# Patient Record
Sex: Male | Born: 1945
Health system: Southern US, Community
[De-identification: ages and names within clinical notes are randomized; demographics above are authoritative.]

## PROBLEM LIST (undated history)

## (undated) DIAGNOSIS — M1990 Unspecified osteoarthritis, unspecified site: Secondary | ICD-10-CM

## (undated) DIAGNOSIS — Z86010 Personal history of colonic polyps: Secondary | ICD-10-CM

## (undated) DIAGNOSIS — K5730 Diverticulosis of large intestine without perforation or abscess without bleeding: Secondary | ICD-10-CM

## (undated) DIAGNOSIS — E785 Hyperlipidemia, unspecified: Secondary | ICD-10-CM

## (undated) DIAGNOSIS — F528 Other sexual dysfunction not due to a substance or known physiological condition: Secondary | ICD-10-CM

## (undated) DIAGNOSIS — I10 Essential (primary) hypertension: Secondary | ICD-10-CM

## (undated) DIAGNOSIS — N400 Enlarged prostate without lower urinary tract symptoms: Secondary | ICD-10-CM

## (undated) HISTORY — DX: Personal history of colonic polyps: Z86.010

## (undated) HISTORY — DX: Other sexual dysfunction not due to a substance or known physiological condition: F52.8

## (undated) HISTORY — PX: CERVICAL SPINE SURGERY: SHX589

## (undated) HISTORY — PX: ROTATOR CUFF REPAIR: SHX139

## (undated) HISTORY — DX: Diverticulosis of large intestine without perforation or abscess without bleeding: K57.30

## (undated) HISTORY — PX: TEMPOROMANDIBULAR JOINT SURGERY: SHX35

## (undated) HISTORY — PX: VASECTOMY: SHX75

## (undated) HISTORY — DX: Unspecified osteoarthritis, unspecified site: M19.90

## (undated) HISTORY — DX: Hyperlipidemia, unspecified: E78.5

## (undated) HISTORY — PX: TONSILLECTOMY: SHX5217

## (undated) HISTORY — DX: Essential (primary) hypertension: I10

## (undated) HISTORY — DX: Benign prostatic hyperplasia without lower urinary tract symptoms: N40.0

---

## 2000-02-11 ENCOUNTER — Encounter (INDEPENDENT_AMBULATORY_CARE_PROVIDER_SITE_OTHER): Payer: Self-pay | Admitting: Specialist

## 2000-02-11 ENCOUNTER — Ambulatory Visit (HOSPITAL_COMMUNITY): Admission: RE | Admit: 2000-02-11 | Discharge: 2000-02-11 | Payer: Self-pay | Admitting: Gastroenterology

## 2001-08-05 ENCOUNTER — Ambulatory Visit (HOSPITAL_BASED_OUTPATIENT_CLINIC_OR_DEPARTMENT_OTHER): Admission: RE | Admit: 2001-08-05 | Discharge: 2001-08-06 | Payer: Self-pay | Admitting: Orthopedic Surgery

## 2004-01-25 ENCOUNTER — Ambulatory Visit (HOSPITAL_BASED_OUTPATIENT_CLINIC_OR_DEPARTMENT_OTHER): Admission: RE | Admit: 2004-01-25 | Discharge: 2004-01-25 | Payer: Self-pay | Admitting: Internal Medicine

## 2004-11-29 ENCOUNTER — Ambulatory Visit: Payer: Self-pay | Admitting: Internal Medicine

## 2004-12-10 ENCOUNTER — Ambulatory Visit: Payer: Self-pay | Admitting: Internal Medicine

## 2006-01-28 ENCOUNTER — Ambulatory Visit: Payer: Self-pay | Admitting: Internal Medicine

## 2006-01-28 LAB — CONVERTED CEMR LAB
BUN: 20 mg/dL (ref 6–23)
Basophils Absolute: 0 10*3/uL (ref 0.0–0.1)
CO2: 28 meq/L (ref 19–32)
Calcium: 9.2 mg/dL (ref 8.4–10.5)
Chloride: 109 meq/L (ref 96–112)
Chol/HDL Ratio, serum: 5.1
Cholesterol: 149 mg/dL (ref 0–200)
Creatinine, Ser: 1.1 mg/dL (ref 0.4–1.5)
Hemoglobin: 14.9 g/dL (ref 13.0–17.0)
LDL Cholesterol: 110 mg/dL — ABNORMAL HIGH (ref 0–99)
Lymphocytes Relative: 19.3 % (ref 12.0–46.0)
MCHC: 33.4 g/dL (ref 30.0–36.0)
MCV: 88.8 fL (ref 78.0–100.0)
Monocytes Absolute: 0.4 10*3/uL (ref 0.2–0.7)
Monocytes Relative: 8.8 % (ref 3.0–11.0)
Neutrophils Relative %: 70.3 % (ref 43.0–77.0)
Potassium: 4.4 meq/L (ref 3.5–5.1)
TSH: 2.38 microintl units/mL (ref 0.35–5.50)
Triglyceride fasting, serum: 48 mg/dL (ref 0–149)

## 2006-02-04 ENCOUNTER — Ambulatory Visit: Payer: Self-pay | Admitting: Internal Medicine

## 2006-04-22 ENCOUNTER — Ambulatory Visit: Payer: Self-pay | Admitting: Internal Medicine

## 2006-06-25 ENCOUNTER — Encounter: Payer: Self-pay | Admitting: Internal Medicine

## 2006-09-22 ENCOUNTER — Ambulatory Visit (HOSPITAL_COMMUNITY): Admission: RE | Admit: 2006-09-22 | Discharge: 2006-09-23 | Payer: Self-pay | Admitting: Neurological Surgery

## 2006-10-22 ENCOUNTER — Encounter: Payer: Self-pay | Admitting: Internal Medicine

## 2006-11-19 ENCOUNTER — Encounter: Payer: Self-pay | Admitting: Internal Medicine

## 2006-12-12 ENCOUNTER — Encounter: Payer: Self-pay | Admitting: Internal Medicine

## 2007-01-28 ENCOUNTER — Ambulatory Visit: Payer: Self-pay | Admitting: Internal Medicine

## 2007-01-28 LAB — CONVERTED CEMR LAB
Bilirubin, Direct: 0.2 mg/dL (ref 0.0–0.3)
CO2: 30 meq/L (ref 19–32)
Calcium: 9.2 mg/dL (ref 8.4–10.5)
Cholesterol: 143 mg/dL (ref 0–200)
Eosinophils Absolute: 0.1 10*3/uL (ref 0.0–0.6)
GFR calc Af Amer: 99 mL/min
GFR calc non Af Amer: 82 mL/min
Glucose, Bld: 82 mg/dL (ref 70–99)
HCT: 44.1 % (ref 39.0–52.0)
LDL Cholesterol: 109 mg/dL — ABNORMAL HIGH (ref 0–99)
MCHC: 33.8 g/dL (ref 30.0–36.0)
Monocytes Absolute: 0.5 10*3/uL (ref 0.2–0.7)
Monocytes Relative: 9.2 % (ref 3.0–11.0)
Neutro Abs: 3.2 10*3/uL (ref 1.4–7.7)
Neutrophils Relative %: 65.7 % (ref 43.0–77.0)
PSA: 1.87 ng/mL (ref 0.10–4.00)
Potassium: 4.5 meq/L (ref 3.5–5.1)
RBC: 5 M/uL (ref 4.22–5.81)
RDW: 12.5 % (ref 11.5–14.6)
Sodium: 147 meq/L — ABNORMAL HIGH (ref 135–145)
TSH: 2.17 microintl units/mL (ref 0.35–5.50)
Total CHOL/HDL Ratio: 6.1
Total Protein: 6.1 g/dL (ref 6.0–8.3)

## 2007-02-05 ENCOUNTER — Telehealth: Payer: Self-pay | Admitting: Internal Medicine

## 2007-02-06 DIAGNOSIS — Z860100 Personal history of colon polyps, unspecified: Secondary | ICD-10-CM

## 2007-02-06 DIAGNOSIS — M1990 Unspecified osteoarthritis, unspecified site: Secondary | ICD-10-CM

## 2007-02-06 DIAGNOSIS — K5730 Diverticulosis of large intestine without perforation or abscess without bleeding: Secondary | ICD-10-CM

## 2007-02-06 DIAGNOSIS — N400 Enlarged prostate without lower urinary tract symptoms: Secondary | ICD-10-CM

## 2007-02-06 HISTORY — DX: Benign prostatic hyperplasia without lower urinary tract symptoms: N40.0

## 2007-02-06 HISTORY — DX: Personal history of colonic polyps: Z86.010

## 2007-02-06 HISTORY — DX: Unspecified osteoarthritis, unspecified site: M19.90

## 2007-02-06 HISTORY — DX: Personal history of colon polyps, unspecified: Z86.0100

## 2007-02-06 HISTORY — DX: Diverticulosis of large intestine without perforation or abscess without bleeding: K57.30

## 2007-02-27 ENCOUNTER — Ambulatory Visit: Payer: Self-pay | Admitting: Internal Medicine

## 2007-06-04 ENCOUNTER — Telehealth: Payer: Self-pay | Admitting: Internal Medicine

## 2007-10-29 ENCOUNTER — Telehealth: Payer: Self-pay | Admitting: Internal Medicine

## 2007-12-22 ENCOUNTER — Telehealth: Payer: Self-pay | Admitting: Internal Medicine

## 2008-02-12 ENCOUNTER — Ambulatory Visit: Payer: Self-pay | Admitting: Internal Medicine

## 2008-02-12 LAB — CONVERTED CEMR LAB
ALT: 21 units/L (ref 0–53)
AST: 19 units/L (ref 0–37)
Albumin: 4 g/dL (ref 3.5–5.2)
BUN: 14 mg/dL (ref 6–23)
Basophils Relative: 0.1 % (ref 0.0–3.0)
Bilirubin Urine: NEGATIVE
CO2: 29 meq/L (ref 19–32)
Calcium: 8.8 mg/dL (ref 8.4–10.5)
Chloride: 106 meq/L (ref 96–112)
Cholesterol: 159 mg/dL (ref 0–200)
Creatinine, Ser: 1 mg/dL (ref 0.4–1.5)
Eosinophils Absolute: 0.1 10*3/uL (ref 0.0–0.7)
Eosinophils Relative: 2 % (ref 0.0–5.0)
Glucose, Urine, Semiquant: NEGATIVE
Hemoglobin: 14.7 g/dL (ref 13.0–17.0)
MCV: 87.9 fL (ref 78.0–100.0)
Neutro Abs: 4.2 10*3/uL (ref 1.4–7.7)
Neutrophils Relative %: 71.8 % (ref 43.0–77.0)
PSA: 2.41 ng/mL (ref 0.10–4.00)
Protein, U semiquant: NEGATIVE
RBC: 4.78 M/uL (ref 4.22–5.81)
Specific Gravity, Urine: 1.015
TSH: 2.93 microintl units/mL (ref 0.35–5.50)
VLDL: 20 mg/dL (ref 0–40)
WBC Urine, dipstick: NEGATIVE
WBC: 5.9 10*3/uL (ref 4.5–10.5)
pH: 6.5

## 2008-02-22 ENCOUNTER — Ambulatory Visit: Payer: Self-pay | Admitting: Internal Medicine

## 2008-02-22 DIAGNOSIS — E785 Hyperlipidemia, unspecified: Secondary | ICD-10-CM

## 2008-02-22 HISTORY — DX: Hyperlipidemia, unspecified: E78.5

## 2008-03-30 ENCOUNTER — Telehealth: Payer: Self-pay | Admitting: *Deleted

## 2008-03-31 ENCOUNTER — Telehealth: Payer: Self-pay | Admitting: Internal Medicine

## 2008-09-28 ENCOUNTER — Telehealth: Payer: Self-pay | Admitting: Internal Medicine

## 2009-02-02 ENCOUNTER — Telehealth: Payer: Self-pay | Admitting: Internal Medicine

## 2009-02-17 ENCOUNTER — Ambulatory Visit: Payer: Self-pay | Admitting: Internal Medicine

## 2009-02-17 LAB — CONVERTED CEMR LAB
ALT: 19 units/L (ref 0–53)
Basophils Relative: 0.5 % (ref 0.0–3.0)
Bilirubin, Direct: 0.1 mg/dL (ref 0.0–0.3)
Chloride: 108 meq/L (ref 96–112)
Creatinine, Ser: 1.1 mg/dL (ref 0.4–1.5)
Eosinophils Relative: 2 % (ref 0.0–5.0)
HCT: 40.6 % (ref 39.0–52.0)
Hemoglobin: 14 g/dL (ref 13.0–17.0)
LDL Cholesterol: 93 mg/dL (ref 0–99)
MCV: 90.6 fL (ref 78.0–100.0)
Monocytes Absolute: 0.3 10*3/uL (ref 0.1–1.0)
Neutrophils Relative %: 63.1 % (ref 43.0–77.0)
Nitrite: NEGATIVE
PSA: 0.75 ng/mL (ref 0.10–4.00)
Potassium: 3.6 meq/L (ref 3.5–5.1)
RBC: 4.48 M/uL (ref 4.22–5.81)
Sodium: 141 meq/L (ref 135–145)
Total CHOL/HDL Ratio: 4
Total Protein: 6.2 g/dL (ref 6.0–8.3)
Urobilinogen, UA: 0.2
WBC Urine, dipstick: NEGATIVE
WBC: 3.5 10*3/uL — ABNORMAL LOW (ref 4.5–10.5)

## 2009-02-23 ENCOUNTER — Ambulatory Visit: Payer: Self-pay | Admitting: Internal Medicine

## 2009-02-23 DIAGNOSIS — F528 Other sexual dysfunction not due to a substance or known physiological condition: Secondary | ICD-10-CM

## 2009-02-23 HISTORY — DX: Other sexual dysfunction not due to a substance or known physiological condition: F52.8

## 2009-03-08 ENCOUNTER — Encounter: Payer: Self-pay | Admitting: Internal Medicine

## 2009-06-13 ENCOUNTER — Telehealth: Payer: Self-pay | Admitting: Internal Medicine

## 2009-06-20 ENCOUNTER — Ambulatory Visit: Payer: Self-pay | Admitting: Internal Medicine

## 2009-06-20 DIAGNOSIS — J019 Acute sinusitis, unspecified: Secondary | ICD-10-CM

## 2010-02-05 ENCOUNTER — Telehealth: Payer: Self-pay | Admitting: Internal Medicine

## 2010-02-27 ENCOUNTER — Ambulatory Visit: Payer: Self-pay | Admitting: Internal Medicine

## 2010-02-27 LAB — CONVERTED CEMR LAB
ALT: 16 units/L (ref 0–53)
Alkaline Phosphatase: 43 units/L (ref 39–117)
Basophils Relative: 0.4 % (ref 0.0–3.0)
Bilirubin, Direct: 0.1 mg/dL (ref 0.0–0.3)
Blood in Urine, dipstick: NEGATIVE
Calcium: 8.3 mg/dL — ABNORMAL LOW (ref 8.4–10.5)
Creatinine, Ser: 1.1 mg/dL (ref 0.4–1.5)
Eosinophils Absolute: 0.1 10*3/uL (ref 0.0–0.7)
Eosinophils Relative: 2.2 % (ref 0.0–5.0)
HDL: 22.2 mg/dL — ABNORMAL LOW (ref >39.00)
Ketones, urine, test strip: NEGATIVE
LDL Cholesterol: 80 mg/dL (ref 0–99)
Lymphocytes Relative: 30.8 % (ref 12.0–46.0)
Neutrophils Relative %: 58.2 % (ref 43.0–77.0)
Nitrite: NEGATIVE
RBC: 4.73 M/uL (ref 4.22–5.81)
Total Bilirubin: 0.8 mg/dL (ref 0.3–1.2)
Total CHOL/HDL Ratio: 5
Total Protein: 6 g/dL (ref 6.0–8.3)
Triglycerides: 58 mg/dL (ref 0.0–149.0)
Urobilinogen, UA: 0.2
VLDL: 11.6 mg/dL (ref 0.0–40.0)
WBC: 4.1 10*3/uL — ABNORMAL LOW (ref 4.5–10.5)

## 2010-03-06 ENCOUNTER — Encounter: Payer: Self-pay | Admitting: Internal Medicine

## 2010-03-06 ENCOUNTER — Ambulatory Visit
Admission: RE | Admit: 2010-03-06 | Discharge: 2010-03-06 | Payer: Self-pay | Source: Home / Self Care | Attending: Internal Medicine | Admitting: Internal Medicine

## 2010-03-29 ENCOUNTER — Telehealth: Payer: Self-pay | Admitting: Internal Medicine

## 2010-04-03 NOTE — Letter (Signed)
Summary: [facility]  [facility]   Imported By: Maryln Gottron 08/18/2009 12:34:13  _____________________________________________________________________  External Attachment:    Type:   Image     Comment:   External Document

## 2010-04-03 NOTE — Medication Information (Signed)
Summary: Prior Authorization Request and Approval for Androgel/[facility]  Prior Authorization Request and Approval for Androgel/[facility]   Imported By: Maryln Gottron 03/14/2009 11:30:27  _____________________________________________________________________  External Attachment:    Type:   Image     Comment:   External Document

## 2010-04-03 NOTE — Progress Notes (Signed)
Summary: sinus infection - make appt  Phone Note Call from Patient Call back at [phone]   Caller: Patient----voicemail Summary of Call: Has a sinus infection x 1 month. Call relief in to [facility] on [address]. Initial call taken by: Warnell Forester,  June 13, 2009 2:23 PM  Follow-up for Phone Call        needs ROV to assess; OK for patient to try sudafed, nasal irrigation and short term afrin Follow-up by: Gordy Savers  MD,  June 13, 2009 5:21 PM  Additional Follow-up for Phone Call Additional follow up Details #1::        attempt to call - ans mach - LMTCB and make appt - will need to see - can use otc meds as indicated per Dr. Williams Che Additional Follow-up by: Duard Brady LPN,  June 14, 2009 9:50 AM

## 2010-04-03 NOTE — Progress Notes (Signed)
Summary: REQUEST FOR RX........?  Phone Note Call from Patient   Caller: Patient Summary of Call: Pt would like to have a Rx for Viagra sent to  [facility] [address]..... Pt can be reached at  [phone] with any questions or concerns.  Initial call taken by: Debbra Riding,  February 05, 2010 12:12 PM  Follow-up for Phone Call        viagra 100  6   RF 6  1/2-1 daily as directed Follow-up by: Gordy Savers  MD,  February 05, 2010 12:25 PM  Additional Follow-up for Phone Call Additional follow up Details #1::        change to med list done , new rx [facility]ed to [facility]. pt aware. KIK Additional Follow-up by: Duard Brady LPN,  February 05, 2010 3:22 PM    New/Updated Medications: VIAGRA 100 MG TABS (SILDENAFIL CITRATE) 1/2 to 1 tab once daily as directed Prescriptions: VIAGRA 100 MG TABS (SILDENAFIL CITRATE) 1/2 to 1 tab once daily as directed  #6 x 6   Entered by:   Duard Brady LPN   Authorized by:   Gordy Savers  MD   Signed by:   Duard Brady LPN on 36/64/4034   Method used:   Faxed to ...       [facility] [address]* (retail)       [address]       [place], [place]  [address]       Ph: [phone]       Fax: [phone]   RxID:   [id number]

## 2010-04-03 NOTE — Assessment & Plan Note (Signed)
Summary: sinuses//ccm   Vital Signs:  Patient profile:   65 year old male Weight:      185 pounds Temp:     98.7 degrees F oral BP sitting:   92 / 60  (left arm) Cuff size:   regular  Vitals Entered By: Duard Brady LPN (June 20, 2009 3:44 PM) CC: pt c/o achy, pain sinuses, tight cough Is Patient Diabetic? No   CC:  pt c/o achy, pain sinuses, and tight cough.  History of Present Illness: 65 year old patient who has a history of allergic rhinitis, dyslipidemia osteoarthritis and EDfor the past month.  His head significant sinus congestion, intermittent chest congestion, and productive cough.  He has had purulent sinus drainage, yielding green sputum yesterday.  He felt quite achy and feverish and felt unwell as been no documented fever.  He has been using fluticasone on a regular basis.  Denies any wheezing, shortness of breath or chest pain  Preventive Screening-Counseling & Management  Alcohol-Tobacco     Smoking Status: never  Allergies: 1)  ! Sulf-10  Past History:  Past Medical History: Reviewed history from 02/23/2009 and no changes required. Colonic polyps, hx of Osteoarthritis Benign prostatic hypertrophy Diverticulosis, colon Hyperlipidemia ED  Review of Systems       The patient complains of anorexia, hoarseness, and prolonged cough.  The patient denies fever, weight loss, weight gain, vision loss, decreased hearing, chest pain, syncope, dyspnea on exertion, peripheral edema, headaches, hemoptysis, abdominal pain, melena, hematochezia, severe indigestion/heartburn, hematuria, incontinence, genital sores, muscle weakness, suspicious skin lesions, transient blindness, difficulty walking, depression, unusual weight change, abnormal bleeding, enlarged lymph nodes, angioedema, breast masses, and testicular masses.    Physical Exam  General:  Well-developed,well-nourished,in no acute distress; alert,appropriate and cooperative throughout examination Head:   Normocephalic and atraumatic without obvious abnormalities. No apparent alopecia or balding. mild maxillary sinus tenderness Eyes:  No corneal or conjunctival inflammation noted. EOMI. Perrla. Funduscopic exam benign, without hemorrhages, exudates or papilledema. Vision grossly normal. Ears:  External ear exam shows no significant lesions or deformities.  Otoscopic examination reveals clear canals, tympanic membranes are intact bilaterally without bulging, retraction, inflammation or discharge. Hearing is grossly normal bilaterally. Mouth:  Oral mucosa and oropharynx without lesions or exudates.  Teeth in good repair. Lungs:  few scattered rhonchi Heart:  Normal rate and regular rhythm. S1 and S2 normal without gallop, murmur, click, rub or other extra sounds. Abdomen:  Bowel sounds positive,abdomen soft and non-tender without masses, organomegaly or hernias noted.   Impression & Recommendations:  Problem # 1:  SINUSITIS- ACUTE-NOS (ICD-461.9)  His updated medication list for this problem includes:    Fluticasone Propionate 50 Mcg/act Susp (Fluticasone propionate) ..... Use two times a day  Problem # 2:  ERECTILE DYSFUNCTION, NON-ORGANIC, MILD (ICD-302.72)  His updated medication list for this problem includes:    Cialis 2.5 Mg Tabs (Tadalafil) ..... One tablet daily    Cialis 20 Mg Tabs (Tadalafil) ..... One daily as directed  Problem # 3:  OSTEOARTHRITIS (ICD-715.90)  His updated medication list for this problem includes:    Naproxen Dr 500 Mg Tbec (Naproxen) .Marland Kitchen... 1 two times a day  Complete Medication List: 1)  Fluticasone Propionate 50 Mcg/act Susp (Fluticasone propionate) .... Use two times a day 2)  Prozac 20 Mg Caps (Fluoxetine hcl) .Marland Kitchen.. 1 once daily 3)  Flomax 0.4 Mg Cp24 (Tamsulosin hcl) .Marland Kitchen.. 1 once daily 4)  Naproxen Dr 500 Mg Tbec (Naproxen) .Marland Kitchen.. 1 two times a day 5)  Niaspan 1000 Mg Cr-tabs (Niacin (antihyperlipidemic)) .... One at bedtime 6)  Cialis 2.5 Mg Tabs  (Tadalafil) .... One tablet daily 7)  Androgel 50 Mg/5gm Gel (Testosterone) .... Use q am 8)  Cialis 20 Mg Tabs (Tadalafil) .... One daily as directed  Patient Instructions: 1)  MUCINEX D one every 12 hours 2)  AVELOX ONE DAILY 3)  Get plenty of rest, drink lots of clear liquids, and use Tylenol or Ibuprofen for fever and comfort. Return in 7-10 days if you're not better:sooner if you're feeling worse. Prescriptions: CIALIS 2.5 MG TABS (TADALAFIL) one tablet daily  #90 x 6   Entered and Authorized by:   Gordy Savers  MD   Signed by:   Gordy Savers  MD on 06/20/2009   Method used:   Print then Give to Patient   RxID:   [id number] CIALIS 20 MG TABS (TADALAFIL) one daily as directed  #12 x 12   Entered and Authorized by:   Gordy Savers  MD   Signed by:   Gordy Savers  MD on 06/20/2009   Method used:   Print then Give to Patient   RxID:   [id number] CIALIS 20 MG TABS (TADALAFIL) one daily as directed  #12 x 12   Entered and Authorized by:   Gordy Savers  MD   Signed by:   Gordy Savers  MD on 06/20/2009   Method used:   Electronically to        [facility]* (mail-order)             ,          Ph: [phone]       Fax: [phone]   RxID:   [id number] CIALIS 2.5 MG TABS (TADALAFIL) one tablet daily  #90 x 6   Entered and Authorized by:   Gordy Savers  MD   Signed by:   Gordy Savers  MD on 06/20/2009   Method used:   Electronically to        [facility]* (mail-order)             ,          Ph: [phone]       Fax: [phone]   RxID:   [id number]

## 2010-04-05 NOTE — Assessment & Plan Note (Signed)
Summary: CPX // RS   Vital Signs:  Patient profile:   66 year old male Height:      68 inches Weight:      185 pounds BMI:     28.23 O2 Sat:      97 % on Room air Temp:     98.9 degrees F oral Pulse (ortho):   80 / minute BP sitting:   122 / 80  (left arm) Cuff size:   regular  Vitals Entered By: Duard Brady LPN (March 06, 2010 1:54 PM)  O2 Flow:  Room air CC: cpx - doing well - DOT cpx ppwk Is Patient Diabetic? No  Vision Screening:Left eye with correction: 20 / 20 Right eye with correction: 20 / 20 Both eyes with correction: 20 / 20        Vision Entered By: Duard Brady LPN (March 06, 2010 2:05 PM)   CC:  cpx - doing well - DOT cpx ppwk.  History of Present Illness:  65 year old patient who is seen today for a health-maintenance examination and DOT physical.  He is doing quite well.  He has a history of obscure Ronis BPH mild dyslipidemia.  He has erectile dysfunction.  No new concerns or complaints.  Allergies: 1)  ! Sulf-10  Past History:  Past Medical History: Reviewed history from 02/23/2009 and no changes required. Colonic polyps, hx of Osteoarthritis Benign prostatic hypertrophy Diverticulosis, colon Hyperlipidemia ED  Past Surgical History: Reviewed history from 02/22/2008 and no changes required. Rotator cuff repair 2003 Dr. Eulah Pont Tonsillectomy Vasectomy reversal TMJ surg x2 status post the cervical spine surgery July 2008 C5-6 level  Dr. Danielle Dess  colonoscopy April 2008  Family History: Reviewed history from 02/22/2008 and no changes required. father died age 69, lung cancer mother died within the past month at age 41 of heart failure.  History of COPD, status post pacemaker  One brother is in good health  Social History: Reviewed history from 02/23/2009 and no changes required. Married One daughter, 4 sons.  Review of Systems  The patient denies anorexia, fever, weight loss, weight gain, vision loss, decreased  hearing, hoarseness, chest pain, syncope, dyspnea on exertion, peripheral edema, prolonged cough, headaches, hemoptysis, abdominal pain, melena, hematochezia, severe indigestion/heartburn, hematuria, incontinence, genital sores, muscle weakness, suspicious skin lesions, transient blindness, difficulty walking, depression, unusual weight change, abnormal bleeding, enlarged lymph nodes, angioedema, breast masses, and testicular masses.    Physical Exam  General:  Well-developed,well-nourished,in no acute distress; alert,appropriate and cooperative throughout examination Head:  Normocephalic and atraumatic without obvious abnormalities. No apparent alopecia or balding. Eyes:  No corneal or conjunctival inflammation noted. EOMI. Perrla. Funduscopic exam benign, without hemorrhages, exudates or papilledema. Vision grossly normal. Nose:  External nasal examination shows no deformity or inflammation. Nasal mucosa are pink and moist without lesions or exudates. Mouth:  Oral mucosa and oropharynx without lesions or exudates.  Teeth in good repair. Neck:  No deformities, masses, or tenderness noted. Chest Wall:  No deformities, masses, tenderness or gynecomastia noted. Breasts:  No masses or gynecomastia noted Lungs:  Normal respiratory effort, chest expands symmetrically. Lungs are clear to auscultation, no crackles or wheezes. Heart:  Normal rate and regular rhythm. S1 and S2 normal without gallop, murmur, click, rub or other extra sounds. Abdomen:  Bowel sounds positive,abdomen soft and non-tender without masses, organomegaly or hernias noted. Genitalia:  Testes bilaterally descended without nodularity, tenderness or masses. No scrotal masses or lesions. No penis lesions or urethral discharge. Prostate:  2+  enlarged.  2+ enlarged.   Msk:  No deformity or scoliosis noted of thoracic or lumbar spine.   Pulses:  R and L carotid,radial,femoral,dorsalis pedis and posterior tibial pulses are full and equal  bilaterally Extremities:  No clubbing, cyanosis, edema, or deformity noted with normal full range of motion of all joints.   Neurologic:  No cranial nerve deficits noted. Station and gait are normal. Plantar reflexes are down-going bilaterally. DTRs are symmetrical throughout. Sensory, motor and coordinative functions appear intact. Cervical Nodes:  No lymphadenopathy noted Axillary Nodes:  No palpable lymphadenopathy Inguinal Nodes:  No significant adenopathy Psych:  Cognition and judgment appear intact. Alert and cooperative with normal attention span and concentration. No apparent delusions, illusions, hallucinations   Impression & Recommendations:  Problem # 1:  PREVENTIVE HEALTH CARE (ICD-V70.0)  Orders: EKG w/ Interpretation (93000)  Complete Medication List: 1)  Fluticasone Propionate 50 Mcg/act Susp (Fluticasone propionate) .... Use two times a day 2)  Prozac 20 Mg Caps (Fluoxetine hcl) .Marland Kitchen.. 1 once daily 3)  Flomax 0.4 Mg Cp24 (Tamsulosin hcl) .Marland Kitchen.. 1 once daily 4)  Naproxen Dr 500 Mg Tbec (Naproxen) .Marland Kitchen.. 1 two times a day 5)  Niaspan 1000 Mg Cr-tabs (Niacin (antihyperlipidemic)) .... One at bedtime 6)  Androgel 50 Mg/5gm Gel (Testosterone) .... Use q am 7)  Viagra 100 Mg Tabs (Sildenafil citrate) .... 1/2 to 1 tab once daily as directed  Patient Instructions: 1)  Please schedule a follow-up appointment in 1 year. 2)  Limit your Sodium (Salt). 3)  It is important that you exercise regularly at least 20 minutes 5 times a week. If you develop chest pain, have severe difficulty breathing, or feel very tired , stop exercising immediately and seek medical attention. Prescriptions: VIAGRA 100 MG TABS (SILDENAFIL CITRATE) 1/2 to 1 tab once daily as directed  #6 x 6   Entered and Authorized by:   Gordy Savers  MD   Signed by:   Gordy Savers  MD on 03/06/2010   Method used:   Electronically to        [facility]* (retail)             ,          Ph: [phone]        Fax: [phone]   RxID:   [id number] NIASPAN 1000 MG CR-TABS (NIACIN (ANTIHYPERLIPIDEMIC)) one at bedtime  #90 x 6   Entered and Authorized by:   Gordy Savers  MD   Signed by:   Gordy Savers  MD on 03/06/2010   Method used:   Electronically to        [facility]* (retail)             ,          Ph: [phone]       Fax: [phone]   RxID:   [id number] FLOMAX 0.4 MG  CP24 (TAMSULOSIN HCL) 1 once daily  #90 x 6   Entered and Authorized by:   Gordy Savers  MD   Signed by:   Gordy Savers  MD on 03/06/2010   Method used:   Electronically to        [facility]* (retail)             ,          Ph: [phone]       Fax: [phone]   RxID:   [id number] PROZAC 20 MG  CAPS (FLUOXETINE  HCL) 1 once daily  #90 x 6   Entered and Authorized by:   Gordy Savers  MD   Signed by:   Gordy Savers  MD on 03/06/2010   Method used:   Electronically to        [facility]* (retail)             ,          Ph: [phone]       Fax: [phone]   RxID:   [id number] FLUTICASONE PROPIONATE 50 MCG/ACT SUSP (FLUTICASONE PROPIONATE) use two times a day  #3 x 6   Entered and Authorized by:   Gordy Savers  MD   Signed by:   Gordy Savers  MD on 03/06/2010   Method used:   Electronically to        [facility]* (retail)             ,          Ph: [phone]       Fax: [phone]   RxID:   [id number] VIAGRA 100 MG TABS (SILDENAFIL CITRATE) 1/2 to 1 tab once daily as directed  #6 x 6   Entered and Authorized by:   Gordy Savers  MD   Signed by:   Gordy Savers  MD on 03/06/2010   Method used:   Print then Give to Patient   RxID:   [id number] ANDROGEL 50 MG/5GM GEL (TESTOSTERONE) use q am  #30 x 4   Entered and Authorized by:   Gordy Savers  MD   Signed by:   Gordy Savers  MD on 03/06/2010   Method used:   Print then Give to Patient   RxID:   [id number] NIASPAN  1000 MG CR-TABS (NIACIN (ANTIHYPERLIPIDEMIC)) one at bedtime  #90 x 6   Entered and Authorized by:   Gordy Savers  MD   Signed by:   Gordy Savers  MD on 03/06/2010   Method used:   Print then Give to Patient   RxID:   [id number] FLOMAX 0.4 MG  CP24 (TAMSULOSIN HCL) 1 once daily  #90 x 6   Entered and Authorized by:   Gordy Savers  MD   Signed by:   Gordy Savers  MD on 03/06/2010   Method used:   Print then Give to Patient   RxID:   [id number] PROZAC 20 MG  CAPS (FLUOXETINE HCL) 1 once daily  #90 x 6   Entered and Authorized by:   Gordy Savers  MD   Signed by:   Gordy Savers  MD on 03/06/2010   Method used:   Print then Give to Patient   RxID:   [id number] FLUTICASONE PROPIONATE 50 MCG/ACT SUSP (FLUTICASONE PROPIONATE) use two times a day  #3 x 6   Entered and Authorized by:   Gordy Savers  MD   Signed by:   Gordy Savers  MD on 03/06/2010   Method used:   Print then Give to Patient   RxID:   [id number]  A1 no and a, and a and is a we are in.  No and in no and a review of and and a half and a one and and a low of in length and the in the  Orders Added: 1)  EKG w/ Interpretation [93000] 2)  Est. Patient 40-64 years [phone]

## 2010-04-05 NOTE — Progress Notes (Signed)
Summary: 90 day rx's for androgel,cialis  Phone Note Call from Patient   Caller: Patient Call For: Harold Savers  MD Summary of Call: needs 90 day rx for andorgel pump 1/62% and cialis , for mail order  call when ready , will pick [phone] Initial call taken by: Duard Brady LPN,  March 29, 2010 2:33 PM  Follow-up for Phone Call        pt aware ready for pick up   KIK Follow-up by: Duard Brady LPN,  March 30, 2010 8:24 AM    New/Updated Medications: ANDROGEL PUMP 20.25 MG/ACT (1.62%) GEL (TESTOSTERONE) 3 pumps daily CIALIS 20 MG TABS (TADALAFIL) one daily as needed Prescriptions: CIALIS 20 MG TABS (TADALAFIL) one daily as needed  #12 x 6   Entered and Authorized by:   Harold Savers  MD   Signed by:   Harold Savers  MD on 03/29/2010   Method used:   Print then Give to Patient   RxID:   [id number] ANDROGEL PUMP 20.25 MG/ACT (1.62%) GEL (TESTOSTERONE) 3 pumps daily  #3 x 6   Entered and Authorized by:   Harold Savers  MD   Signed by:   Harold Savers  MD on 03/29/2010   Method used:   Print then Give to Patient   RxID:   [id number]

## 2010-04-10 ENCOUNTER — Telehealth: Payer: Self-pay | Admitting: *Deleted

## 2010-04-10 NOTE — Telephone Encounter (Signed)
Given pt Dr Charm Rings recommendations.

## 2010-04-10 NOTE — Telephone Encounter (Signed)
Get plenty of rest, Drink lots of  clear liquids, and use Tylenol or ibuprofen for fever and discomfort.  Suggest Mucinex D; ROV prior to weekend if no improvement

## 2010-04-10 NOTE — Telephone Encounter (Signed)
Pt is asking for a RX for sinus drainage........green and congestion.

## 2010-07-17 NOTE — Op Note (Signed)
NAME:  Harold Wilson, Harold Wilson NO.:  [id number]   MEDICAL RECORD NO.:  [id number]          PATIENT TYPE:  INP   LOCATION:  3172                         FACILITY:  [facility]   PHYSICIAN:  Stefani Dama, M.D.  DATE OF BIRTH:  1945/08/11   DATE OF PROCEDURE:  09/22/2006  DATE OF DISCHARGE:                               OPERATIVE REPORT   PREOPERATIVE DIAGNOSIS:  Cervical spondylosis, with myelopathy and  radiculopathy, C4-5 and C5-6.   POSTOPERATIVE DIAGNOSIS:  Cervical spondylosis, with myelopathy and  radiculopathy, C4-5 and C5-6.   PROCEDURE:  Anterior cervical decompression C4-5 and C5-6, arthrodesis  with structural allograft, alpha type plate fixation.   SURGEON:  Stefani Dama, M.D.   FIRST ASSISTANT:  Hewitt Shorts, M.D.   ANESTHESIA:  General endotracheal.   INDICATIONS:  Harold Wilson is a 65 year old individual who has had  significant neck, shoulder, and arm pain, with weakness on the left  side.  He has severe spondylitic disease at multiple levels, the worst  of which are at C4-5 and C5-6.  He has been advised regarding surgical  decompression and arthrodesis of these two levels.   PROCEDURE:  The patient brought to the operating room and placed on the  table in the supine position.  After smooth induction of general  tracheal anesthesia, he was placed in 5 pounds of halter traction.  The  neck was scrubbed with alcohol and DuraPrep and draped in a sterile  fashion.  A transverse incision was made on the left side of the neck  and carried down to the platysma.  The plane between sternocleidomastoid  and the strap muscles was dissected bluntly, and the first identifiable  disc space was noted be that of C3-4 on a radiograph.  C4-5 and C5-6  were then uncovered using bipolar cautery.  The longus coli muscle was  stripped off either side of the midline, and a self-retaining retractor  was placed in the wound.  The disc space at C4-5 was then opened  by  removing large ventral osteophytes using a combination of curettes and  rongeurs.  The disc space was similarly then evacuated using curettes  and rongeurs, and a high-speed drill was used then to drill off the  inferior osteophytes from the body of C4 and uncinate process  hypertrophy from the superior portion of the body of C5.  The lateral  recesses were decompressed, and the central canal was decompressed, and  the posterior longitudinal ligament was taken up with a 2 mm Kerrison  punch.  This completed the decompression at this level.  Hemostasis was  achieved in the lateral tissues.  C5-6 was decompressed in a similar  fashion, and again prominent uncinate hypertrophy with redundant disc  material and thickened posterior longitudinal ligament was encountered  centrally.  Once this area was decompressed, an 8 mm transgraft was  shaped and sized to the appropriate dimensions using a high-speed bur.  It was filled with demineralized bone matrix and then placed into the  interspace at C5-6 first.  C4-5 was then fitted with a similar-sized  allograft with  demineralized bone matrix also.  A 37 mm standard size  Alphatec plate was then fitted with fixed-angle locking 4 x 14 mm screws  at C5, variable-angle screws at C4 and C6.  Final localizing radiograph  identified good position of the construct and the graft.  The wound was  checked for hemostasis.  Once  this was verified, then the platysma was closed 3-0 Vicryl in an  interrupted fashion.  3-0 Vicryl was used in the subcuticular tissues,  and a dry sterile dressing was placed on the skin.  The patient  tolerated the procedure well and was returned to the recovery in stable  condition.      Stefani Dama, M.D.  Electronically Signed     HJE/MEDQ  D:  09/22/2006  T:  09/22/2006  Job:  [id number]

## 2010-07-20 NOTE — Op Note (Signed)
[place]  Patient:    Harold Wilson, Harold Wilson Visit Number: [id number] MRN: [id number]          Service Type: DSU Location: [facility] Attending Physician:  Colbert Ewing Dictated by:   Loreta Ave, M.D. Proc. Date: 08/05/01 Admit Date:  08/05/2001 Discharge Date: 08/05/2001                             Operative Report  PREOPERATIVE DIAGNOSES:  Left shoulder chronic impingement, rotator cuff tear, longitudinal tearing biceps tendon with subluxation of bicipital groove. Degenerative joint disease of the acromioclavicular joint.  POSTOPERATIVE DIAGNOSES:  Left shoulder chronic impingement, rotator cuff tear, longitudinal tearing biceps tendon with subluxation of bicipital groove. Degenerative joint disease of the acromioclavicular joint.  OPERATIVE PROCEDURES:  Left shoulder examination under anesthesia arthroscopy with debridement of intraarticular portion of the biceps tendon.  Subacromial arthroscopic decompression with bursectomy.  Open repair of rotator cuff tear and interval tear.  __ repair biceps tendon subluxation with bioabsorbable anchor at bicipital groove.  Excision of distal clavicle.  SURGEON:  Loreta Ave, M.D.  ASSISTANT:  Arlys John D. Petrarca, P.A.-C.  ANESTHESIA:  General.  BLOOD LOSS:  Minimal.  SPECIMENS:  None.  CULTURES:  None.  COMPLICATIONS:  None.  DRESSING:  Soft compressive with shoulder immobilizer.  DESCRIPTION OF PROCEDURE:  The patient was brought to the operating room, placed on the operating table in supine position.  After adequate anesthesia had been obtained, the left shoulder was examined.  Full motion, good stability.  Placed in a beach chair position, prepped and draped in usual sterile fashion.  Three portals were created anterior, posterolateral, standard arthroscopic portals.  The shoulder was entered with a blunt obturator, distended, and inspected.  Articular cartilage, labrum,  looked good.  Biceps tendon was intact but with marked longitudinal tearing compromising a third of its integrity over the entire distance.  Subluxed out of the bicipital groove medially from soft tissue tearing at the anchor in front of the groove.  Interval tearing between the subscap tendon and supraspinatus from the lateral attachment all the way over to the glenoid. Attachment tearing supraspinatus tendon behind the bicipital groove over the anterior half in the crescent region.  Capsule and ligamentous structures were intact.  Cannula was redirected subacromially.  The full-thickness tear was appreciated.  Type 2-3 acromion.  Bursa was resected, cuff debrided. Acromioplasty to a type I acromion with the shaver and high-speed bur releasing the CA ligament.  Distal clavicle contributing to impingement with grade 4 changes.  Lateral cm sharply resected with shaver and high-speed bur. Adequacy of acromioplasty and decompression and clavicle excision confirmed viewing from all portals.  Instruments and fluid removed.  Deltoid splitting incision through the lateral portal.  Subacromial space accessed after the deltoid was split staying above the axillary nerve.  The interval tearing was repaired with Vicryl.  Bicipital groove was exposed.  Bioabsorbable anchor was placed at the anterior aspect of this.  Sutures were brought through the cuff and below firmly anchoring that down so that the tendon would not sublux. Tendon was inspected and although somewhat compromised, had enough integrity to retain this.  I then freshened up the margin of the supraspinatus tendon behind the bicipital groove.  Well captured with fiberwire suture.  Sewn down into a bony trough with drill holes placed through the humerus with a [employer].  Once these two were completed,  we then oversewed with tissue over the bicipital groove.  At completion, I had nice anchoring of the biceps within the groove  without subluxation.  Nice watertight closure of the cuff without undue tension when brought through full motion.  Adequacy of decompression confirmed digitally. The wound was irrigated.   Deltoid closed with Vicryl.  Skin and subcutaneous tissue with Vicryl.  Margins of the wound injected with Marcaine.  Portals closed with nylon and injected with Marcaine.  Sterile compressive dressing and shoulder immobilizer was applied. Anesthesia reversed, brought to recovery room.  Tolerated surgery well, no complications.Dictated by:   Loreta Ave, M.D. Attending Physician:  Colbert Ewing DD:  08/05/01 TD:  08/07/01 Job: [id number]

## 2010-07-20 NOTE — Assessment & Plan Note (Signed)
[facility]                            [place] OFFICE NOTE   NAME:Harold Wilson, Ausburn                      MRN:          [id number]  DATE:02/04/2006                            DOB:          11-13-1945    The patient is seen today for a wellness exam.   This 65 year old gentleman has a history of DJD, BPH.  He was diagnosed  with colonic polyps approximately 5 years ago, and is scheduled for  followup colonoscopy.  He is doing well today.  He has had a nice  benefit with Flomax.   OPERATIONS:  1. Rotator cuff surgery bilaterally.  2. A vasectomy reversal.  3. TMJ surgery x2.  4. A remote tonsillectomy.   MEDICAL REGIMEN:  1. Naproxen.  2. Prozac.  3. Flomax.  4. He was followed by Dr. Evelene Croon recently and is on Adderall.   FAMILY HISTORY:  Noncontributory.  Father died of lung cancer at 81.  One brother remains well.  Mother with a history of COPD, status post  pacemaker.   PHYSICAL EXAMINATION:  GENERAL:  A well-developed male in no acute  distress.  VITAL SIGNS:  Repeat blood pressure is 130/80, was high on arrival, and  usually is in a low normal range.  SKIN:  Negative, except for some areas of folliculitis over the  abdominal wall.  HEAD/NECK:  Normal fundi.  Ears, nose and throat clear.  Neck with no  bruits or adenopathy.  CHEST:  Clear.  CARDIOVASCULAR:  Normal heart sounds, no murmurs.  ABDOMEN:  Benign.  EXTERNAL GENITALIA:  Normal.  EXTREMITIES:  Negative with intact peripheral pulses.  RECTAL:  Prostate +2 to 3 enlarged and benign.  Stool heme negative.  NEUROLOGIC:  Negative.   IMPRESSION:  1. Degenerative joint disease.  2. Benign prostatic hypertrophy.   DISPOSITION:  His DOC forms were completed.  All his medicines were  refilled.  Will recheck in 6-12 months.     Gordy Savers, MD  Electronically Signed    PFK/MedQ  DD: 02/04/2006  DT: 02/05/2006  Job #: [id number]

## 2010-07-20 NOTE — Procedures (Signed)
NAME:  Harold Wilson, Harold Wilson NO.:  [id number]   MEDICAL RECORD NO.:  [id number]          PATIENT TYPE:  OUT   LOCATION:  SLEEP CENTER                 FACILITY:  [facility]   PHYSICIAN:  Clinton D. Maple Hudson, M.D. DATE OF BIRTH:  1945/09/10   DATE OF STUDY:                              NOCTURNAL POLYSOMNOGRAM   INDICATION FOR STUDY:  Hypersomnia with sleep apnea.  Epsworth sleepiness  score 15/24.  BMI 27.  Weight 195 pounds.   SLEEP ARCHITECTURE:  Total sleep time 383 minutes with sleep deficiency 92%.  Stage 1 was 6%, stage 2 was 81% and stages 3 and 4 were absent.  REM was 13%  of total sleep.  Sleep 10.5 minutes.  REM latency 154 minutes.  Awake after  sleep onset 25 minutes.  Arousal index 32.7.   NPSG protocol RDI 12.8 per hour indicating mild obstruction sleep  apnea/hypopnea syndrome.  This reflected 42 obstructive apneas and 40  hypopneas.  Most events were while sleeping supine.  REM RDI 15.9 per hour.   OXYGEN DATA:  Moderate to loud snoring with oxygen desaturation to a nadir  of 83%.  Mean oxygen saturation through the study was 94% on room air.   CARDIAC DATA:  Normal cardiac rhythm.   MOVEMENT/PARASOMNIA:  Occasional limp jerks with few causing any sleep  disturbance.   IMPRESSION/RECOMMENDATION:  Mild obstructive sleep apnea/hypopnea syndrome,  RDI 12.8 per hour with oxygen desaturation to 83%.                                                           Clinton D. Maple Hudson, M.D.  Diplomate, [employer] Board   CDY/MEDQ  D:  01/29/2004 15:58:10  T:  01/29/2004 16:20:53  Job:  [id number]

## 2010-08-21 ENCOUNTER — Other Ambulatory Visit: Payer: Self-pay | Admitting: Internal Medicine

## 2010-10-05 ENCOUNTER — Other Ambulatory Visit: Payer: Self-pay | Admitting: *Deleted

## 2010-10-05 MED ORDER — TESTOSTERONE 20.25 MG/ACT (1.62%) TD GEL: 3.0000 | TRANSDERMAL | Status: DC

## 2010-11-30 ENCOUNTER — Telehealth: Payer: Self-pay | Admitting: Internal Medicine

## 2010-11-30 NOTE — Telephone Encounter (Signed)
Returning a response to Dr Kirtland Bouchard regarding who prescribed Celebrex. Dr Pati Gallo prescribed Celebrex. Naproxen is a duplicate therapy and cannot be used  At the same time. Please return [facility]'s call thanks.

## 2010-11-30 NOTE — Telephone Encounter (Signed)
We okay's 08/21/10 #180 2RF Naproxen  Please advise Dr. Farris Has rx'd celebrex

## 2010-12-17 LAB — BASIC METABOLIC PANEL
CO2: 28
Calcium: 8.7
Chloride: 108
Glucose, Bld: 72
Sodium: 142

## 2010-12-17 LAB — CBC
Hemoglobin: 14.3
MCHC: 34.3
MCV: 85.5
RDW: 13

## 2011-04-16 ENCOUNTER — Other Ambulatory Visit: Payer: Self-pay

## 2011-04-16 MED ORDER — TESTOSTERONE 20.25 MG/ACT (1.62%) TD GEL: 3.0000 | Freq: Every day | TRANSDERMAL | Status: DC

## 2011-04-18 ENCOUNTER — Telehealth: Payer: Self-pay | Admitting: Internal Medicine

## 2011-04-18 NOTE — Telephone Encounter (Signed)
5 mg  #90 

## 2011-04-18 NOTE — Telephone Encounter (Signed)
Patient is asking for a 90 day supply of cialis. Please assist and inform patient.

## 2011-04-18 NOTE — Telephone Encounter (Signed)
Last seen 03/2010 for cpx- please advise

## 2011-04-19 NOTE — Telephone Encounter (Signed)
Attempt to call- VM - LMTCB , need to know what pharmacy to sent 90day rx to.

## 2011-05-03 ENCOUNTER — Other Ambulatory Visit (INDEPENDENT_AMBULATORY_CARE_PROVIDER_SITE_OTHER): Payer: BC Managed Care – PPO

## 2011-05-03 DIAGNOSIS — Z0000 Encounter for general adult medical examination without abnormal findings: Secondary | ICD-10-CM

## 2011-05-03 LAB — CBC WITH DIFFERENTIAL/PLATELET
Basophils Absolute: 0 10*3/uL (ref 0.0–0.1)
Eosinophils Relative: 2.5 % (ref 0.0–5.0)
HCT: 43.4 % (ref 39.0–52.0)
Lymphocytes Relative: 28.2 % (ref 12.0–46.0)
Monocytes Relative: 9.8 % (ref 3.0–12.0)
Neutrophils Relative %: 58.7 % (ref 43.0–77.0)
Platelets: 135 10*3/uL — ABNORMAL LOW (ref 150.0–400.0)
RDW: 13.5 % (ref 11.5–14.6)
WBC: 3.7 10*3/uL — ABNORMAL LOW (ref 4.5–10.5)

## 2011-05-03 LAB — POCT URINALYSIS DIPSTICK
Ketones, UA: NEGATIVE
Leukocytes, UA: NEGATIVE
Protein, UA: NEGATIVE
Spec Grav, UA: 1.01
Urobilinogen, UA: 0.2
pH, UA: 7

## 2011-05-03 LAB — TSH: TSH: 1.73 u[IU]/mL (ref 0.35–5.50)

## 2011-05-03 LAB — BASIC METABOLIC PANEL
BUN: 16 mg/dL (ref 6–23)
Calcium: 8.9 mg/dL (ref 8.4–10.5)
Creatinine, Ser: 1.2 mg/dL (ref 0.4–1.5)
GFR: 62.54 mL/min (ref >60.00)
Glucose, Bld: 79 mg/dL (ref 70–99)
Potassium: 4.3 mEq/L (ref 3.5–5.1)

## 2011-05-03 LAB — PSA: PSA: 1.2 ng/mL (ref 0.10–4.00)

## 2011-05-03 LAB — LIPID PANEL
HDL: 34 mg/dL — ABNORMAL LOW (ref >39.00)
LDL Cholesterol: 94 mg/dL (ref 0–99)
VLDL: 13 mg/dL (ref 0.0–40.0)

## 2011-05-03 LAB — HEPATIC FUNCTION PANEL
ALT: 19 U/L (ref 0–53)
AST: 20 U/L (ref 0–37)
Bilirubin, Direct: 0.2 mg/dL (ref 0.0–0.3)
Total Bilirubin: 1.1 mg/dL (ref 0.3–1.2)

## 2011-05-06 ENCOUNTER — Other Ambulatory Visit: Payer: Self-pay | Admitting: Internal Medicine

## 2011-05-09 ENCOUNTER — Encounter: Payer: Self-pay | Admitting: Internal Medicine

## 2011-05-10 ENCOUNTER — Encounter: Payer: Self-pay | Admitting: Internal Medicine

## 2011-05-10 ENCOUNTER — Ambulatory Visit (INDEPENDENT_AMBULATORY_CARE_PROVIDER_SITE_OTHER): Payer: BC Managed Care – PPO | Admitting: Internal Medicine

## 2011-05-10 VITALS — BP 130/84 | HR 82 | Temp 98.6°F | Resp 18 | Ht 68.0 in | Wt 182.0 lb

## 2011-05-10 DIAGNOSIS — Z0000 Encounter for general adult medical examination without abnormal findings: Secondary | ICD-10-CM

## 2011-05-10 MED ORDER — AMPHETAMINE-DEXTROAMPHET ER 30 MG PO CP24: 30.0000 mg | ORAL_CAPSULE | ORAL | Status: DC

## 2011-05-10 MED ORDER — TADALAFIL 20 MG PO TABS: 20.0000 mg | ORAL_TABLET | Freq: Every day | ORAL | Status: DC | PRN

## 2011-05-10 MED ORDER — ZOLPIDEM TARTRATE 10 MG PO TABS: 10.0000 mg | ORAL_TABLET | Freq: Every evening | ORAL | Status: DC | PRN

## 2011-05-10 MED ORDER — TESTOSTERONE 20.25 MG/1.25GM (1.62%) TD GEL: 4.0000 "application " | Freq: Once | TRANSDERMAL | Status: DC

## 2011-05-10 NOTE — Patient Instructions (Signed)
It is important that you exercise regularly, at least 20 minutes 3 to 4 times per week.  If you develop chest pain or shortness of breath seek  medical attention.  Return in 6 months for follow-up  

## 2011-05-10 NOTE — Progress Notes (Signed)
  Subjective:    Patient ID: Harold Wilson, male    DOB: Oct 20, 1945, 66 y.o.   MRN: [id number]  HPI  66 year old patient who is seen today for a wellness exam and DOT physical. He is doing quite well complaints include some shoulder discomfort and insomnia. He is also seeing Dr. Lafayette Dragon in the past and is requesting a refill on Adderall which has been helpful. He has osteoarthritis history of colonic polyps and a history of BPH he has mild dyslipidemia. He is otherwise doing well    Review of Systems  Constitutional: Negative for fever, chills, activity change, appetite change and fatigue.  HENT: Negative for hearing loss, ear pain, congestion, rhinorrhea, sneezing, mouth sores, trouble swallowing, neck pain, neck stiffness, dental problem, voice change, sinus pressure and tinnitus.   Eyes: Negative for photophobia, pain, redness and visual disturbance.  Respiratory: Negative for apnea, cough, choking, chest tightness, shortness of breath and wheezing.   Cardiovascular: Negative for chest pain, palpitations and leg swelling.  Gastrointestinal: Negative for nausea, vomiting, abdominal pain, diarrhea, constipation, blood in stool, abdominal distention, anal bleeding and rectal pain.  Genitourinary: Negative for dysuria, urgency, frequency, hematuria, flank pain, decreased urine volume, discharge, penile swelling, scrotal swelling, difficulty urinating, genital sores and testicular pain.  Musculoskeletal: Positive for arthralgias (bilateral shoulder pain). Negative for myalgias, back pain, joint swelling and gait problem.  Skin: Negative for color change, rash and wound.  Neurological: Negative for dizziness, tremors, seizures, syncope, facial asymmetry, speech difficulty, weakness, light-headedness, numbness and headaches.  Hematological: Negative for adenopathy. Does not bruise/bleed easily.  Psychiatric/Behavioral: Negative for suicidal ideas, hallucinations, behavioral problems, confusion, sleep  disturbance, self-injury, dysphoric mood, decreased concentration and agitation. The patient is not nervous/anxious.        Objective:   Physical Exam  Constitutional: He appears well-developed and well-nourished.  HENT:  Head: Normocephalic and atraumatic.  Right Ear: External ear normal.  Left Ear: External ear normal.  Nose: Nose normal.  Mouth/Throat: Oropharynx is clear and moist.  Eyes: Conjunctivae and EOM are normal. Pupils are equal, round, and reactive to light. No scleral icterus.       Vision 20/20 with glasses left right and each eye  Neck: Normal range of motion. Neck supple. No JVD present. No thyromegaly present.  Cardiovascular: Regular rhythm, normal heart sounds and intact distal pulses.  Exam reveals no gallop and no friction rub.   No murmur heard. Pulmonary/Chest: Effort normal and breath sounds normal. He exhibits no tenderness.  Abdominal: Soft. Bowel sounds are normal. He exhibits no distension and no mass. There is no tenderness.  Genitourinary: Prostate normal and penis normal. Guaiac negative stool.       +2 enlarged symmetrical  Musculoskeletal: Normal range of motion. He exhibits no edema and no tenderness.  Lymphadenopathy:    He has no cervical adenopathy.  Neurological: He is alert. He has normal reflexes. No cranial nerve deficit. Coordination normal.  Skin: Skin is warm and dry. No rash noted.  Psychiatric: He has a normal mood and affect. His behavior is normal.          Assessment & Plan:  Preventive health examination Bilateral shoulder pain. Followup orthopedics ADHD prescription for Adderall dispensed. Followup psychiatry BPH stable Osteoarthritis  Recheck one year or as needed DOT form completed

## 2011-06-05 ENCOUNTER — Other Ambulatory Visit: Payer: Self-pay

## 2011-06-05 MED ORDER — FLUOXETINE HCL 20 MG PO CAPS: 20.0000 mg | ORAL_CAPSULE | Freq: Every day | ORAL | Status: DC

## 2011-06-05 MED ORDER — TAMSULOSIN HCL 0.4 MG PO CAPS: 0.4000 mg | ORAL_CAPSULE | Freq: Every day | ORAL | Status: DC

## 2011-06-05 MED ORDER — NIACIN ER (ANTIHYPERLIPIDEMIC) 1000 MG PO TBCR: 1000.0000 mg | EXTENDED_RELEASE_TABLET | Freq: Every day | ORAL | Status: DC

## 2011-06-05 MED ORDER — TADALAFIL 20 MG PO TABS: 20.0000 mg | ORAL_TABLET | Freq: Every day | ORAL | Status: DC | PRN

## 2011-06-05 MED ORDER — NAPROXEN 500 MG PO TBEC: 500.0000 mg | DELAYED_RELEASE_TABLET | Freq: Two times a day (BID) | ORAL | Status: DC

## 2011-06-05 NOTE — Telephone Encounter (Signed)
Attempt to call - VM - LMTCB if questions - rx ready for pick in the AM -

## 2011-07-03 ENCOUNTER — Telehealth: Payer: Self-pay | Admitting: Internal Medicine

## 2011-07-03 NOTE — Telephone Encounter (Signed)
Pt called and has a sinus inf and is req an abx or med to help clear it up. Pt works 7days a wk and can not come in of ov. Pls call in to [facility] on [address].

## 2011-07-03 NOTE — Telephone Encounter (Signed)
Please advise 

## 2011-07-04 NOTE — Telephone Encounter (Signed)
Spoke with pt - informed of dr. Vernon Prey instructions - to see if no better in 5-7 days

## 2011-07-04 NOTE — Telephone Encounter (Signed)
Acute sinusitis symptoms for less than 10 days are generally not helped by antibiotic therapy.  Use saline irrigation, warm  moist compresses and over-the-counter decongestants only as directed.  Call if there is no improvement in 5 to 7 days, or sooner if you develop increasing pain, fever, or any new symptoms. 

## 2011-07-12 ENCOUNTER — Telehealth: Payer: Self-pay | Admitting: *Deleted

## 2011-07-12 NOTE — Telephone Encounter (Signed)
Pt declined ov states he is unable to leave work.  Offered to make an appt for Saturday clinic tomorrow or appt with Dr. Kirtland Bouchard on Monday, pt declined also.

## 2011-07-12 NOTE — Telephone Encounter (Signed)
Please inform dr. Amador Cunas is out until Monday - offer appt to be seen by another provider. I am also out for the day.

## 2011-07-12 NOTE — Telephone Encounter (Signed)
Left message at all numbers to have pt to call us back for appt here at the office.

## 2011-07-12 NOTE — Telephone Encounter (Signed)
Pt stats his sinus congestion and cough has moved to a cough, and nothing he is taking is helping.  No fever.  Still has painful joints in his left hand , and is asking for a RX to take for the pain.

## 2011-07-12 NOTE — Telephone Encounter (Signed)
Be happy to see.

## 2011-07-25 ENCOUNTER — Telehealth: Payer: Self-pay

## 2011-07-25 NOTE — Telephone Encounter (Signed)
Pt called and states he was in back in March 2013 for DOT cpx and needs the paperwork and card from that visit.  Pt would like to pick up.  Pt states he was treated for a sinus infection about a month ago and it has cleared up but pt still has a nagging cough and would like to know if an rx can be called in for coughing to [facility] on [address]. Pls advise.

## 2011-07-26 MED ORDER — HYDROCODONE-HOMATROPINE 5-1.5 MG/5ML PO SYRP: 5.0000 mL | ORAL_SOLUTION | Freq: Four times a day (QID) | ORAL | Status: AC | PRN

## 2011-07-26 NOTE — Telephone Encounter (Signed)
Spoke with pt - just needs small card done - lost it - I told him it would be ready for pick up next week.  [facility]. KIK

## 2011-07-26 NOTE — Telephone Encounter (Signed)
He was in here for DOT cpx- do you remember filling out form?  Wants rx for sinus - please advise

## 2011-07-26 NOTE — Telephone Encounter (Signed)
Hydromet  Generic 1 teaspoon every 6 hours as needed for cough and congestion DOT forms are always completed at the time of the physical and given back to the patient

## 2011-08-15 ENCOUNTER — Other Ambulatory Visit: Payer: Self-pay | Admitting: Internal Medicine

## 2011-08-15 NOTE — Telephone Encounter (Signed)
Patient called stating that he would like a 90 day refill of his adderall. Patient also need a refill of his ambien. Please assist.

## 2011-08-16 ENCOUNTER — Other Ambulatory Visit: Payer: Self-pay

## 2011-08-16 MED ORDER — AMPHETAMINE-DEXTROAMPHETAMINE 30 MG PO TABS: 30.0000 mg | ORAL_TABLET | Freq: Every day | ORAL | Status: DC

## 2011-08-16 MED ORDER — AMPHETAMINE-DEXTROAMPHET ER 30 MG PO CP24: 30.0000 mg | ORAL_CAPSULE | ORAL | Status: DC

## 2011-08-16 NOTE — Telephone Encounter (Signed)
adderral refills ready - too early for zolpidem

## 2011-08-16 NOTE — Telephone Encounter (Signed)
Filled on different encounter - VM to pt - rx'x ready for pick up - zolpidem too early - not due until July. KIK

## 2011-09-03 ENCOUNTER — Telehealth: Payer: Self-pay | Admitting: Internal Medicine

## 2011-09-03 MED ORDER — AMPHETAMINE-DEXTROAMPHET ER 30 MG PO CP24: 30.0000 mg | ORAL_CAPSULE | ORAL | Status: DC

## 2011-09-03 MED ORDER — ZOLPIDEM TARTRATE 10 MG PO TABS: 10.0000 mg | ORAL_TABLET | Freq: Every evening | ORAL | Status: DC | PRN

## 2011-09-03 NOTE — Telephone Encounter (Signed)
Ok  Harold Wilson is not for nightly use- does not need 90/90 days;  Ok RF  #60

## 2011-09-03 NOTE — Telephone Encounter (Signed)
rx's ready for pick up - must return adderall rx's for swap

## 2011-09-03 NOTE — Telephone Encounter (Signed)
Pt called and said that the Adderall script was incorrect. Pt was given script for Adderall 30 mg, but it should have have been XL 30 mg. Pt turned 1 script into pharmacy and got it filled. Pt said that he will bring the other 2 script in to be shredded when he comes in to pick up new script. Pls call.

## 2011-09-03 NOTE — Telephone Encounter (Signed)
2 rx 's done - pt aware to return wrong - plain adderall before we can give XR - he will send his daughter tomoorw to pick up   He was also asking for a 90 day rx to [facility] for zolpidem - last written 05/10/11 # 30 3 RF - but ins will only cover 90 dayrx - please advise

## 2011-09-07 ENCOUNTER — Emergency Department (HOSPITAL_COMMUNITY)
Admission: EM | Admit: 2011-09-07 | Discharge: 2011-09-07 | Disposition: A | Discharge status: Home / Self Care | Payer: BC Managed Care – PPO | Attending: Emergency Medicine | Admitting: Emergency Medicine

## 2011-09-07 ENCOUNTER — Encounter (HOSPITAL_COMMUNITY): Payer: Self-pay | Admitting: *Deleted

## 2011-09-07 DIAGNOSIS — W268XXA Contact with other sharp object(s), not elsewhere classified, initial encounter: Secondary | ICD-10-CM | POA: Insufficient documentation

## 2011-09-07 DIAGNOSIS — S81009A Unspecified open wound, unspecified knee, initial encounter: Secondary | ICD-10-CM | POA: Insufficient documentation

## 2011-09-07 DIAGNOSIS — S91009A Unspecified open wound, unspecified ankle, initial encounter: Secondary | ICD-10-CM | POA: Insufficient documentation

## 2011-09-07 DIAGNOSIS — S81819A Laceration without foreign body, unspecified lower leg, initial encounter: Secondary | ICD-10-CM

## 2011-09-07 DIAGNOSIS — Z23 Encounter for immunization: Secondary | ICD-10-CM | POA: Insufficient documentation

## 2011-09-07 MED ORDER — TETANUS-DIPHTH-ACELL PERTUSSIS 5-2.5-18.5 LF-MCG/0.5 IM SUSP
0.5000 mL | Freq: Once | INTRAMUSCULAR | Status: AC
  Administered 2011-09-07: 0.5 mL via INTRAMUSCULAR
  Filled 2011-09-07: qty 0.5

## 2011-09-07 NOTE — ED Notes (Signed)
Pt was retrieving mail bin out of postal truck and it slipped and cut in right leg at tibia area bleeding controlled

## 2011-09-07 NOTE — Discharge Instructions (Signed)
Laceration Care, Adult A laceration is a cut or lesion that goes through all layers of the skin and into the tissue just beneath the skin. TREATMENT  Some lacerations may not require closure. Some lacerations may not be able to be closed due to an increased risk of infection. It is important to see your caregiver as soon as possible after an injury to minimize the risk of infection and maximize the opportunity for successful closure. If closure is appropriate, pain medicines may be given, if needed. The wound will be cleaned to help prevent infection. Your caregiver will use stitches (sutures), staples, wound glue (adhesive), or skin adhesive strips to repair the laceration. These tools bring the skin edges together to allow for faster healing and a better cosmetic outcome. However, all wounds will heal with a scar. Once the wound has healed, scarring can be minimized by covering the wound with sunscreen during the day for 1 full year. HOME CARE INSTRUCTIONS  For sutures or staples:  Keep the wound clean and dry.   If you were given a bandage (dressing), you should change it at least once a day. Also, change the dressing if it becomes wet or dirty, or as directed by your caregiver.   Wash the wound with soap and water 2 times a day. Rinse the wound off with water to remove all soap. Pat the wound dry with a clean towel.   After cleaning, apply a thin layer of the antibiotic ointment as recommended by your caregiver. This will help prevent infection and keep the dressing from sticking.   You may shower as usual after the first 24 hours. Do not soak the wound in water until the sutures are removed.   Only take over-the-counter or prescription medicines for pain, discomfort, or fever as directed by your caregiver.   Get your sutures or staples removed as directed by your caregiver.  For skin adhesive strips:  Keep the wound clean and dry.   Do not get the skin adhesive strips wet. You may bathe  carefully, using caution to keep the wound dry.   If the wound gets wet, pat it dry with a clean towel.   Skin adhesive strips will fall off on their own. You may trim the strips as the wound heals. Do not remove skin adhesive strips that are still stuck to the wound. They will fall off in time.  For wound adhesive:  You may briefly wet your wound in the shower or bath. Do not soak or scrub the wound. Do not swim. Avoid periods of heavy perspiration until the skin adhesive has fallen off on its own. After showering or bathing, gently pat the wound dry with a clean towel.   Do not apply liquid medicine, cream medicine, or ointment medicine to your wound while the skin adhesive is in place. This may loosen the film before your wound is healed.   If a dressing is placed over the wound, be careful not to apply tape directly over the skin adhesive. This may cause the adhesive to be pulled off before the wound is healed.   Avoid prolonged exposure to sunlight or tanning lamps while the skin adhesive is in place. Exposure to ultraviolet light in the first year will darken the scar.   The skin adhesive will usually remain in place for 5 to 10 days, then naturally fall off the skin. Do not pick at the adhesive film.  You may need a tetanus shot if:  You   cannot remember when you had your last tetanus shot.   You have never had a tetanus shot.  If you get a tetanus shot, your arm may swell, get red, and feel warm to the touch. This is common and not a problem. If you need a tetanus shot and you choose not to have one, there is a rare chance of getting tetanus. Sickness from tetanus can be serious. SEEK MEDICAL CARE IF:   You have redness, swelling, or increasing pain in the wound.   You see a red line that goes away from the wound.   You have yellowish-white fluid (pus) coming from the wound.   You have a fever.   You notice a bad smell coming from the wound or dressing.   Your wound breaks  open before or after sutures have been removed.   You notice something coming out of the wound such as wood or glass.   Your wound is on your hand or foot and you cannot move a finger or toe.  SEEK IMMEDIATE MEDICAL CARE IF:   Your pain is not controlled with prescribed medicine.   You have severe swelling around the wound causing pain and numbness or a change in color in your arm, hand, leg, or foot.   Your wound splits open and starts bleeding.   You have worsening numbness, weakness, or loss of function of any joint around or beyond the wound.   You develop painful lumps near the wound or on the skin anywhere on your body.  MAKE SURE YOU:   Understand these instructions.   Will watch your condition.   Will get help right away if you are not doing well or get worse.  Document Released: 02/18/2005 Document Revised: 02/07/2011 Document Reviewed: 08/14/2010 Exit[employer] Patient Information 2012 [employer].Stitches, Staples, or Skin Adhesive Strips  Stitches (sutures), staples, and skin adhesive strips hold the skin together as it heals. They will usually be in place for 7 days or less. HOME CARE  Wash your hands with soap and water before and after you touch your wound.   Only take medicine as told by your doctor.   Cover your wound only if your doctor told you to. Otherwise, leave it open to air.   Do not get your stitches wet or dirty. If they get dirty, dab them gently with a clean washcloth. Wet the washcloth with soapy water. Do not rub. Pat them dry gently.   Do not put medicine or medicated cream on your stitches unless your doctor told you to.   Do not take out your own stitches or staples. Skin adhesive strips will fall off by themselves.   Do not pick at the wound. Picking can cause an infection.   Do not miss your follow-up appointment.   If you have problems or questions, call your doctor.  GET HELP RIGHT AWAY IF:   You have a temperature by mouth above 102  F (38.9 C), not controlled by medicine.   You have chills.   You have redness or pain around your stitches.   There is puffiness (swelling) around your stitches.   You notice fluid (drainage) from your stitches.   There is a bad smell coming from your wound.  MAKE SURE YOU:  Understand these instructions.   Will watch your condition.   Will get help if you are not doing well or get worse.  Document Released: 12/16/2008 Document Revised: 02/07/2011 Document Reviewed: 12/16/2008 Exit[employer] Patient Information 2012 [employer]. 

## 2011-09-07 NOTE — ED Provider Notes (Signed)
History     CSN: [id number]  Arrival date & time 09/07/11  2003   First MD Initiated Contact with Patient 09/07/11 2052      Chief Complaint  Patient presents with  . Extremity Laceration    (Consider location/radiation/quality/duration/timing/severity/associated sxs/prior treatment) HPI Comments: Patient here with right shin laceration - states that he was getting something out of his truck when the metal edge slipped and landed on his right leg - here with hemostatic 2cm laceration - reports tetanus unknown.  Patient is a 66 y.o. male presenting with skin laceration. The history is provided by the patient. No language interpreter was used.  Laceration  The incident occurred less than 1 hour ago. The laceration is located on the right leg. The laceration is 2 cm in size. The laceration mechanism was a a metal edge. The pain is at a severity of 3/10. The pain is mild. The pain has been constant since onset. He reports no foreign bodies present. His tetanus status is out of date.    Past Medical History  Diagnosis Date  . BENIGN PROSTATIC HYPERTROPHY 02/06/2007  . COLONIC POLYPS, HX OF 02/06/2007  . DIVERTICULOSIS, COLON 02/06/2007  . ERECTILE DYSFUNCTION, NON-ORGANIC, MILD 02/23/2009  . HYPERLIPIDEMIA 02/22/2008  . OSTEOARTHRITIS 02/06/2007    Past Surgical History  Procedure Date  . Vasectomy   . Rotator cuff repair   . Tonsillectomy   . Cervical spine surgery   . Temporomandibular joint surgery     Family History  Problem Relation Age of Onset  . COPD Mother   . Heart disease Mother   . Cancer Father     lung ca    History  Substance Use Topics  . Smoking status: Never Smoker   . Smokeless tobacco: Never Used  . Alcohol Use: Yes      Review of Systems  Constitutional: Negative for fever and chills.  HENT: Negative for neck pain.   Eyes: Negative for pain.  Respiratory: Negative for chest tightness and shortness of breath.   Cardiovascular: Negative for  chest pain.  Gastrointestinal: Negative for abdominal pain.  Genitourinary: Negative for flank pain.  Musculoskeletal: Negative for back pain.  Skin: Positive for wound.  Neurological: Negative for light-headedness and headaches.  All other systems reviewed and are negative.    Allergies  Sulfacetamide sodium  Home Medications   Current Outpatient Rx  Name Route Sig Dispense Refill  . AMPHETAMINE-DEXTROAMPHET ER 30 MG PO CP24 Oral Take 1 capsule (30 mg total) by mouth every morning. 30 capsule 0  . FLUOXETINE HCL 20 MG PO CAPS Oral Take 1 capsule (20 mg total) by mouth daily. 90 capsule 3  . NAPROXEN 500 MG PO TBEC Oral Take 1 tablet (500 mg total) by mouth 2 (two) times daily with a meal. 180 tablet 3  . NIACIN ER (ANTIHYPERLIPIDEMIC) 1000 MG PO TBCR Oral Take 1 tablet (1,000 mg total) by mouth at bedtime. 90 tablet 3  . TAMSULOSIN HCL 0.4 MG PO CAPS Oral Take 1 capsule (0.4 mg total) by mouth daily. 90 capsule 3  . TESTOSTERONE 20.25 MG/1.25GM (1.62%) TD GEL Transdermal Place 4 application onto the skin once. 1.25 g 4  . ZOLPIDEM TARTRATE 10 MG PO TABS Oral Take 1 tablet (10 mg total) by mouth at bedtime as needed for sleep. 60 tablet 0  . TADALAFIL 20 MG PO TABS Oral Take 1 tablet (20 mg total) by mouth daily as needed for erectile dysfunction. 10 tablet 3  .  ZOLPIDEM TARTRATE 10 MG PO TABS Oral Take 1 tablet (10 mg total) by mouth at bedtime as needed for sleep. 30 tablet 3    BP 149/93  Pulse 80  Temp 98.8 F (37.1 C) (Oral)  Resp 20  SpO2 99%  Physical Exam  Nursing note and vitals reviewed. Constitutional: He is oriented to person, place, and time. He appears well-developed and well-nourished. No distress.  HENT:  Head: Normocephalic and atraumatic.  Right Ear: External ear normal.  Left Ear: External ear normal.  Nose: Nose normal.  Mouth/Throat: Oropharynx is clear and moist. No oropharyngeal exudate.  Eyes: Conjunctivae are normal. Pupils are equal, round, and  reactive to light. No scleral icterus.  Neck: Normal range of motion. Neck supple.  Cardiovascular: Normal rate, regular rhythm and normal heart sounds.  Exam reveals no gallop and no friction rub.   No murmur heard. Pulmonary/Chest: Effort normal and breath sounds normal. No respiratory distress. He has no wheezes. He has no rales. He exhibits no tenderness.  Abdominal: Soft. Bowel sounds are normal. He exhibits no distension. There is no tenderness.  Musculoskeletal: Normal range of motion. He exhibits tenderness. He exhibits no edema.  Lymphadenopathy:    He has no cervical adenopathy.  Neurological: He is alert and oriented to person, place, and time. No cranial nerve deficit. He exhibits normal muscle tone. Coordination normal.  Skin: Skin is warm and dry. No rash noted. No erythema. No pallor.       2 cm laceration to anterior right shin, hemostatic.  Psychiatric: He has a normal mood and affect. His behavior is normal. Judgment and thought content normal.    ED Course  Procedures (including critical care time)  Labs Reviewed - No data to display No results found.  LACERATION REPAIR Performed by: Patrecia Pour. Authorized by: Patrecia Pour Consent: Verbal consent obtained. Risks and benefits: risks, benefits and alternatives were discussed Consent given by: patient Patient identity confirmed: provided demographic data Prepped and Draped in normal sterile fashion Wound explored  Laceration Location: right shin  Laceration Length: 2cm  No Foreign Bodies seen or palpated  Anesthesia: local infiltration  Local anesthetic: lidocaine 2% with epinephrine  Anesthetic total: 3 ml  Irrigation method: syringe Amount of cleaning: standard  Skin closure: nylon 5.0  Number of sutures: 4  Technique: simple interrupted.  Patient tolerance: Patient tolerated the procedure well with no immediate complications.  Right shin laceration   MDM  Patient here with  right shin laceration without complications - tetanus up dated.       Izola Price [place], [place] 09/07/11 2153

## 2011-09-08 NOTE — ED Provider Notes (Signed)
Medical screening examination/treatment/procedure(s) were performed by non-physician practitioner and as supervising physician I was immediately available for consultation/collaboration.  Dailynn Nancarrow M Karlisha Mathena, MD 09/08/11 1240 

## 2011-09-20 ENCOUNTER — Other Ambulatory Visit: Payer: Self-pay

## 2011-09-20 MED ORDER — ZOLPIDEM TARTRATE 10 MG PO TABS: 10.0000 mg | ORAL_TABLET | Freq: Every evening | ORAL | Status: DC | PRN

## 2011-11-01 ENCOUNTER — Other Ambulatory Visit: Payer: Self-pay

## 2011-11-01 MED ORDER — ZOLPIDEM TARTRATE 10 MG PO TABS: 10.0000 mg | ORAL_TABLET | Freq: Every evening | ORAL | Status: DC | PRN

## 2011-11-11 ENCOUNTER — Ambulatory Visit (INDEPENDENT_AMBULATORY_CARE_PROVIDER_SITE_OTHER): Payer: BC Managed Care – PPO | Admitting: Internal Medicine

## 2011-11-11 ENCOUNTER — Encounter: Payer: Self-pay | Admitting: Internal Medicine

## 2011-11-11 VITALS — BP 130/72 | Temp 98.3°F | Wt 179.0 lb

## 2011-11-11 DIAGNOSIS — M1990 Unspecified osteoarthritis, unspecified site: Secondary | ICD-10-CM

## 2011-11-11 DIAGNOSIS — E785 Hyperlipidemia, unspecified: Secondary | ICD-10-CM

## 2011-11-11 DIAGNOSIS — F909 Attention-deficit hyperactivity disorder, unspecified type: Secondary | ICD-10-CM | POA: Insufficient documentation

## 2011-11-11 MED ORDER — AMPHETAMINE-DEXTROAMPHET ER 30 MG PO CP24: 30.0000 mg | ORAL_CAPSULE | ORAL | Status: DC

## 2011-11-11 MED ORDER — TADALAFIL 5 MG PO TABS: 5.0000 mg | ORAL_TABLET | Freq: Every day | ORAL | Status: DC | PRN

## 2011-11-11 NOTE — Patient Instructions (Signed)
Limit your sodium (Salt) intake    It is important that you exercise regularly, at least 20 minutes 3 to 4 times per week.  If you develop chest pain or shortness of breath seek  medical attention.  Return in 6 months for follow-up  

## 2011-11-11 NOTE — Progress Notes (Signed)
Subjective:    Patient ID: Harold Wilson, male    DOB: 06/11/1945, 66 y.o.   MRN: [id number]  HPI  66 year old patient who is seen today for followup. He has a history of mild dyslipidemia controlled on niacin therapy. He has DJD mild DJD. Only complaint is the arthritic especially involving the left fourth finger. He has ADHD and has been followed by Dr.Kaur in the past. He has done well on his present regimen. No new concerns or complaints.  Past Medical History  Diagnosis Date  . BENIGN PROSTATIC HYPERTROPHY 02/06/2007  . COLONIC POLYPS, HX OF 02/06/2007  . DIVERTICULOSIS, COLON 02/06/2007  . ERECTILE DYSFUNCTION, NON-ORGANIC, MILD 02/23/2009  . HYPERLIPIDEMIA 02/22/2008  . OSTEOARTHRITIS 02/06/2007    History   Social History  . Marital Status: Married    Spouse Name: N/A    Number of Children: N/A  . Years of Education: N/A   Occupational History  . Not on file.   Social History Main Topics  . Smoking status: Never Smoker   . Smokeless tobacco: Never Used  . Alcohol Use: Yes  . Drug Use: No  . Sexually Active: Not on file   Other Topics Concern  . Not on file   Social History Narrative  . No narrative on file    Past Surgical History  Procedure Date  . Vasectomy   . Rotator cuff repair   . Tonsillectomy   . Cervical spine surgery   . Temporomandibular joint surgery     Family History  Problem Relation Age of Onset  . COPD Mother   . Heart disease Mother   . Cancer Father     lung ca    Allergies  Allergen Reactions  . Sulfacetamide Sodium     Dryness of mouth     Current Outpatient Prescriptions on File Prior to Visit  Medication Sig Dispense Refill  . FLUoxetine (PROZAC) 20 MG capsule Take 1 capsule (20 mg total) by mouth daily.  90 capsule  3  . naproxen (NAPROXEN DR) 500 MG EC tablet Take 1 tablet (500 mg total) by mouth 2 (two) times daily with a meal.  180 tablet  3  . niacin (NIASPAN) 1000 MG CR tablet Take 1 tablet (1,000 mg total) by  mouth at bedtime.  90 tablet  3  . Tamsulosin HCl (FLOMAX) 0.4 MG CAPS Take 1 capsule (0.4 mg total) by mouth daily.  90 capsule  3  . Testosterone (ANDROGEL) 20.25 MG/1.25GM (1.62%) GEL Place 4 application onto the skin once.  1.25 g  4  . zolpidem (AMBIEN) 10 MG tablet Take 1 tablet (10 mg total) by mouth at bedtime as needed for sleep.  30 tablet  0  . DISCONTD: tadalafil (CIALIS) 20 MG tablet Take 1 tablet (20 mg total) by mouth daily as needed for erectile dysfunction.  10 tablet  3    BP 130/72  Temp 98.3 F (36.8 C) (Oral)  Wt 179 lb (81.194 kg)        Review of Systems  Constitutional: Negative for fever, chills, appetite change and fatigue.  HENT: Negative for hearing loss, ear pain, congestion, sore throat, trouble swallowing, neck stiffness, dental problem, voice change and tinnitus.   Eyes: Negative for pain, discharge and visual disturbance.  Respiratory: Negative for cough, chest tightness, wheezing and stridor.   Cardiovascular: Negative for chest pain, palpitations and leg swelling.  Gastrointestinal: Negative for nausea, vomiting, abdominal pain, diarrhea, constipation, blood in stool and abdominal distention.  Genitourinary: Positive for frequency. Negative for urgency, hematuria, flank pain, discharge, difficulty urinating and genital sores.  Musculoskeletal: Positive for arthralgias. Negative for myalgias, back pain, joint swelling and gait problem.  Skin: Negative for rash.  Neurological: Negative for dizziness, syncope, speech difficulty, weakness, numbness and headaches.  Hematological: Negative for adenopathy. Does not bruise/bleed easily.  Psychiatric/Behavioral: Negative for behavioral problems and dysphoric mood. The patient is not nervous/anxious.        Objective:   Physical Exam  Constitutional: He is oriented to person, place, and time. He appears well-developed.  HENT:  Head: Normocephalic.  Right Ear: External ear normal.  Left Ear: External  ear normal.  Eyes: Conjunctivae and EOM are normal.  Neck: Normal range of motion.  Cardiovascular: Normal rate and normal heart sounds.   Pulmonary/Chest: Breath sounds normal.  Abdominal: Bowel sounds are normal.  Musculoskeletal: Normal range of motion. He exhibits no edema and no tenderness.  Neurological: He is alert and oriented to person, place, and time.  Psychiatric: He has a normal mood and affect. His behavior is normal.          Assessment & Plan:   ADHD. Stable medications refilled Mild dyslipidemia. We'll continue niacin. Will check a lipid panel in 6 months Erectile dysfunction Mild BPH  CPX 6 months

## 2012-01-09 ENCOUNTER — Telehealth: Payer: Self-pay

## 2012-01-09 MED ORDER — ZOLPIDEM TARTRATE 10 MG PO TABS: 10.0000 mg | ORAL_TABLET | Freq: Every evening | ORAL | Status: DC | PRN

## 2012-01-09 NOTE — Telephone Encounter (Signed)
Called in.

## 2012-02-19 ENCOUNTER — Telehealth: Payer: Self-pay | Admitting: Internal Medicine

## 2012-02-19 NOTE — Telephone Encounter (Signed)
Patient called stating that he need a refill of his adderall xr 30mg  24 hr caps 1poqd. Patient needs 3 rxs for this. Please assist.

## 2012-02-19 NOTE — Telephone Encounter (Signed)
Patient also need a refill of his ambien 10 mg 1po at bed time prn for sleep. Please assist and also the previous message.

## 2012-02-20 MED ORDER — AMPHETAMINE-DEXTROAMPHET ER 30 MG PO CP24: 30.0000 mg | ORAL_CAPSULE | ORAL | Status: DC

## 2012-02-20 MED ORDER — ZOLPIDEM TARTRATE 10 MG PO TABS: 10.0000 mg | ORAL_TABLET | Freq: Every evening | ORAL | Status: DC | PRN

## 2012-02-20 NOTE — Telephone Encounter (Signed)
Left message on voicemail. Rx's are ready for pick up.

## 2012-02-21 ENCOUNTER — Telehealth: Payer: Self-pay | Admitting: Internal Medicine

## 2012-02-21 MED ORDER — TADALAFIL 20 MG PO TABS: 20.0000 mg | ORAL_TABLET | Freq: Every day | ORAL | Status: DC | PRN

## 2012-02-21 NOTE — Telephone Encounter (Signed)
#  12  RF [phone]

## 2012-02-21 NOTE — Telephone Encounter (Signed)
Spoke to pt asked what dosage he is taking? Pt stated is taking 20 mg and needs #24 for 90 day supply. Told pt okay will send to [facility].

## 2012-02-21 NOTE — Telephone Encounter (Signed)
Pt notified Rx's are ready for pick up at front desk. Pt verbalized understanding.

## 2012-02-21 NOTE — Telephone Encounter (Signed)
Patient wants an RX for Cialis (3 month supply).   Please send to [facility]

## 2012-04-20 ENCOUNTER — Other Ambulatory Visit: Payer: Self-pay | Admitting: *Deleted

## 2012-04-20 MED ORDER — ZOLPIDEM TARTRATE 10 MG PO TABS: 10.0000 mg | ORAL_TABLET | Freq: Every evening | ORAL | Status: DC | PRN

## 2012-05-05 ENCOUNTER — Other Ambulatory Visit: Payer: BC Managed Care – PPO

## 2012-05-06 ENCOUNTER — Other Ambulatory Visit (INDEPENDENT_AMBULATORY_CARE_PROVIDER_SITE_OTHER): Payer: Managed Care, Other (non HMO)

## 2012-05-06 LAB — CBC WITH DIFFERENTIAL/PLATELET
Basophils Absolute: 0 10*3/uL (ref 0.0–0.1)
Eosinophils Relative: 1.9 % (ref 0.0–5.0)
Lymphocytes Relative: 28.5 % (ref 12.0–46.0)
Lymphs Abs: 1.1 10*3/uL (ref 0.7–4.0)
Monocytes Relative: 8.4 % (ref 3.0–12.0)
Neutrophils Relative %: 60.9 % (ref 43.0–77.0)
Platelets: 160 10*3/uL (ref 150.0–400.0)
RDW: 12.7 % (ref 11.5–14.6)
WBC: 3.9 10*3/uL — ABNORMAL LOW (ref 4.5–10.5)

## 2012-05-06 LAB — HEPATIC FUNCTION PANEL
ALT: 12 U/L (ref 0–53)
AST: 13 U/L (ref 0–37)
Bilirubin, Direct: 0.2 mg/dL (ref 0.0–0.3)
Total Bilirubin: 1 mg/dL (ref 0.3–1.2)

## 2012-05-06 LAB — LIPID PANEL
Cholesterol: 129 mg/dL (ref 0–200)
HDL: 32 mg/dL — ABNORMAL LOW (ref >39.00)
LDL Cholesterol: 87 mg/dL (ref 0–99)
Triglycerides: 52 mg/dL (ref 0.0–149.0)

## 2012-05-06 LAB — POCT URINALYSIS DIPSTICK
Bilirubin, UA: NEGATIVE
Blood, UA: NEGATIVE
Glucose, UA: NEGATIVE
Ketones, UA: NEGATIVE
Leukocytes, UA: NEGATIVE
Nitrite, UA: NEGATIVE
pH, UA: 6.5

## 2012-05-06 LAB — BASIC METABOLIC PANEL
BUN: 11 mg/dL (ref 6–23)
Calcium: 8.5 mg/dL (ref 8.4–10.5)
Creatinine, Ser: 1.1 mg/dL (ref 0.4–1.5)
GFR: 70.84 mL/min (ref >60.00)
Glucose, Bld: 76 mg/dL (ref 70–99)
Potassium: 3.9 mEq/L (ref 3.5–5.1)

## 2012-05-06 LAB — TSH: TSH: 1.92 u[IU]/mL (ref 0.35–5.50)

## 2012-05-12 ENCOUNTER — Encounter: Payer: Self-pay | Admitting: Internal Medicine

## 2012-05-12 ENCOUNTER — Ambulatory Visit (INDEPENDENT_AMBULATORY_CARE_PROVIDER_SITE_OTHER): Payer: Managed Care, Other (non HMO) | Admitting: Internal Medicine

## 2012-05-12 VITALS — BP 146/90 | HR 68 | Temp 97.5°F | Resp 18 | Ht 68.5 in | Wt 183.0 lb

## 2012-05-12 MED ORDER — ZOLPIDEM TARTRATE 10 MG PO TABS: 10.0000 mg | ORAL_TABLET | Freq: Every evening | ORAL | Status: DC | PRN

## 2012-05-12 MED ORDER — AMPHETAMINE-DEXTROAMPHET ER 30 MG PO CP24: 30.0000 mg | ORAL_CAPSULE | ORAL | Status: DC

## 2012-05-12 MED ORDER — FLUTICASONE PROPIONATE 50 MCG/ACT NA SUSP: 2.0000 | Freq: Every day | NASAL | Status: DC

## 2012-05-12 NOTE — Patient Instructions (Signed)
It is important that you exercise regularly, at least 20 minutes 3 to 4 times per week.  If you develop chest pain or shortness of breath seek  medical attention.  Return in one year for follow-up  Limit your sodium (Salt) intake

## 2012-05-12 NOTE — Progress Notes (Signed)
Subjective:    Patient ID: Harold Cellar., male    DOB: 1945/09/08, 67 y.o.   MRN: [id number]  HPI  Subjective:    Patient ID: Harold Wilson, male    DOB: Oct 06, 1945, 67 y.o.   MRN: [id number]  HPI  67 year old patient who is seen today for a wellness exam and DOT physical. He is doing quite well complaints include some shoulder discomfort and insomnia. He is also seeing Dr. Evelene Croon in an in and and and and and in the past and is requesting a refill on Adderall which has been helpful. He has osteoarthritis history of colonic polyps and a history of BPH he has mild dyslipidemia. He is otherwise doing well    Review of Systems  Constitutional: Negative for fever, chills, activity change, appetite change and fatigue.  HENT: Negative for hearing loss, ear pain, congestion, rhinorrhea, sneezing, mouth sores, trouble swallowing, neck pain, neck stiffness, dental problem, voice change, sinus pressure and tinnitus.   Eyes: Negative for photophobia, pain, redness and visual disturbance.  Respiratory: Negative for apnea, cough, choking, chest tightness, shortness of breath and wheezing.   Cardiovascular: Negative for chest pain, palpitations and leg swelling.  Gastrointestinal: Negative for nausea, vomiting, abdominal pain, diarrhea, constipation, blood in stool, abdominal distention, anal bleeding and rectal pain.  Genitourinary: Negative for dysuria, urgency, frequency, hematuria, flank pain, decreased urine volume, discharge, penile swelling, scrotal swelling, difficulty urinating, genital sores and testicular pain.  Musculoskeletal: Positive for arthralgias (bilateral shoulder pain). Negative for myalgias, back pain, joint swelling and gait problem.  Skin: Negative for color change, rash and wound.  Neurological: Negative for dizziness, tremors, seizures, syncope, facial asymmetry, speech difficulty, weakness, light-headedness, numbness and headaches.  Hematological: Negative for adenopathy.  Does not bruise/bleed easily.  Psychiatric/Behavioral: Negative for suicidal ideas, hallucinations, behavioral problems, confusion, sleep disturbance, self-injury, dysphoric mood, decreased concentration and agitation. The patient is not nervous/anxious.        Objective:   Physical Exam  Constitutional: He appears well-developed and well-nourished.  HENT:  Head: Normocephalic and atraumatic.  Right Ear: External ear normal.  Left Ear: External ear normal.  Nose: Nose normal.  Mouth/Throat: Oropharynx is clear and moist.  Eyes: Conjunctivae and EOM are normal. Pupils are equal, round, and reactive to light. No scleral icterus.       Vision 20/20 with glasses left right and each eye  Neck: Normal range of motion. Neck supple. No JVD present. No thyromegaly present.  Cardiovascular: Regular rhythm, normal heart sounds and intact distal pulses.  Exam reveals no gallop and no friction rub.   No murmur heard. Pulmonary/Chest: Effort normal and breath sounds normal. He exhibits no tenderness.  Abdominal: Soft. Bowel sounds are normal. He exhibits no distension and no mass. There is no tenderness.  Genitourinary: Prostate normal and penis normal. Guaiac negative stool.       +2 enlarged symmetrical  Musculoskeletal: Normal range of motion. He exhibits no edema and no tenderness.  Lymphadenopathy:    He has no cervical adenopathy.  Neurological: He is alert. He has normal reflexes. No cranial nerve deficit. Coordination normal.  Skin: Skin is warm and dry. No rash noted.  Psychiatric: He has a normal mood and affect. His behavior is normal.          Assessment & Plan:  Preventive health examination Bilateral shoulder pain. Followup orthopedics ADHD prescription for Adderall dispensed. Followup psychiatry BPH stable Osteoarthritis  Recheck one year or as needed DOT form completed  Review of Systems     Objective:   Physical Exam        Assessment & Plan:

## 2012-05-25 ENCOUNTER — Other Ambulatory Visit: Payer: Self-pay | Admitting: Internal Medicine

## 2012-05-26 ENCOUNTER — Other Ambulatory Visit: Payer: Self-pay | Admitting: *Deleted

## 2012-05-29 ENCOUNTER — Telehealth: Payer: Self-pay | Admitting: Internal Medicine

## 2012-05-29 MED ORDER — TADALAFIL 20 MG PO TABS: 20.0000 mg | ORAL_TABLET | Freq: Every day | ORAL | Status: DC | PRN

## 2012-05-29 MED ORDER — NAPROXEN 500 MG PO TBEC: 500.0000 mg | DELAYED_RELEASE_TABLET | Freq: Two times a day (BID) | ORAL | Status: DC

## 2012-05-29 MED ORDER — NIACIN ER (ANTIHYPERLIPIDEMIC) 1000 MG PO TBCR: 1000.0000 mg | EXTENDED_RELEASE_TABLET | Freq: Every day | ORAL | Status: DC

## 2012-05-29 MED ORDER — FLUOXETINE HCL 20 MG PO CAPS: ORAL_CAPSULE | ORAL | Status: DC

## 2012-05-29 MED ORDER — TAMSULOSIN HCL 0.4 MG PO CAPS: 0.4000 mg | ORAL_CAPSULE | Freq: Every day | ORAL | Status: DC

## 2012-05-29 NOTE — Telephone Encounter (Signed)
Pt needs refill of the following: Tamsulosin HCl (FLOMAX) 0.4 MG CAPS  (90 day) naproxen (NAPROXEN DR) 500 MG EC tablet   (90 day) niacin (NIASPAN) 1000 MG CR tablet     (90 day) tadalafil (CIALIS) 20 MG tablet    (60 day)  FLUoxetine (PROZAC) 20 MG capsule   (90 day)    Pt thought this was done at his CPE. Pt would like to pick up these scripts on Monday afternoon.

## 2012-05-29 NOTE — Telephone Encounter (Signed)
Spoke to pt told him refills were done and sent to [facility]. Pt stated he needs them to go to [facility] all except Prozac. Told pt okay will send other refills to [facility]. Pt verbalized understanding. Called [facility] told them to cancel all Rx's sent except for Prozac. Rx's sent to [facility].

## 2012-06-02 ENCOUNTER — Telehealth: Payer: Self-pay | Admitting: Internal Medicine

## 2012-06-02 DIAGNOSIS — G4700 Insomnia, unspecified: Secondary | ICD-10-CM

## 2012-06-02 NOTE — Telephone Encounter (Signed)
Dr Kirtland Bouchard - I received a prior auth request on Mr. Harold Wilson for his zolpidem. I do not see that in [facility] he was on the med. I show it first rx'd in [facility] March 2013 - but it was as a refill. I cannot find anything in the OV notes indicating the zolpidem start. Can you provide me with a documented dx for this gentleman? There is currently nothing on the problem list. Thanks much.

## 2012-06-02 NOTE — Telephone Encounter (Signed)
insomnia

## 2012-06-02 NOTE — Telephone Encounter (Signed)
Lupita Leash, can you add this to the problem list please?

## 2012-06-17 ENCOUNTER — Telehealth: Payer: Self-pay | Admitting: Internal Medicine

## 2012-06-17 NOTE — Telephone Encounter (Signed)
Patient Information:  Caller Name: Harold Wilson  Phone: [phone]  Patient: Harold Wilson, Harold Wilson  Gender: Male  DOB: 1945-08-29  Age: 67 Years  PCP: Eleonore Chiquito [facility])  Office Follow Up:  Does the office need to follow up with this patient?: No  Instructions For The Office: N/A  RN Note:  Pt calling about Right Buttock pain radiating into Right Leg. PT denies injuries.  Pt having trouble sitting and standing.  Pt was seen April 2013 by Elnita Maxwell, Sports Med, Back xray was taken, no diagnosis. Appt scheduled at 1430 on 4-16 w/ Dr Kirtland Bouchard.   Symptoms  Reason For Call & Symptoms: Right Hip Pain radiating into Right Leg  Reviewed Health History In EMR: Yes  Reviewed Medications In EMR: Yes  Reviewed Allergies In EMR: Yes  Reviewed Surgeries / Procedures: Yes  Date of Onset of Symptoms: 05/03/2011  Guideline(s) Used:  Back Pain  Disposition Per Guideline:   See Today or Tomorrow in Office  Reason For Disposition Reached:   Pain radiates into the thigh or further down the leg  Advice Given:  N/A  Patient Will Follow Care Advice:  YES  Appointment Scheduled:  06/18/2012 14:30:00 Appointment Scheduled Provider:  Eleonore Chiquito (Family Practice)

## 2012-06-18 ENCOUNTER — Encounter: Payer: Self-pay | Admitting: Internal Medicine

## 2012-06-18 ENCOUNTER — Ambulatory Visit (INDEPENDENT_AMBULATORY_CARE_PROVIDER_SITE_OTHER): Payer: Managed Care, Other (non HMO) | Admitting: Internal Medicine

## 2012-06-18 VITALS — BP 140/80 | HR 85 | Temp 98.6°F | Resp 18 | Wt 186.0 lb

## 2012-06-18 DIAGNOSIS — M1990 Unspecified osteoarthritis, unspecified site: Secondary | ICD-10-CM

## 2012-06-18 MED ORDER — METHYLPREDNISOLONE ACETATE 80 MG/ML IJ SUSP
80.0000 mg | Freq: Once | INTRAMUSCULAR | Status: AC
  Administered 2012-06-18: 80 mg via INTRAMUSCULAR

## 2012-06-18 NOTE — Patient Instructions (Signed)
Call or return to clinic prn if these symptoms worsen or fail to improve as anticipated.

## 2012-06-18 NOTE — Progress Notes (Signed)
Subjective:    Patient ID: Harold Cellar., male    DOB: 1945/11/16, 67 y.o.   MRN: [id number]  HPI  67 year old patient who presents with the right hip and left wrist pain. He does have a history of osteoarthritis and has been evaluated by Dr. Farris Has in the past. He is also had the neck surgery by Dr. Danielle Dess in 2008. He is left-handed.  Past Medical History  Diagnosis Date  . BENIGN PROSTATIC HYPERTROPHY 02/06/2007  . COLONIC POLYPS, HX OF 02/06/2007  . DIVERTICULOSIS, COLON 02/06/2007  . ERECTILE DYSFUNCTION, NON-ORGANIC, MILD 02/23/2009  . HYPERLIPIDEMIA 02/22/2008  . OSTEOARTHRITIS 02/06/2007    History   Social History  . Marital Status: Married    Spouse Name: N/A    Number of Children: N/A  . Years of Education: N/A   Occupational History  . Not on file.   Social History Main Topics  . Smoking status: Never Smoker   . Smokeless tobacco: Never Used  . Alcohol Use: Yes  . Drug Use: No  . Sexually Active: Not on file   Other Topics Concern  . Not on file   Social History Narrative  . No narrative on file    Past Surgical History  Procedure Laterality Date  . Vasectomy    . Rotator cuff repair    . Tonsillectomy    . Cervical spine surgery    . Temporomandibular joint surgery      Family History  Problem Relation Age of Onset  . COPD Mother   . Heart disease Mother   . Cancer Father     lung ca    Allergies  Allergen Reactions  . Nickel Rash  . Sulfacetamide Sodium     Dryness of mouth     Current Outpatient Prescriptions on File Prior to Visit  Medication Sig Dispense Refill  . AFLURIA PRESERVATIVE FREE injection       . amphetamine-dextroamphetamine (ADDERALL XR) 30 MG 24 hr capsule Take 1 capsule (30 mg total) by mouth every morning.  30 capsule  0  . amphetamine-dextroamphetamine (ADDERALL XR) 30 MG 24 hr capsule Take 1 capsule (30 mg total) by mouth every morning.  30 capsule  0  . amphetamine-dextroamphetamine (ADDERALL XR) 30 MG 24 hr  capsule Take 1 capsule (30 mg total) by mouth every morning.  30 capsule  0  . FLUoxetine (PROZAC) 20 MG capsule TAKE ONE CAPSULE BY MOUTH ONE TIME DAILY  90 capsule  3  . fluticasone (FLONASE) 50 MCG/ACT nasal spray Place 2 sprays into the nose daily.  16 g  6  . naproxen (NAPROXEN DR) 500 MG EC tablet Take 1 tablet (500 mg total) by mouth 2 (two) times daily with a meal.  180 tablet  3  . niacin (NIASPAN) 1000 MG CR tablet Take 1 tablet (1,000 mg total) by mouth at bedtime.  90 tablet  3  . tadalafil (CIALIS) 20 MG tablet Take 1 tablet (20 mg total) by mouth daily as needed.  24 tablet  1  . tamsulosin (FLOMAX) 0.4 MG CAPS Take 1 capsule (0.4 mg total) by mouth daily.  90 capsule  3  . Testosterone (ANDROGEL) 20.25 MG/1.25GM (1.62%) GEL Place 4 application onto the skin once.  1.25 g  4  . zolpidem (AMBIEN) 10 MG tablet Take 1 tablet (10 mg total) by mouth at bedtime as needed for sleep.  30 tablet  2   No current facility-administered medications on file prior to visit.  BP 140/80  Pulse 85  Temp(Src) 98.6 F (37 C) (Oral)  Resp 18  Wt 186 lb (84.369 kg)  BMI 27.87 kg/m2  SpO2 98%       Review of Systems  Constitutional: Negative for fever, chills, appetite change and fatigue.  HENT: Negative for hearing loss, ear pain, congestion, sore throat, trouble swallowing, neck stiffness, dental problem, voice change and tinnitus.   Eyes: Negative for pain, discharge and visual disturbance.  Respiratory: Negative for cough, chest tightness, wheezing and stridor.   Cardiovascular: Negative for chest pain, palpitations and leg swelling.  Gastrointestinal: Negative for nausea, vomiting, abdominal pain, diarrhea, constipation, blood in stool and abdominal distention.  Genitourinary: Negative for urgency, hematuria, flank pain, discharge, difficulty urinating and genital sores.  Musculoskeletal: Positive for back pain and arthralgias. Negative for myalgias, joint swelling and gait problem.   Skin: Negative for rash.  Neurological: Negative for dizziness, syncope, speech difficulty, weakness, numbness and headaches.  Hematological: Negative for adenopathy. Does not bruise/bleed easily.  Psychiatric/Behavioral: Negative for behavioral problems and dysphoric mood. The patient is not nervous/anxious.        Objective:   Physical Exam  Constitutional: He appears well-developed and well-nourished. No distress.  Musculoskeletal:  Straight leg test normal except for some discomfort in the right hamstring Full range of motion right hip No point tenderness  Examination of left wrist revealed no obvious soft tissue swelling or point tenderness          Assessment & Plan:   Right hip pain. Probable mild hip bursitis; doubt advanced arthritis. Will continue naproxen and treat with Depo-Medrol. Will evaluate further if unimproved Left wrist pain. Probable tendinitis will observe

## 2012-07-16 ENCOUNTER — Telehealth: Payer: Self-pay | Admitting: Internal Medicine

## 2012-07-16 MED ORDER — ZOLPIDEM TARTRATE 10 MG PO TABS: 10.0000 mg | ORAL_TABLET | Freq: Every evening | ORAL | Status: DC | PRN

## 2012-07-16 NOTE — Telephone Encounter (Signed)
Pt would like new rx generic adderall xr 30 mg. Pt was seen on 4-17 for hip pain and received cortisone inj. Pt would like to have a muscle relaxer [facility] [address] and a refill on ambien.

## 2012-07-16 NOTE — Telephone Encounter (Signed)
Spoke to pt told him too early for Adderall Rx not due till 08/12/2012 can not pick up till then. Pt verbalized understanding states he just loses track. Told pt will call in Rx for Ambien to pharmacy and I have to send message to Dr. Kirtland Bouchard regarding medicine to relax muscles. Pt verbalized understanding.

## 2012-07-16 NOTE — Telephone Encounter (Signed)
Pt would like a muscle relaxer. Please advise

## 2012-07-16 NOTE — Telephone Encounter (Signed)
Generic Flexeril 10 mg #30 one 3 times a day as needed

## 2012-07-17 MED ORDER — CYCLOBENZAPRINE HCL 10 MG PO TABS: 10.0000 mg | ORAL_TABLET | Freq: Three times a day (TID) | ORAL | Status: DC | PRN

## 2012-07-17 NOTE — Telephone Encounter (Signed)
Spoke to pt told him Rx for Flexeril 10 mg was sent to pharmacy. Pt verbalized understanding.

## 2012-08-26 ENCOUNTER — Telehealth: Payer: Self-pay | Admitting: Internal Medicine

## 2012-08-26 NOTE — Telephone Encounter (Signed)
Pt needs new rx generic adderall xr 30 mg °

## 2012-08-27 MED ORDER — AMPHETAMINE-DEXTROAMPHET ER 30 MG PO CP24: 30.0000 mg | ORAL_CAPSULE | ORAL | Status: DC

## 2012-08-27 NOTE — Telephone Encounter (Signed)
Spoke to pt told him Rx's for Adderall are ready for pick up. Pt verbalized understanding. Rx's printed and signed by Dr. Amador Cunas put at front desk for pick up.

## 2012-09-23 ENCOUNTER — Telehealth: Payer: Self-pay | Admitting: Internal Medicine

## 2012-09-23 NOTE — Telephone Encounter (Signed)
Caller: Nicklous/Patient; Phone: [phone]; Reason for Call: Call regarding Cialis refill.  Pt's insurance, [facility] request 3 months supply, info in EPIC.  Last OV was 06-18-12.  Pt would also like Flexeil refill due to history of Hip/Sciatica pain, last filled on 5-16.  Triage/appt offered, Pt is currently at work and will call back on 7-24 for assessment.  PLEASE REVIEW W/ MD AND F/U W/ PT.

## 2012-09-24 NOTE — Telephone Encounter (Signed)
Flexeril  10  #60  RF 3 Cialis 20  #12  RF 6

## 2012-09-24 NOTE — Telephone Encounter (Signed)
Please advise if okay to refill Cialis and Flexeril as a 90 day supply?

## 2012-09-25 MED ORDER — CYCLOBENZAPRINE HCL 10 MG PO TABS: 10.0000 mg | ORAL_TABLET | Freq: Three times a day (TID) | ORAL | Status: DC | PRN

## 2012-09-25 MED ORDER — TADALAFIL 20 MG PO TABS: 20.0000 mg | ORAL_TABLET | Freq: Every day | ORAL | Status: DC | PRN

## 2012-09-25 NOTE — Telephone Encounter (Signed)
Pt notified Rx refill requests were sent to [facility].

## 2012-10-26 ENCOUNTER — Other Ambulatory Visit: Payer: Self-pay | Admitting: *Deleted

## 2012-10-26 MED ORDER — ZOLPIDEM TARTRATE 10 MG PO TABS: 10.0000 mg | ORAL_TABLET | Freq: Every evening | ORAL | Status: DC | PRN

## 2013-01-27 ENCOUNTER — Other Ambulatory Visit: Payer: Self-pay | Admitting: Internal Medicine

## 2013-02-14 ENCOUNTER — Other Ambulatory Visit: Payer: Self-pay | Admitting: Internal Medicine

## 2013-02-19 ENCOUNTER — Telehealth: Payer: Self-pay | Admitting: Internal Medicine

## 2013-02-19 MED ORDER — AMPHETAMINE-DEXTROAMPHET ER 30 MG PO CP24: 30.0000 mg | ORAL_CAPSULE | ORAL | Status: DC

## 2013-02-19 NOTE — Telephone Encounter (Signed)
Pt need new rx generic adderall xr 30 #30. Pt would like rxs for next 3 month and rx for ambien. Pt will pick up all rxs

## 2013-02-19 NOTE — Telephone Encounter (Signed)
Spoke to pt told him Rx's ready for pickup, will be at the front desk and his Ambien Rx there is refills left. Pt verbalized understanding. Rx's printed and signed.

## 2013-03-12 ENCOUNTER — Telehealth: Payer: Self-pay | Admitting: Internal Medicine

## 2013-03-12 NOTE — Telephone Encounter (Signed)
Left message on voicemail to call office.  

## 2013-03-12 NOTE — Telephone Encounter (Signed)
Please see and advise pt refused to come in.

## 2013-03-12 NOTE — Telephone Encounter (Signed)
Patient Information:  Caller Name: Fayrene FearingJames  Phone: [phone]  Patient: Harold Wilson, Harold Wilson  Gender: Male  DOB: October 03, 1945  Age: 68 Years  PCP: Eleonore ChiquitoKwiatkowski, Peter [facility](Family Practice)  Office Follow Up:  Does the office need to follow up with this patient?: No  Instructions For The Office: N/A   Symptoms  Reason For Call & Symptoms: Pt is calling to see if anything can be called in for a sinus infection. RN advised pt he would need to be seen for abx. He refused. 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 Pt has sinus pain and congestion x 2 days. Pt has a green productive cough.  Reviewed Health History In EMR: Yes  Reviewed Medications In EMR: Yes  Reviewed Allergies In EMR: Yes  Reviewed Surgeries / Procedures: Yes  Date of Onset of Symptoms: 03/10/2013  Guideline(s) Used:  Cough  Disposition Per Guideline:   Go to Office Now  Reason For Disposition Reached:   Wheezing is present  Advice Given:  N/A  Patient Refused Recommendation:  Patient Refused Appt, Patient Requests Appt At Later Date  He refuses appt. RN advised pt be seen. Home care given.

## 2013-03-12 NOTE — Telephone Encounter (Signed)
Acute sinusitis symptoms for less than 10 days are generally not helped by antibiotic therapy.  Use saline irrigation, warm  moist compresses and over-the-counter decongestants only as directed.  Call if there is no improvement in 5 to 7 days, or sooner if you develop increasing pain, fever, or any new symptoms. 

## 2013-03-17 NOTE — Telephone Encounter (Signed)
Spoke to pt told him Dr. Kirtland BouchardK said Acute sinusitis symptoms for less than 10 days are generally not helped by antibiotic therapy. Use saline irrigation, warm moist compresses and over-the-counter decongestants only as directed example Mucinex. Call if there is no improvement in 5 to 7 days, or sooner if you develop increasing pain, fever, or any new symptoms. Pt verbalized understanding.

## 2013-03-18 ENCOUNTER — Telehealth: Payer: Self-pay | Admitting: Internal Medicine

## 2013-03-18 ENCOUNTER — Ambulatory Visit (INDEPENDENT_AMBULATORY_CARE_PROVIDER_SITE_OTHER): Payer: BC Managed Care – PPO | Admitting: Family Medicine

## 2013-03-18 ENCOUNTER — Encounter: Payer: Self-pay | Admitting: Family Medicine

## 2013-03-18 VITALS — BP 136/82 | HR 74 | Temp 98.2°F | Wt 182.0 lb

## 2013-03-18 DIAGNOSIS — J019 Acute sinusitis, unspecified: Secondary | ICD-10-CM

## 2013-03-18 MED ORDER — HYDROCODONE-HOMATROPINE 5-1.5 MG/5ML PO SYRP: 5.0000 mL | ORAL_SOLUTION | Freq: Four times a day (QID) | ORAL | Status: AC | PRN

## 2013-03-18 MED ORDER — AMOXICILLIN-POT CLAVULANATE 875-125 MG PO TABS: 1.0000 | ORAL_TABLET | Freq: Two times a day (BID) | ORAL | Status: DC

## 2013-03-18 NOTE — Telephone Encounter (Signed)
Patient Information:  Caller Name: Fayrene FearingJames  Phone: [phone]  Patient: Harold Wilson, Harold Wilson  Gender: Male  DOB: 20-Sep-1945  Age: 68 Years  PCP: Eleonore ChiquitoKwiatkowski, Peter [facility](Family Practice)  Office Follow Up:  Does the office need to follow up with this patient?: No  Instructions For The Office: N/A  RN Note:  Cough started several weeks ago and persists despite using OTC products.  States he feels some "raspiness" in his chest and wonders if he has bronchitis.  States has temp/tactile.  Per cough protocol, advised appt due to cough with presence of fever and age > 160; appt scheduled 03/18/13 1445 with Dr. Caryl NeverBurchette.  krs/can  Symptoms  Reason For Call & Symptoms: chest cough and congestion  Reviewed Health History In EMR: Yes  Reviewed Medications In EMR: Yes  Reviewed Allergies In EMR: Yes  Reviewed Surgeries / Procedures: Yes  Date of Onset of Symptoms: 02/18/2013  Guideline(s) Used:  Cough  Disposition Per Guideline:   Go to Office Now  Reason For Disposition Reached:   Fever > 100.5 F (38.1 C) and over 66 years of age  Advice Given:  N/A  Patient Will Follow Care Advice:  YES  Appointment Scheduled:  03/18/2013 14:45:00 Appointment Scheduled Provider:  Evelena PeatBurchette, Bruce (Family Practice)

## 2013-03-18 NOTE — Patient Instructions (Signed)

## 2013-03-18 NOTE — Progress Notes (Signed)
Pre visit review using our clinic review tool, if applicable. No additional management support is needed unless otherwise documented below in the visit note. 

## 2013-03-18 NOTE — Progress Notes (Signed)
   Subjective:    Patient ID: Harold CellarJames B Katt Jr., male    DOB: 1945/12/10, 68 y.o.   MRN: [id number]  HPI Acute visit. Patient seen with almost 2 week history of sore throat, nasal congestion, intermittent facial pain, malaise, cough which has become productive. Occasional mild hoarseness. Cough is especially bothersome at night. Denies any fevers or chills. Has had some sinus difficulties in the past.  No dyspnea and no obvious wheezing.  Nonsmoker.  Past Medical History  Diagnosis Date  . BENIGN PROSTATIC HYPERTROPHY 02/06/2007  . COLONIC POLYPS, HX OF 02/06/2007  . DIVERTICULOSIS, COLON 02/06/2007  . ERECTILE DYSFUNCTION, NON-ORGANIC, MILD 02/23/2009  . HYPERLIPIDEMIA 02/22/2008  . OSTEOARTHRITIS 02/06/2007   Past Surgical History  Procedure Laterality Date  . Vasectomy    . Rotator cuff repair    . Tonsillectomy    . Cervical spine surgery    . Temporomandibular joint surgery      reports that he has never smoked. He has never used smokeless tobacco. He reports that he drinks alcohol. He reports that he does not use illicit drugs. family history includes COPD in his mother; Cancer in his father; Heart disease in his mother. Allergies  Allergen Reactions  . Nickel Rash  . Sulfacetamide Sodium     Dryness of mouth       Review of Systems  Constitutional: Positive for fatigue. Negative for fever and chills.  HENT: Positive for congestion and sinus pressure.   Respiratory: Positive for cough.   Neurological: Positive for headaches.       Objective:   Physical Exam  Constitutional: He appears well-developed and well-nourished.  HENT:  Nose: Nose normal.  Mouth/Throat: Oropharynx is clear and moist.  Neck: Neck supple.  Cardiovascular: Normal rate.   Pulmonary/Chest: Effort normal and breath sounds normal. No respiratory distress. He has no wheezes. He has no rales.          Assessment & Plan:  Acute sinusitis. Augmentin 875 mg twice a day for 10 days. Hydrate  well. Hycodan cough syrup 1 teaspoon each bedtime for severe cough at night

## 2013-04-02 ENCOUNTER — Other Ambulatory Visit (INDEPENDENT_AMBULATORY_CARE_PROVIDER_SITE_OTHER): Payer: BC Managed Care – PPO

## 2013-04-02 DIAGNOSIS — Z0000 Encounter for general adult medical examination without abnormal findings: Secondary | ICD-10-CM

## 2013-04-02 LAB — POCT URINALYSIS DIPSTICK
Bilirubin, UA: NEGATIVE
Blood, UA: NEGATIVE
Glucose, UA: NEGATIVE
KETONES UA: NEGATIVE
Leukocytes, UA: NEGATIVE
Nitrite, UA: NEGATIVE
PH UA: 6.5
PROTEIN UA: NEGATIVE
Spec Grav, UA: 1.015
Urobilinogen, UA: 0.2

## 2013-04-02 LAB — HEPATIC FUNCTION PANEL
ALT: 15 U/L (ref 0–53)
AST: 17 U/L (ref 0–37)
Albumin: 3.8 g/dL (ref 3.5–5.2)
Alkaline Phosphatase: 42 U/L (ref 39–117)
BILIRUBIN DIRECT: 0.1 mg/dL (ref 0.0–0.3)
Total Bilirubin: 1.1 mg/dL (ref 0.3–1.2)
Total Protein: 5.9 g/dL — ABNORMAL LOW (ref 6.0–8.3)

## 2013-04-02 LAB — CBC WITH DIFFERENTIAL/PLATELET
BASOS ABS: 0 10*3/uL (ref 0.0–0.1)
Basophils Relative: 0.5 % (ref 0.0–3.0)
EOS PCT: 2.3 % (ref 0.0–5.0)
Eosinophils Absolute: 0.1 10*3/uL (ref 0.0–0.7)
HCT: 41.3 % (ref 39.0–52.0)
Hemoglobin: 13.6 g/dL (ref 13.0–17.0)
Lymphocytes Relative: 25.9 % (ref 12.0–46.0)
Lymphs Abs: 0.8 10*3/uL (ref 0.7–4.0)
MCHC: 33 g/dL (ref 30.0–36.0)
MCV: 90.8 fl (ref 78.0–100.0)
MONO ABS: 0.3 10*3/uL (ref 0.1–1.0)
Monocytes Relative: 8.9 % (ref 3.0–12.0)
NEUTROS PCT: 62.4 % (ref 43.0–77.0)
Neutro Abs: 2 10*3/uL (ref 1.4–7.7)
PLATELETS: 156 10*3/uL (ref 150.0–400.0)
RBC: 4.55 Mil/uL (ref 4.22–5.81)
RDW: 13.3 % (ref 11.5–14.6)
WBC: 3.3 10*3/uL — AB (ref 4.5–10.5)

## 2013-04-02 LAB — BASIC METABOLIC PANEL
BUN: 12 mg/dL (ref 6–23)
CO2: 27 mEq/L (ref 19–32)
Calcium: 8.3 mg/dL — ABNORMAL LOW (ref 8.4–10.5)
Chloride: 108 mEq/L (ref 96–112)
Creatinine, Ser: 1 mg/dL (ref 0.4–1.5)
GFR: 79.68 mL/min (ref >60.00)
GLUCOSE: 81 mg/dL (ref 70–99)
POTASSIUM: 4 meq/L (ref 3.5–5.1)
SODIUM: 139 meq/L (ref 135–145)

## 2013-04-02 LAB — LIPID PANEL
CHOLESTEROL: 113 mg/dL (ref 0–200)
HDL: 36.3 mg/dL — AB (ref >39.00)
LDL Cholesterol: 72 mg/dL (ref 0–99)
TRIGLYCERIDES: 26 mg/dL (ref 0.0–149.0)
Total CHOL/HDL Ratio: 3
VLDL: 5.2 mg/dL (ref 0.0–40.0)

## 2013-04-02 LAB — TSH: TSH: 1.79 u[IU]/mL (ref 0.35–5.50)

## 2013-04-02 LAB — PSA: PSA: 1.06 ng/mL (ref 0.10–4.00)

## 2013-04-09 ENCOUNTER — Telehealth: Payer: Self-pay | Admitting: Internal Medicine

## 2013-04-09 ENCOUNTER — Encounter: Payer: Managed Care, Other (non HMO) | Admitting: Internal Medicine

## 2013-04-09 DIAGNOSIS — Z0289 Encounter for other administrative examinations: Secondary | ICD-10-CM

## 2013-04-09 NOTE — Telephone Encounter (Signed)
Please schedule may use two slots together if need to.

## 2013-04-09 NOTE — Telephone Encounter (Signed)
Pt missed appt today at 1:15pm due to wife being very ill. Pt states he has to have the physical by 06/01/13 to be in compliance with insurance plan, pt asking if any way dr. Kirtland Bouchardk can see him for a physical by 06/01/13

## 2013-04-12 NOTE — Telephone Encounter (Signed)
appt scheduled for pt.  

## 2013-04-20 ENCOUNTER — Encounter: Payer: BC Managed Care – PPO | Admitting: Internal Medicine

## 2013-04-22 ENCOUNTER — Encounter: Payer: Self-pay | Admitting: Internal Medicine

## 2013-04-22 ENCOUNTER — Ambulatory Visit (INDEPENDENT_AMBULATORY_CARE_PROVIDER_SITE_OTHER): Payer: BC Managed Care – PPO | Admitting: Internal Medicine

## 2013-04-22 VITALS — BP 160/90 | HR 82 | Temp 98.4°F | Resp 20 | Ht 68.5 in | Wt 189.0 lb

## 2013-04-22 DIAGNOSIS — Z23 Encounter for immunization: Secondary | ICD-10-CM

## 2013-04-22 DIAGNOSIS — K5730 Diverticulosis of large intestine without perforation or abscess without bleeding: Secondary | ICD-10-CM

## 2013-04-22 DIAGNOSIS — M1990 Unspecified osteoarthritis, unspecified site: Secondary | ICD-10-CM

## 2013-04-22 DIAGNOSIS — E785 Hyperlipidemia, unspecified: Secondary | ICD-10-CM

## 2013-04-22 DIAGNOSIS — Z86010 Personal history of colonic polyps: Secondary | ICD-10-CM

## 2013-04-22 DIAGNOSIS — Z0000 Encounter for general adult medical examination without abnormal findings: Secondary | ICD-10-CM

## 2013-04-22 DIAGNOSIS — F909 Attention-deficit hyperactivity disorder, unspecified type: Secondary | ICD-10-CM

## 2013-04-22 MED ORDER — AMPHETAMINE-DEXTROAMPHET ER 30 MG PO CP24: 30.0000 mg | ORAL_CAPSULE | ORAL | Status: DC

## 2013-04-22 MED ORDER — ZOLPIDEM TARTRATE 10 MG PO TABS: ORAL_TABLET | ORAL | Status: DC

## 2013-04-22 NOTE — Patient Instructions (Addendum)
Limit your sodium (Salt) intake    It is important that you exercise regularly, at least 20 minutes 3 to 4 times per week.  If you develop chest pain or shortness of breath seek  medical attention.  Please check your blood pressure on a regular basis.  If it is consistently greater than 150/90, please make an office appointment.  Return in one year for follow-up   Schedule your colonoscopy to help detect colon cancer.

## 2013-04-22 NOTE — Progress Notes (Signed)
Pre-visit discussion using our clinic review tool. No additional management support is needed unless otherwise documented below in the visit note.  

## 2013-04-22 NOTE — Progress Notes (Signed)
Subjective:    Patient ID: Harold Cellar., male    DOB: 1945/09/14, 68 y.o.   MRN: [id number]  HPI   Subjective:    Patient ID: Harold Wilson, male    DOB: [birth year], 68 y.o.   MRN: [id number]  HPI  68 year-old patient who is seen today for a wellness exam.  He is doing quite well without complaints complaints except for some mild right hip discomfort. He is also seeing Dr. Evelene Croon. He has osteoarthritis history of colonic polyps and a history of BPH he has mild dyslipidemia. He is otherwise doing well He is overdue for colonoscopy   Review of Systems  Constitutional: Negative for fever, chills, activity change, appetite change and fatigue.  HENT: Negative for hearing loss, ear pain, congestion, rhinorrhea, sneezing, mouth sores, trouble swallowing, neck pain, neck stiffness, dental problem, voice change, sinus pressure and tinnitus.   Eyes: Negative for photophobia, pain, redness and visual disturbance.  Respiratory: Negative for apnea, cough, choking, chest tightness, shortness of breath and wheezing.   Cardiovascular: Negative for chest pain, palpitations and leg swelling.  Gastrointestinal: Negative for nausea, vomiting, abdominal pain, diarrhea, constipation, blood in stool, abdominal distention, anal bleeding and rectal pain.  Genitourinary: Negative for dysuria, urgency, frequency, hematuria, flank pain, decreased urine volume, discharge, penile swelling, scrotal swelling, difficulty urinating, genital sores and testicular pain.  Musculoskeletal: Positive for arthralgias (bilateral shoulder pain). Negative for myalgias, back pain, joint swelling and gait problem.  Skin: Negative for color change, rash and wound.  Neurological: Negative for dizziness, tremors, seizures, syncope, facial asymmetry, speech difficulty, weakness, light-headedness, numbness and headaches.  Hematological: Negative for adenopathy. Does not bruise/bleed easily.  Psychiatric/Behavioral: Negative for  suicidal ideas, hallucinations, behavioral problems, confusion, sleep disturbance, self-injury, dysphoric mood, decreased concentration and agitation. The patient is not nervous/anxious.     BP Readings from Last 3 Encounters:  04/22/13 160/90  03/18/13 136/82  06/18/12 140/80       Objective:   Physical Exam  Constitutional: He appears well-developed and well-nourished.  HENT:  Head: Normocephalic and atraumatic.  Right Ear: External ear normal.  Left Ear: External ear normal.  Nose: Nose normal.  Mouth/Throat: Oropharynx is clear and moist.  Eyes: Conjunctivae and EOM are normal. Pupils are equal, round, and reactive to light. No scleral icterus.       Vision 20/20 with glasses left right and each eye  Neck: Normal range of motion. Neck supple. No JVD present. No thyromegaly present.  Cardiovascular: Regular rhythm, normal heart sounds and intact distal pulses.  Exam reveals no gallop and no friction rub.   No murmur heard. Pulmonary/Chest: Effort normal and breath sounds normal. He exhibits no tenderness.  Abdominal: Soft. Bowel sounds are normal. He exhibits no distension and no mass. There is no tenderness.  Genitourinary: Prostate normal and penis normal. Guaiac negative stool.       +2 enlarged symmetrical  Musculoskeletal: Normal range of motion. He exhibits no edema and no tenderness.  Lymphadenopathy:    He has no cervical adenopathy.  Neurological: He is alert. He has normal reflexes. No cranial nerve deficit. Coordination normal.  Skin: Skin is warm and dry. No rash noted.  Psychiatric: He has a normal mood and affect. His behavior is normal.          Assessment & Plan:  Preventive health examination Bilateral shoulder pain. Followup orthopedics ADHD prescription for Adderall dispensed. Followup psychiatry BPH stable Osteoarthritis  Recheck one year or as needed DOT form  completed   Review of Systems  Musculoskeletal:       Right hip discomfort        Objective:   Physical Exam  HENT:  Hearing aids in place   blood pressure 140/90 Diminished right dorsalis pedis pulse       Assessment & Plan:   Low-salt diet recommended.  Home blood pressure monitoring.  Encouraged. Followup colonoscopy recommended with Dr. Matthias HughsBuccini. Adderall refilled  Dyslipidemia.  HDL cholesterol much improved

## 2013-06-07 ENCOUNTER — Telehealth: Payer: Self-pay | Admitting: Internal Medicine

## 2013-06-07 NOTE — Telephone Encounter (Addendum)
Pt needs refill on generic prozac 20 mg #90 with refills sent to [facility]. Pt also needs new rxs fax to [facility] [phone] zolpidem 10 mg #90,cialis 20 mg #18,niacin 1000 mg #90,flonase #3 each med with refills

## 2013-06-08 MED ORDER — TAMSULOSIN HCL 0.4 MG PO CAPS: 0.4000 mg | ORAL_CAPSULE | Freq: Every day | ORAL | Status: DC

## 2013-06-08 MED ORDER — NAPROXEN 500 MG PO TBEC: 500.0000 mg | DELAYED_RELEASE_TABLET | Freq: Two times a day (BID) | ORAL | Status: DC

## 2013-06-08 MED ORDER — TADALAFIL 20 MG PO TABS: 20.0000 mg | ORAL_TABLET | Freq: Every day | ORAL | Status: DC | PRN

## 2013-06-08 MED ORDER — FLUTICASONE PROPIONATE 50 MCG/ACT NA SUSP: 2.0000 | Freq: Every day | NASAL | Status: DC

## 2013-06-08 MED ORDER — NIACIN ER (ANTIHYPERLIPIDEMIC) 1000 MG PO TBCR: 1000.0000 mg | EXTENDED_RELEASE_TABLET | Freq: Every day | ORAL | Status: DC

## 2013-06-08 MED ORDER — CYCLOBENZAPRINE HCL 10 MG PO TABS: 10.0000 mg | ORAL_TABLET | Freq: Three times a day (TID) | ORAL | Status: DC | PRN

## 2013-06-08 MED ORDER — FLUOXETINE HCL 20 MG PO CAPS: ORAL_CAPSULE | ORAL | Status: DC

## 2013-06-08 NOTE — Telephone Encounter (Signed)
Spoke to pt told him can send refills to [facility] except Zolpidem due to controlled and should have refill at pharmacy yet. Pt verbalized understanding. [facility]

## 2013-06-23 ENCOUNTER — Telehealth: Payer: Self-pay | Admitting: Internal Medicine

## 2013-06-23 NOTE — Telephone Encounter (Signed)
[employer] [place] denied the PA request for Cialis.  They require an AUA-SI SCORE of 8 or greater and there isn't a score documented in the pt's chart to give.

## 2013-06-23 NOTE — Telephone Encounter (Signed)
Please see message and advise 

## 2013-08-01 ENCOUNTER — Other Ambulatory Visit: Payer: Self-pay | Admitting: Internal Medicine

## 2013-08-13 ENCOUNTER — Telehealth: Payer: Self-pay | Admitting: Internal Medicine

## 2013-08-13 MED ORDER — TADALAFIL 5 MG PO TABS: 5.0000 mg | ORAL_TABLET | Freq: Every day | ORAL | Status: DC | PRN

## 2013-08-13 NOTE — Telephone Encounter (Signed)
Left message on voicemail to call office.  

## 2013-08-13 NOTE — Telephone Encounter (Signed)
Pt called back, told him Dr. Kirtland BouchardK is out of the office until Monday so I can not fill Rx for Diflucan and Adderall will have to wait till Monday also. Pt verbalized understanding. Told pt will send Rx for Cialis to pharmacy for him. Pt verbalized understanding.

## 2013-08-13 NOTE — Telephone Encounter (Signed)
Pt needs new rx generic adderall xr 30 mg. Pt wife was dx with yeast inf and given 7 diflucan 200 mg pill. Pt needs ?dlflucan call into [facility]. Pt also would like cialis 5 mg # 30 call into pharm

## 2013-08-16 ENCOUNTER — Telehealth: Payer: Self-pay | Admitting: Internal Medicine

## 2013-08-16 MED ORDER — AMPHETAMINE-DEXTROAMPHET ER 30 MG PO CP24: 30.0000 mg | ORAL_CAPSULE | ORAL | Status: DC

## 2013-08-16 MED ORDER — FLUCONAZOLE 150 MG PO TABS: 150.0000 mg | ORAL_TABLET | Freq: Once | ORAL | Status: DC

## 2013-08-16 NOTE — Telephone Encounter (Signed)
Okay to fill Diflucan?

## 2013-08-16 NOTE — Telephone Encounter (Signed)
Rx ready for pick up.  Patient is aware.

## 2013-08-16 NOTE — Telephone Encounter (Signed)
[employer] denied Cialis for pt because they require a AUA-SI score of 8 or greater. I will send the denial letter back for review before it is sent to scanning.

## 2013-08-16 NOTE — Telephone Encounter (Signed)
Rx ready.

## 2013-08-16 NOTE — Telephone Encounter (Signed)
ok 

## 2013-08-17 NOTE — Telephone Encounter (Signed)
Noted, pt aware it was denied.

## 2013-08-26 ENCOUNTER — Telehealth: Payer: Self-pay | Admitting: *Deleted

## 2013-08-26 MED ORDER — FLUCONAZOLE 200 MG PO TABS: 200.0000 mg | ORAL_TABLET | Freq: Every day | ORAL | Status: DC

## 2013-08-26 NOTE — Telephone Encounter (Signed)
Spoke to pt told him Dr. Kirtland BouchardK reviewed the note he dropped off and will send Rx for Diflucan 200 mg one tablet daily x 7 days. Pt verbalized understanding. Rx sent

## 2013-09-24 ENCOUNTER — Other Ambulatory Visit: Payer: Self-pay | Admitting: Internal Medicine

## 2013-09-30 ENCOUNTER — Telehealth: Payer: Self-pay | Admitting: Internal Medicine

## 2013-09-30 NOTE — Telephone Encounter (Signed)
[facility] - [place], [place] - [address] sent a PA request for Cialis 20mg , qty 36.  I see the pt was given a nex RX in June for Cialis 5mg , qty 30, which is current and correct??

## 2013-09-30 NOTE — Telephone Encounter (Signed)
Pt is taking Cialis 5 mg, #30 sent.

## 2013-11-10 ENCOUNTER — Telehealth: Payer: Self-pay | Admitting: Internal Medicine

## 2013-11-10 MED ORDER — AMPHETAMINE-DEXTROAMPHET ER 30 MG PO CP24: 30.0000 mg | ORAL_CAPSULE | ORAL | Status: DC

## 2013-11-10 NOTE — Telephone Encounter (Signed)
Adderall 10 mg, #90, one 3 times daily

## 2013-11-10 NOTE — Telephone Encounter (Signed)
Pt would like new rx generic adderall not xr 30 mg. Pt would like plain generic adderall 30 mg to take three times a day#90

## 2013-11-10 NOTE — Telephone Encounter (Signed)
Spoke to pt, told him Dr.K can change to Adderall 10 mg three times a day to equal what the 30 mg XR is. Pt said he thought he was on 30 mg three times a day before of regular Adderall. Told pt that would be three times the dose you are on and can not do that. Pt said okay just keep Adderall XR 30 mg. Told pt okay will have Rx's ready for tomorrow for pickup. Pt verbalized understanding. Rx's printed and signed.

## 2013-11-10 NOTE — Telephone Encounter (Signed)
Please advise if okay to change prescription?

## 2014-01-10 ENCOUNTER — Other Ambulatory Visit: Payer: Self-pay | Admitting: Internal Medicine

## 2014-01-20 ENCOUNTER — Other Ambulatory Visit: Payer: Self-pay | Admitting: Internal Medicine

## 2014-03-22 ENCOUNTER — Telehealth: Payer: Self-pay | Admitting: Internal Medicine

## 2014-03-22 MED ORDER — AMPHETAMINE-DEXTROAMPHETAMINE 10 MG PO TABS: 10.0000 mg | ORAL_TABLET | Freq: Three times a day (TID) | ORAL | Status: DC

## 2014-03-22 NOTE — Telephone Encounter (Signed)
Pt wanted the 20 mg but per Dr Kirtland BouchardK if he puts him on Adderall 20 mg than he will be getting more than the 30 mg.  So he wants pt to start with 10 mg tid and if he needs to titrate up then he can change.  Pt aware.  He will pick up the 10 mg.  CPX is already scheduled for 05/09/14

## 2014-03-22 NOTE — Telephone Encounter (Signed)
Please advise 

## 2014-03-22 NOTE — Telephone Encounter (Signed)
Pt request refill amphetamine-dextroamphetamine (ADDERALL XR) 30 MG 24 hr capsule  EXCEPT pt would like to switch to the regular adderall, not extended release , 20 mg  3 x /day

## 2014-03-22 NOTE — Telephone Encounter (Signed)
Adderall 10 mg #90 one 3 times a day   .  Please refill for 3 months Schedule CPX in March

## 2014-05-02 ENCOUNTER — Other Ambulatory Visit (INDEPENDENT_AMBULATORY_CARE_PROVIDER_SITE_OTHER): Payer: BLUE CROSS/BLUE SHIELD

## 2014-05-02 DIAGNOSIS — Z0000 Encounter for general adult medical examination without abnormal findings: Secondary | ICD-10-CM

## 2014-05-02 LAB — CBC WITH DIFFERENTIAL/PLATELET
BASOS PCT: 0.7 % (ref 0.0–3.0)
Basophils Absolute: 0 10*3/uL (ref 0.0–0.1)
Eosinophils Absolute: 0.2 10*3/uL (ref 0.0–0.7)
Eosinophils Relative: 4.7 % (ref 0.0–5.0)
HCT: 42.6 % (ref 39.0–52.0)
HEMOGLOBIN: 14.5 g/dL (ref 13.0–17.0)
LYMPHS PCT: 24.7 % (ref 12.0–46.0)
Lymphs Abs: 1.2 10*3/uL (ref 0.7–4.0)
MCHC: 34 g/dL (ref 30.0–36.0)
MCV: 89.7 fl (ref 78.0–100.0)
MONOS PCT: 9.7 % (ref 3.0–12.0)
Monocytes Absolute: 0.5 10*3/uL (ref 0.1–1.0)
NEUTROS ABS: 2.9 10*3/uL (ref 1.4–7.7)
NEUTROS PCT: 60.2 % (ref 43.0–77.0)
Platelets: 169 10*3/uL (ref 150.0–400.0)
RBC: 4.75 Mil/uL (ref 4.22–5.81)
RDW: 13.3 % (ref 11.5–15.5)
WBC: 4.8 10*3/uL (ref 4.0–10.5)

## 2014-05-02 LAB — BASIC METABOLIC PANEL
BUN: 16 mg/dL (ref 6–23)
CO2: 29 mEq/L (ref 19–32)
Calcium: 8.7 mg/dL (ref 8.4–10.5)
Chloride: 109 mEq/L (ref 96–112)
Creatinine, Ser: 1.16 mg/dL (ref 0.40–1.50)
GFR: 66.91 mL/min (ref >60.00)
Glucose, Bld: 91 mg/dL (ref 70–99)
Potassium: 4.5 mEq/L (ref 3.5–5.1)
SODIUM: 142 meq/L (ref 135–145)

## 2014-05-02 LAB — POCT URINALYSIS DIPSTICK
Bilirubin, UA: NEGATIVE
Glucose, UA: NEGATIVE
Ketones, UA: NEGATIVE
Leukocytes, UA: NEGATIVE
NITRITE UA: NEGATIVE
PH UA: 6
PROTEIN UA: NEGATIVE
RBC UA: NEGATIVE
Spec Grav, UA: 1.015
Urobilinogen, UA: 0.2

## 2014-05-02 LAB — TSH: TSH: 1.7 u[IU]/mL (ref 0.35–4.50)

## 2014-05-02 LAB — HEPATIC FUNCTION PANEL
ALBUMIN: 4 g/dL (ref 3.5–5.2)
ALT: 18 U/L (ref 0–53)
AST: 16 U/L (ref 0–37)
Alkaline Phosphatase: 58 U/L (ref 39–117)
Bilirubin, Direct: 0.1 mg/dL (ref 0.0–0.3)
Total Bilirubin: 0.6 mg/dL (ref 0.2–1.2)
Total Protein: 6.2 g/dL (ref 6.0–8.3)

## 2014-05-02 LAB — LIPID PANEL
CHOL/HDL RATIO: 4
Cholesterol: 149 mg/dL (ref 0–200)
HDL: 36.5 mg/dL — AB (ref >39.00)
LDL CALC: 98 mg/dL (ref 0–99)
NONHDL: 112.5
Triglycerides: 75 mg/dL (ref 0.0–149.0)
VLDL: 15 mg/dL (ref 0.0–40.0)

## 2014-05-02 LAB — PSA: PSA: 1.21 ng/mL (ref 0.10–4.00)

## 2014-05-06 ENCOUNTER — Other Ambulatory Visit: Payer: Self-pay

## 2014-05-09 ENCOUNTER — Encounter: Payer: Self-pay | Admitting: Internal Medicine

## 2014-05-09 ENCOUNTER — Encounter: Payer: Self-pay | Admitting: *Deleted

## 2014-05-09 ENCOUNTER — Ambulatory Visit (INDEPENDENT_AMBULATORY_CARE_PROVIDER_SITE_OTHER): Payer: BLUE CROSS/BLUE SHIELD | Admitting: Internal Medicine

## 2014-05-09 VITALS — BP 138/90 | HR 106 | Temp 98.9°F | Resp 20 | Ht 67.25 in | Wt 189.0 lb

## 2014-05-09 DIAGNOSIS — F528 Other sexual dysfunction not due to a substance or known physiological condition: Secondary | ICD-10-CM

## 2014-05-09 DIAGNOSIS — Z0000 Encounter for general adult medical examination without abnormal findings: Secondary | ICD-10-CM

## 2014-05-09 DIAGNOSIS — Z860100 Personal history of colon polyps, unspecified: Secondary | ICD-10-CM

## 2014-05-09 DIAGNOSIS — M159 Polyosteoarthritis, unspecified: Secondary | ICD-10-CM

## 2014-05-09 DIAGNOSIS — F900 Attention-deficit hyperactivity disorder, predominantly inattentive type: Secondary | ICD-10-CM

## 2014-05-09 DIAGNOSIS — Z23 Encounter for immunization: Secondary | ICD-10-CM

## 2014-05-09 DIAGNOSIS — M150 Primary generalized (osteo)arthritis: Secondary | ICD-10-CM

## 2014-05-09 MED ORDER — AMPHETAMINE-DEXTROAMPHETAMINE 20 MG PO TABS: ORAL_TABLET | ORAL | Status: DC

## 2014-05-09 MED ORDER — TADALAFIL 5 MG PO TABS: ORAL_TABLET | ORAL | Status: DC

## 2014-05-09 MED ORDER — ZOLPIDEM TARTRATE 10 MG PO TABS: 10.0000 mg | ORAL_TABLET | Freq: Every evening | ORAL | Status: DC | PRN

## 2014-05-09 MED ORDER — TESTOSTERONE 20.25 MG/1.25GM (1.62%) TD GEL: 4.0000 "application " | Freq: Once | TRANSDERMAL | Status: DC

## 2014-05-09 MED ORDER — TADALAFIL 5 MG PO TABS
ORAL_TABLET | ORAL | Status: DC
Start: 2014-05-09 — End: 2016-09-09

## 2014-05-09 MED ORDER — TADALAFIL 5 MG PO TABS
ORAL_TABLET | ORAL | Status: DC
Start: 2014-05-09 — End: 2014-05-09

## 2014-05-09 NOTE — Progress Notes (Signed)
Pre visit review using our clinic review tool, if applicable. No additional management support is needed unless otherwise documented below in the visit note. 

## 2014-05-09 NOTE — Patient Instructions (Signed)

## 2014-05-09 NOTE — Progress Notes (Signed)
Subjective:    Patient ID: Harold CellarJames B Schermerhorn Jr., male    DOB: Jul 20, 1945, 69 y.o.   MRN: [id number]  HPI   Subjective:    Patient ID: Harold Wilson, male    DOB: Jul 20, 1945, 69 y.o.   MRN: [id number]  HPI  69 year-old patient who is seen today for a wellness exam.  He is doing quite well; he does have a history of BPH and has nocturia times 3. He is also seeing Dr. Evelene CroonKaur. He has osteoarthritis history of colonic polyps;  he has mild dyslipidemia. He is otherwise doing well Last colonoscopy was performed June 2015. He feels is present Adderall dose at 10 mg 3 times a day has been inadequate.  He has done well on 20 mg 3 times a day in the past   Review of Systems  Constitutional: Negative for fever, chills, activity change, appetite change and fatigue.  HENT: Negative for hearing loss, ear pain, congestion, rhinorrhea, sneezing, mouth sores, trouble swallowing, neck pain, neck stiffness, dental problem, voice change, sinus pressure and tinnitus.   Eyes: Negative for photophobia, pain, redness and visual disturbance.  Respiratory: Negative for apnea, cough, choking, chest tightness, shortness of breath and wheezing.   Cardiovascular: Negative for chest pain, palpitations and leg swelling.  Gastrointestinal: Negative for nausea, vomiting, abdominal pain, diarrhea, constipation, blood in stool, abdominal distention, anal bleeding and rectal pain.  Genitourinary: Negative for dysuria, urgency, frequency, hematuria, flank pain, decreased urine volume, discharge, penile swelling, scrotal swelling, difficulty urinating, genital sores and testicular pain.  Musculoskeletal: Positive for arthralgias (bilateral shoulder pain). Negative for myalgias, back pain, joint swelling and gait problem.  Skin: Negative for color change, rash and wound.  Neurological: Negative for dizziness, tremors, seizures, syncope, facial asymmetry, speech difficulty, weakness, light-headedness, numbness and headaches.   Hematological: Negative for adenopathy. Does not bruise/bleed easily.  Psychiatric/Behavioral: Negative for suicidal ideas, hallucinations, behavioral problems, confusion, sleep disturbance, self-injury, dysphoric mood, decreased concentration and agitation. The patient is not nervous/anxious.     BP Readings from Last 3 Encounters:  05/09/14 138/90  04/22/13 160/90  03/18/13 136/82       Objective:   Physical Exam  Constitutional: He appears well-developed and well-nourished.  HENT:  Head: Normocephalic and atraumatic.  Right Ear: External ear normal.  Left Ear: External ear normal.  Nose: Nose normal.  Mouth/Throat: Oropharynx is clear and moist.  Eyes: Conjunctivae and EOM are normal. Pupils are equal, round, and reactive to light. No scleral icterus.       Vision 20/20 with glasses left right and each eye  Neck: Normal range of motion. Neck supple. No JVD present. No thyromegaly present.  Cardiovascular: Regular rhythm, normal heart sounds and intact distal pulses.  Exam reveals no gallop and no friction rub.   No murmur heard. Pulmonary/Chest: Effort normal and breath sounds normal. He exhibits no tenderness.  Abdominal: Soft. Bowel sounds are normal. He exhibits no distension and no mass. There is no tenderness.  Genitourinary: Prostate normal and penis normal. Guaiac negative stool.       +2 enlarged symmetrical  Musculoskeletal: Normal range of motion. He exhibits no edema and no tenderness.  Lymphadenopathy:    He has no cervical adenopathy.  Neurological: He is alert. He has normal reflexes. No cranial nerve deficit. Coordination normal.  Skin: Skin is warm and dry. No rash noted.  Psychiatric: He has a normal mood and affect. His behavior is normal.          Assessment &  Plan:  Preventive health examination Bilateral shoulder pain. Followup orthopedics ADHD prescription for Adderall dispensed. Followup psychiatry BPH stable Osteoarthritis  Recheck one year  or as needed DOT form completed   Review of Systems  Musculoskeletal:       Right hip discomfort       Objective:   Physical Exam  HENT:  Hearing aids in place   blood pressure 140/90 Diminished right dorsalis pedis pulse       Assessment & Plan:  Preventive health care Low-salt diet recommended.  Home blood pressure monitoring.  Encouraged. Osteoarthritis ADHD -Adderall refilled.  Will increase dose to 20 mg 3 times a day History of colonic polyps.  Follow-up colonoscopy in 4 years Dyslipidemia.  HDL cholesterol much improved ED.  Will try daily Cialis for BPH symptom control

## 2014-05-10 MED ORDER — TESTOSTERONE 20.25 MG/1.25GM (1.62%) TD GEL: 4.0000 "application " | Freq: Once | TRANSDERMAL | Status: DC

## 2014-05-13 ENCOUNTER — Telehealth: Payer: Self-pay | Admitting: Internal Medicine

## 2014-05-13 NOTE — Telephone Encounter (Signed)
PA for Cialis was denied.  Patient's plan requires patient to try and fail the or cannot take the standard medications used to treat BPH such as Acodart, Proscar, Flomax, or Uroxatral.

## 2014-05-13 NOTE — Telephone Encounter (Signed)
Please see message and advise 

## 2014-06-14 NOTE — Telephone Encounter (Signed)
Spoke to pt, told him Cialis was denied and there is nothing we can do to have it covered. Told him can discuss with Dr. Kirtland BouchardK at next visit. Pt verbalized understanding.

## 2014-06-15 ENCOUNTER — Telehealth: Payer: Self-pay | Admitting: Internal Medicine

## 2014-06-15 MED ORDER — NAPROXEN 500 MG PO TBEC: 500.0000 mg | DELAYED_RELEASE_TABLET | Freq: Two times a day (BID) | ORAL | Status: DC

## 2014-06-15 MED ORDER — TAMSULOSIN HCL 0.4 MG PO CAPS: 0.4000 mg | ORAL_CAPSULE | Freq: Every day | ORAL | Status: DC

## 2014-06-15 MED ORDER — NIACIN ER (ANTIHYPERLIPIDEMIC) 1000 MG PO TBCR: 1000.0000 mg | EXTENDED_RELEASE_TABLET | Freq: Every day | ORAL | Status: DC

## 2014-06-15 MED ORDER — FLUOXETINE HCL 20 MG PO CAPS: ORAL_CAPSULE | ORAL | Status: DC

## 2014-06-15 MED ORDER — FLUTICASONE PROPIONATE 50 MCG/ACT NA SUSP: 2.0000 | Freq: Every day | NASAL | Status: DC

## 2014-06-15 NOTE — Telephone Encounter (Signed)
Pt notified Rx's sent to [facility] as requested. 

## 2014-06-15 NOTE — Telephone Encounter (Signed)
Pt needs refills on niaspan,prozac,flonase,naproxen and flomax 90 day supply with refills [facility]

## 2014-07-08 ENCOUNTER — Encounter: Payer: Self-pay | Admitting: Internal Medicine

## 2014-07-08 ENCOUNTER — Ambulatory Visit (INDEPENDENT_AMBULATORY_CARE_PROVIDER_SITE_OTHER): Payer: BLUE CROSS/BLUE SHIELD | Admitting: Internal Medicine

## 2014-07-08 VITALS — BP 130/76 | HR 104 | Temp 98.0°F | Resp 20 | Ht 67.25 in | Wt 192.0 lb

## 2014-07-08 DIAGNOSIS — G4700 Insomnia, unspecified: Secondary | ICD-10-CM | POA: Diagnosis not present

## 2014-07-08 DIAGNOSIS — F900 Attention-deficit hyperactivity disorder, predominantly inattentive type: Secondary | ICD-10-CM | POA: Diagnosis not present

## 2014-07-08 NOTE — Patient Instructions (Signed)
Limit your sodium (Salt) intake  Please check your blood pressure on a regular basis.  If it is consistently greater than 150/90, please make an office appointment.    It is important that you exercise regularly, at least 20 minutes 3 to 4 times per week.  If you develop chest pain or shortness of breath seek  medical attention.  Return in one year for follow-up  

## 2014-07-08 NOTE — Progress Notes (Signed)
   Subjective:    Patient ID: Harold CellarJames B Resor Jr., male    DOB: 1945-09-02, 69 y.o.   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  Apparently, blood pressure was elevated. Also, concerns were raised about treatment with Adderall.  Patient has self discontinued Adderall a number of months ago and feels that he has done well without treatment.  He wishes to continue off therapy.  He presents today with unused prescriptions for Adderall which were discarded. He also has a history of episodic insomnia and has been on occasional zolpidem since 2014.  He is also self discontinued this medication and has not taken for months.  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 There were also concerns raised about obstructive sleep apnea.  He presents documentation from [facility].  A sleep study was performed in 2008 that was completely negative  BP Readings from Last 3 Encounters:  07/08/14 130/76  05/09/14 138/90  04/22/13 160/90   Blood pressure today on arrival was 150 over 88.  This normalized to 130/76  Prior to his discharge   Review of Systems     Objective:   Physical Exam Blood pressure 130/76       Assessment & Plan:   #1.  History of ADHD.  Patient stable off medications.  Patient will longer takes Adderall and will be followed off therapy #2.  History of episodic insomnia.  Ambien discontinued.  Insomnia, stable off therapy #3.  History of labile hypertension.  Patient normotensive today off therapy #4.  OSA concerns- sleep study in 2008 negative for sleep apnea  Medication list updated.  Adderall and Ambien discontinued

## 2014-07-08 NOTE — Progress Notes (Signed)
Pre visit review using our clinic review tool, if applicable. No additional management support is needed unless otherwise documented below in the visit note. 

## 2014-11-11 ENCOUNTER — Other Ambulatory Visit: Payer: Self-pay | Admitting: Internal Medicine

## 2015-04-12 ENCOUNTER — Telehealth: Payer: Self-pay | Admitting: Internal Medicine

## 2015-04-12 NOTE — Telephone Encounter (Signed)
[facility] - Client TELEPHONE ADVICE RECORD [facility] Patient Name: Harold Wilson DOB: 07-13-1945 Initial Comment Caller states he has diarrhea and vomiting. Nurse Assessment Nurse: Izola Price, RN, Cala Bradford Date/Time (Eastern Time): 04/12/2015 3:03:39 PM Confirm and document reason for call. If symptomatic, describe symptoms. You must click the next button to save text entered. ---Caller states he has diarrhea and vomiting. pt wants zofran called in. Started last night. unable to take temp but "knows he has one." Has vomited 3 times and unable to keep anything down. Same amt of times with diarrhea. Has the patient traveled out of the country within the last 30 days? ---No Does the patient have any new or worsening symptoms? ---Yes Will a triage be completed? ---Yes Related visit to physician within the last 2 weeks? ---No Does the PT have any chronic conditions? (i.e. diabetes, asthma, etc.) ---No Is this a behavioral health or substance abuse call? ---No Guidelines Guideline Title Affirmed Question Affirmed Notes Vomiting MILD or MODERATE vomiting (e.g., 1 - 5 times / day) (all triage questions negative) Final Disposition User Home Care Izola Price, RN, Cala Bradford Comments takes naproxen, prozac and flomax normally but nothing today. [facility] called for medication (zofran) [phone]. Pt was notified that if he continues to vomit and/or pt doesnt urinate in a total of 9-10 hrs, pt needs to call in or go to ER. Pt stated understanding Disagree/Comply: Comply

## 2015-04-13 MED ORDER — ONDANSETRON HCL 8 MG PO TABS: ORAL_TABLET | ORAL | Status: DC

## 2015-04-13 NOTE — Telephone Encounter (Signed)
Spoke to pt, apologized for not calling back yesterday. Dr. Kirtland Bouchard and I were both out of the office. Asked pt how he is feeling this morning? Pt said a little better did vomit this morning, no diarrhea yet and has nausea. Pt said only one Zofran was sent in for him. Told pt I will discuss with Dr.K and get Rx for Zofran and send to pharmacy for you. Pt verbalized understanding. Asked pt if having fever? Pt stated  Yes. Told pt take Tylenol every 4-6 hours, drink plenty of fluids and eat a bland diet etc: toast, crackers, chicken soup, no milk products and get rest. Pt verbalized understanding.  Discussed pt status with Dr.K, verbal order given for Zofran 8 mg one tablet by mouth every 6-8 hours as needed for nausea, disp #20.  Left message on voicemail Rx for Zofran was sent to pharmacy.

## 2015-05-04 ENCOUNTER — Telehealth: Payer: Self-pay | Admitting: Internal Medicine

## 2015-05-04 MED ORDER — ZOLPIDEM TARTRATE 10 MG PO TABS: ORAL_TABLET | ORAL | Status: DC

## 2015-05-04 NOTE — Telephone Encounter (Signed)
Patient need refill of medication zolpidem (AMBIEN) 10 MG tablet. Send to  [id number] - [place], [place] - [address] [phone] (Phone) [phone] (Fax)

## 2015-05-04 NOTE — Telephone Encounter (Signed)
Rx called in to pharmacy. 

## 2015-05-10 ENCOUNTER — Telehealth: Payer: Self-pay | Admitting: Internal Medicine

## 2015-05-10 MED ORDER — ZOLPIDEM TARTRATE 10 MG PO TABS: ORAL_TABLET | ORAL | Status: DC

## 2015-05-10 MED ORDER — NAPROXEN 500 MG PO TBEC: 500.0000 mg | DELAYED_RELEASE_TABLET | Freq: Two times a day (BID) | ORAL | Status: DC

## 2015-05-10 MED ORDER — NIACIN ER (ANTIHYPERLIPIDEMIC) 1000 MG PO TBCR: 1000.0000 mg | EXTENDED_RELEASE_TABLET | Freq: Every day | ORAL | Status: DC

## 2015-05-10 MED ORDER — TAMSULOSIN HCL 0.4 MG PO CAPS: 0.4000 mg | ORAL_CAPSULE | Freq: Every day | ORAL | Status: DC

## 2015-05-10 MED ORDER — FLUOXETINE HCL 20 MG PO CAPS: ORAL_CAPSULE | ORAL | Status: DC

## 2015-05-10 NOTE — Telephone Encounter (Signed)
Spoke to pt, told him Rx's are ready for pickup, will be at the front desk. Pt verbalized understanding.

## 2015-05-10 NOTE — Telephone Encounter (Signed)
Pt needs new rxs to pick up  and send to new pharm. Pt needs fluoxetine 20 mg, tamsulosin 0.4 mg ,zolpidem 10 mg, naproxen 500 mg and niacin 100 mg #90 each w/refills. Pt has physical sch for June 2017

## 2015-06-13 ENCOUNTER — Telehealth: Payer: Self-pay | Admitting: General Practice

## 2015-06-13 NOTE — Telephone Encounter (Signed)
[facility] (key # [id number]). Could not send in we do not have the patients updated insurance card per fax he now has [employer]. Called pt and LMOVM to inform that these new cards need to be brought to the [facility] and be scanned in.

## 2015-07-07 ENCOUNTER — Ambulatory Visit (INDEPENDENT_AMBULATORY_CARE_PROVIDER_SITE_OTHER): Payer: PPO | Admitting: Internal Medicine

## 2015-07-07 ENCOUNTER — Encounter: Payer: Self-pay | Admitting: Internal Medicine

## 2015-07-07 VITALS — BP 150/80 | HR 93 | Temp 98.3°F | Resp 20 | Ht 67.25 in | Wt 188.0 lb

## 2015-07-07 DIAGNOSIS — J0100 Acute maxillary sinusitis, unspecified: Secondary | ICD-10-CM | POA: Diagnosis not present

## 2015-07-07 MED ORDER — AZITHROMYCIN 250 MG PO TABS: ORAL_TABLET | ORAL | Status: DC

## 2015-07-07 MED ORDER — SILDENAFIL CITRATE 20 MG PO TABS: ORAL_TABLET | ORAL | Status: DC

## 2015-07-07 NOTE — Progress Notes (Signed)
Pre visit review using our clinic review tool, if applicable. No additional management support is needed unless otherwise documented below in the visit note. 

## 2015-07-07 NOTE — Patient Instructions (Signed)
Use saline irrigation, warm  moist compresses and over-the-counter decongestants only as directed.  Call if there is no improvement in 5 to 7 days, or sooner if you develop increasing pain, fever, or any new symptoms.  Continue Flonase  Take your antibiotic as prescribed until ALL of it is gone, but stop if you develop a rash, swelling, or any side effects of the medication.  Contact our office as soon as possible if  there are side effects of the medication.  HOME CARE INSTRUCTIONS  Drink plenty of water. Water helps thin the mucus so your sinuses can drain more easily.  Use a humidifier.  Inhale steam 3-4 times a day (for example, sit in the bathroom with the shower running).  Apply a warm, moist washcloth to your face 3-4 times a day, or as directed by your health care provider.  Use saline nasal sprays to help moisten and clean your sinuses.  Take medicines only as directed by your health care provider.  If you were prescribed either an antibiotic or antifungal medicine, finish it all even if you start to feel better.

## 2015-07-07 NOTE — Progress Notes (Signed)
Subjective:    Patient ID: Harold Cellar., male    DOB: 05/12/1945, 70 y.o.   MRN: [id number]  HPI  70 year old patient who has a history of osteoarthritis and dyslipidemia.  He was stable until about 1 week ago he developed a sinus and chest congestion and scratchy sore throat.  For the past few days.  She is worsened with increasing sinus pain.  No drainage.  He has been expectorating green sputum.  He is developed a low-grade fever and general sense of worsening  Past Medical History  Diagnosis Date  . BENIGN PROSTATIC HYPERTROPHY 02/06/2007  . COLONIC POLYPS, HX OF 02/06/2007  . DIVERTICULOSIS, COLON 02/06/2007  . ERECTILE DYSFUNCTION, NON-ORGANIC, MILD 02/23/2009  . HYPERLIPIDEMIA 02/22/2008  . OSTEOARTHRITIS 02/06/2007     Social History   Social History  . Marital Status: Married    Spouse Name: N/A  . Number of Children: N/A  . Years of Education: N/A   Occupational History  . Not on file.   Social History Main Topics  . Smoking status: Never Smoker   . Smokeless tobacco: Never Used  . Alcohol Use: Yes  . Drug Use: No  . Sexual Activity: Not on file   Other Topics Concern  . Not on file   Social History Narrative    Past Surgical History  Procedure Laterality Date  . Vasectomy    . Rotator cuff repair    . Tonsillectomy    . Cervical spine surgery    . Temporomandibular joint surgery      Family History  Problem Relation Age of Onset  . COPD Mother   . Heart disease Mother   . Cancer Father     lung ca    Allergies  Allergen Reactions  . Nickel Rash  . Sulfacetamide Sodium     Dryness of mouth     Current Outpatient Prescriptions on File Prior to Visit  Medication Sig Dispense Refill  . FLUoxetine (PROZAC) 20 MG capsule TAKE 1 BY MOUTH DAILY 90 capsule 1  . fluticasone (FLONASE) 50 MCG/ACT nasal spray Place 2 sprays into both nostrils daily. 48 g 3  . naproxen (NAPROXEN DR) 500 MG EC tablet Take 1 tablet (500 mg total) by mouth 2 (two)  times daily with a meal. 180 tablet 3  . niacin (NIASPAN) 1000 MG CR tablet Take 1 tablet (1,000 mg total) by mouth at bedtime. 90 tablet 3  . tadalafil (CIALIS) 5 MG tablet 1 tablet daily.  For ED and BPH symptom control 30 tablet 6  . tamsulosin (FLOMAX) 0.4 MG CAPS capsule Take 1 capsule (0.4 mg total) by mouth daily. 90 capsule 1  . zolpidem (AMBIEN) 10 MG tablet take 1 tablet by mouth at bedtime if needed 30 tablet 5   No current facility-administered medications on file prior to visit.    BP 150/80 mmHg  Pulse 93  Temp(Src) 98.3 F (36.8 C) (Oral)  Resp 20  Ht 5' 7.25" (1.708 m)  Wt 188 lb (85.276 kg)  BMI 29.23 kg/m2  SpO2 97%     Review of Systems  Constitutional: Positive for fever, activity change, appetite change and fatigue. Negative for chills.  HENT: Positive for congestion, postnasal drip, rhinorrhea and sinus pressure. Negative for dental problem, ear pain, hearing loss, sore throat, tinnitus, trouble swallowing and voice change.   Eyes: Negative for pain, discharge and visual disturbance.  Respiratory: Positive for cough. Negative for chest tightness, wheezing and stridor.   Cardiovascular:  Negative for chest pain, palpitations and leg swelling.  Gastrointestinal: Negative for nausea, vomiting, abdominal pain, diarrhea, constipation, blood in stool and abdominal distention.  Genitourinary: Negative for urgency, hematuria, flank pain, discharge, difficulty urinating and genital sores.  Musculoskeletal: Negative for myalgias, back pain, joint swelling, arthralgias, gait problem and neck stiffness.  Skin: Negative for rash.  Neurological: Negative for dizziness, syncope, speech difficulty, weakness, numbness and headaches.  Hematological: Negative for adenopathy. Does not bruise/bleed easily.  Psychiatric/Behavioral: Negative for behavioral problems and dysphoric mood. The patient is not nervous/anxious.        Objective:   Physical Exam  Constitutional: He is  oriented to person, place, and time. He appears well-developed.  HENT:  Head: Normocephalic.  Right Ear: External ear normal.  Left Ear: External ear normal.   Sinus congestion Mild left maxillary sinus tenderness  Eyes: Conjunctivae and EOM are normal.  Neck: Normal range of motion.  Cardiovascular: Normal rate and normal heart sounds.   Pulmonary/Chest: Breath sounds normal.  Scattered rhonchi  Abdominal: Bowel sounds are normal.  Musculoskeletal: Normal range of motion. He exhibits no edema or tenderness.  Neurological: He is alert and oriented to person, place, and time.  Psychiatric: He has a normal mood and affect. His behavior is normal.          Assessment & Plan:   Sinusitis/bronchitis.  Will treat with the decongestants, expectorants and  azithromycin ED-  patient wishes to try a generic Viagra.  A new prescription dispensed

## 2015-07-14 ENCOUNTER — Telehealth: Payer: Self-pay | Admitting: Internal Medicine

## 2015-07-14 MED ORDER — DOXYCYCLINE HYCLATE 100 MG PO TABS: 100.0000 mg | ORAL_TABLET | Freq: Two times a day (BID) | ORAL | Status: DC

## 2015-07-14 NOTE — Telephone Encounter (Signed)
Please see message and advise 

## 2015-07-14 NOTE — Telephone Encounter (Signed)
Spoke to pt, told him Rx for Doxycycline 100 mg one twice a day x 7 days was sent to pharmacy. Also take over-the-counter expectorants and cough medications such as Mucinex DM. Call if there is no improvement in 5 to 7 days or if you develop worsening cough, fever, or new symptoms, such as shortness of breath or chest pain per Dr.K. Pt verbalized understanding.

## 2015-07-14 NOTE — Telephone Encounter (Signed)
Pt states he still has cough, and green mucus. Pt states the azithromycin (ZITHROMAX) 250 MG tablet did not clear him up.  Pt continues to cough about all night. Pt would like another med to help. [facility] [address]

## 2015-07-14 NOTE — Telephone Encounter (Signed)
Doxycycline 100 mg #14 one twice a day    Take over-the-counter expectorants and cough medications such as  Mucinex DM.  Call if there is no improvement in 5 to 7 days or if  you develop worsening cough, fever, or new symptoms, such as shortness of breath or chest pain.

## 2015-08-25 ENCOUNTER — Other Ambulatory Visit (INDEPENDENT_AMBULATORY_CARE_PROVIDER_SITE_OTHER): Payer: PPO

## 2015-08-25 DIAGNOSIS — Z0000 Encounter for general adult medical examination without abnormal findings: Secondary | ICD-10-CM | POA: Diagnosis not present

## 2015-08-25 LAB — LIPID PANEL
CHOL/HDL RATIO: 4
CHOLESTEROL: 153 mg/dL (ref 0–200)
HDL: 35.4 mg/dL — ABNORMAL LOW (ref >39.00)
LDL CALC: 104 mg/dL — AB (ref 0–99)
NonHDL: 117.3
Triglycerides: 68 mg/dL (ref 0.0–149.0)
VLDL: 13.6 mg/dL (ref 0.0–40.0)

## 2015-08-25 LAB — POC URINALSYSI DIPSTICK (AUTOMATED)
BILIRUBIN UA: NEGATIVE
Glucose, UA: NEGATIVE
KETONES UA: NEGATIVE
Leukocytes, UA: NEGATIVE
Nitrite, UA: NEGATIVE
PH UA: 6.5
Protein, UA: NEGATIVE
RBC UA: NEGATIVE
Spec Grav, UA: 1.02
Urobilinogen, UA: 0.2

## 2015-08-25 LAB — CBC WITH DIFFERENTIAL/PLATELET
Basophils Absolute: 0 10*3/uL (ref 0.0–0.1)
Basophils Relative: 0.4 % (ref 0.0–3.0)
EOS PCT: 2.5 % (ref 0.0–5.0)
Eosinophils Absolute: 0.1 10*3/uL (ref 0.0–0.7)
HEMATOCRIT: 40.5 % (ref 39.0–52.0)
HEMOGLOBIN: 13.7 g/dL (ref 13.0–17.0)
LYMPHS PCT: 24.7 % (ref 12.0–46.0)
Lymphs Abs: 1.2 10*3/uL (ref 0.7–4.0)
MCHC: 33.8 g/dL (ref 30.0–36.0)
MCV: 89.3 fl (ref 78.0–100.0)
MONO ABS: 0.3 10*3/uL (ref 0.1–1.0)
MONOS PCT: 7.2 % (ref 3.0–12.0)
Neutro Abs: 3.1 10*3/uL (ref 1.4–7.7)
Neutrophils Relative %: 65.2 % (ref 43.0–77.0)
Platelets: 133 10*3/uL — ABNORMAL LOW (ref 150.0–400.0)
RBC: 4.53 Mil/uL (ref 4.22–5.81)
RDW: 13.4 % (ref 11.5–15.5)
WBC: 4.8 10*3/uL (ref 4.0–10.5)

## 2015-08-25 LAB — BASIC METABOLIC PANEL
BUN: 16 mg/dL (ref 6–23)
CALCIUM: 9.4 mg/dL (ref 8.4–10.5)
CO2: 29 mEq/L (ref 19–32)
CREATININE: 1.12 mg/dL (ref 0.40–1.50)
Chloride: 108 mEq/L (ref 96–112)
GFR: 69.4 mL/min (ref >60.00)
Glucose, Bld: 87 mg/dL (ref 70–99)
Potassium: 4 mEq/L (ref 3.5–5.1)
Sodium: 143 mEq/L (ref 135–145)

## 2015-08-25 LAB — HEPATIC FUNCTION PANEL
ALT: 14 U/L (ref 0–53)
AST: 14 U/L (ref 0–37)
Albumin: 4.3 g/dL (ref 3.5–5.2)
Alkaline Phosphatase: 42 U/L (ref 39–117)
BILIRUBIN DIRECT: 0.2 mg/dL (ref 0.0–0.3)
BILIRUBIN TOTAL: 0.8 mg/dL (ref 0.2–1.2)
TOTAL PROTEIN: 6.2 g/dL (ref 6.0–8.3)

## 2015-08-25 LAB — PSA: PSA: 0.99 ng/mL (ref 0.10–4.00)

## 2015-08-25 LAB — TSH: TSH: 2.31 u[IU]/mL (ref 0.35–4.50)

## 2015-09-01 ENCOUNTER — Encounter: Payer: Self-pay | Admitting: Internal Medicine

## 2015-09-01 ENCOUNTER — Ambulatory Visit (INDEPENDENT_AMBULATORY_CARE_PROVIDER_SITE_OTHER): Payer: PPO | Admitting: Internal Medicine

## 2015-09-01 ENCOUNTER — Telehealth: Payer: Self-pay | Admitting: Internal Medicine

## 2015-09-01 VITALS — BP 148/80 | HR 96 | Temp 98.7°F | Ht 67.7 in | Wt 192.0 lb

## 2015-09-01 DIAGNOSIS — E785 Hyperlipidemia, unspecified: Secondary | ICD-10-CM | POA: Diagnosis not present

## 2015-09-01 DIAGNOSIS — Z0000 Encounter for general adult medical examination without abnormal findings: Secondary | ICD-10-CM | POA: Diagnosis not present

## 2015-09-01 DIAGNOSIS — Z86010 Personal history of colonic polyps: Secondary | ICD-10-CM

## 2015-09-01 DIAGNOSIS — M150 Primary generalized (osteo)arthritis: Secondary | ICD-10-CM

## 2015-09-01 DIAGNOSIS — M5431 Sciatica, right side: Secondary | ICD-10-CM | POA: Diagnosis not present

## 2015-09-01 DIAGNOSIS — M159 Polyosteoarthritis, unspecified: Secondary | ICD-10-CM

## 2015-09-01 MED ORDER — ZOLPIDEM TARTRATE 10 MG PO TABS: ORAL_TABLET | ORAL | Status: DC

## 2015-09-01 MED ORDER — METHYLPREDNISOLONE ACETATE 80 MG/ML IJ SUSP
80.0000 mg | Freq: Once | INTRAMUSCULAR | Status: AC
  Administered 2015-09-01: 80 mg via INTRAMUSCULAR

## 2015-09-01 NOTE — Progress Notes (Signed)
Subjective:    Patient ID: Harold CellarJames B Shanley Jr., male    DOB: Aug 13, 1945, 70 y.o.   MRN: [id number]  HPI   Subjective:    Patient ID: Harold Wilson, male    DOB: Aug 13, 1945, 70 y.o.   MRN: [id number]  HPI  70 year-old patient who is seen today for a wellness exam.   He is doing quite well; he does have a history of BPH and has nocturia times 3. He is also seeing Dr. Evelene CroonKaur. He has osteoarthritis history of colonic polyps;  he has mild dyslipidemia. He is otherwise doing well Last colonoscopy was performed June 2015. He has a history of ADHD and presently has been off Adderall   Review of Systems  Constitutional: Negative for fever, chills, activity change, appetite change and fatigue.  HENT: Negative for hearing loss, ear pain, congestion, rhinorrhea, sneezing, mouth sores, trouble swallowing, neck pain, neck stiffness, dental problem, voice change, sinus pressure and tinnitus.   Eyes: Negative for photophobia, pain, redness and visual disturbance.  Respiratory: Negative for apnea, cough, choking, chest tightness, shortness of breath and wheezing.   Cardiovascular: Negative for chest pain, palpitations and leg swelling.  Gastrointestinal: Negative for nausea, vomiting, abdominal pain, diarrhea, constipation, blood in stool, abdominal distention, anal bleeding and rectal pain.  Genitourinary: Negative for dysuria, urgency, frequency, hematuria, flank pain, decreased urine volume, discharge, penile swelling, scrotal swelling, difficulty urinating, genital sores and testicular pain.  Musculoskeletal: Positive for arthralgias (bilateral shoulder pain). Negative for myalgias, back pain, joint swelling and gait problem.  Skin: Negative for color change, rash and wound.  Neurological: Negative for dizziness, tremors, seizures, syncope, facial asymmetry, speech difficulty, weakness, light-headedness, numbness and headaches.  Hematological: Negative for adenopathy. Does not bruise/bleed easily.   Psychiatric/Behavioral: Negative for suicidal ideas, hallucinations, behavioral problems, confusion, sleep disturbance, self-injury, dysphoric mood, decreased concentration and agitation. The patient is not nervous/anxious.     BP Readings from Last 3 Encounters:  09/01/15 148/80  07/07/15 150/80  07/08/14 130/76   Past Medical History  Diagnosis Date  . BENIGN PROSTATIC HYPERTROPHY 02/06/2007  . COLONIC POLYPS, HX OF 02/06/2007  . DIVERTICULOSIS, COLON 02/06/2007  . ERECTILE DYSFUNCTION, NON-ORGANIC, MILD 02/23/2009  . HYPERLIPIDEMIA 02/22/2008  . OSTEOARTHRITIS 02/06/2007     Social History   Social History  . Marital Status: Married    Spouse Name: N/A  . Number of Children: N/A  . Years of Education: N/A   Occupational History  . Not on file.   Social History Main Topics  . Smoking status: Never Smoker   . Smokeless tobacco: Never Used  . Alcohol Use: Yes  . Drug Use: No  . Sexual Activity: Not on file   Other Topics Concern  . Not on file   Social History Narrative    Past Surgical History  Procedure Laterality Date  . Vasectomy    . Rotator cuff repair    . Tonsillectomy    . Cervical spine surgery    . Temporomandibular joint surgery      Family History  Problem Relation Age of Onset  . COPD Mother   . Heart disease Mother   . Cancer Father     lung ca    Allergies  Allergen Reactions  . Nickel Rash  . Sulfacetamide Sodium     Dryness of mouth     Current Outpatient Prescriptions on File Prior to Visit  Medication Sig Dispense Refill  . clindamycin (CLEOCIN) 150 MG capsule Reported on 07/07/2015 DENTAL  WORK  0  . doxycycline (VIBRA-TABS) 100 MG tablet Take 1 tablet (100 mg total) by mouth 2 (two) times daily. 14 tablet 0  . FLUoxetine (PROZAC) 20 MG capsule TAKE 1 BY MOUTH DAILY 90 capsule 1  . fluticasone (FLONASE) 50 MCG/ACT nasal spray Place 2 sprays into both nostrils daily. 48 g 3  . naproxen (NAPROXEN DR) 500 MG EC tablet Take 1 tablet  (500 mg total) by mouth 2 (two) times daily with a meal. 180 tablet 3  . niacin (NIASPAN) 1000 MG CR tablet Take 1 tablet (1,000 mg total) by mouth at bedtime. 90 tablet 3  . sildenafil (REVATIO) 20 MG tablet 2 tablets daily as directed 20 tablet 3  . tadalafil (CIALIS) 5 MG tablet 1 tablet daily.  For ED and BPH symptom control 30 tablet 6  . tamsulosin (FLOMAX) 0.4 MG CAPS capsule Take 1 capsule (0.4 mg total) by mouth daily. 90 capsule 1  . zolpidem (AMBIEN) 10 MG tablet take 1 tablet by mouth at bedtime if needed 30 tablet 5   No current facility-administered medications on file prior to visit.    BP 148/80 mmHg  Pulse 96  Temp(Src) 98.7 F (37.1 C) (Oral)  Ht 5' 7.7" (1.72 m)  Wt 192 lb (87.091 kg)  BMI 29.44 kg/m2  SpO2 98%   1. Risk factors, based on past  M,S,F history.  Current vascular risk factors unremarkable except for mild dyslipidemia  2.  Physical activities: No activity restrictions does have a history of osteoarthritis  3.  Depression/mood: Has a history of ADHD.  Has seen Dr. Evelene CroonKaur in the past  4.  Hearing: Moderately severe deficits  5.  ADL's: Independent  6.  Fall risk: Low  7.  Home safety: No problems identified  8.  Height weight, and visual acuity; height and weight stable no change in visual acuity  9.  Counseling: Heart healthy diet regular.  Exercise encouraged  10. Lab orders based on risk factors: Laboratory profile reviewed and unremarkable  11. Referral : Not appropriate at this time.  Annual eye examination encouraged  12. Care plan: We'll continue heart healthy diet.  Will continue colonoscopies at five-year intervals  13. Cognitive assessment:   14. Screening: Patient provided with a written and personalized 5-10 year screening schedule in the AVS.    15. Provider List Update:       Objective:   Physical Exam  Constitutional: He appears well-developed and well-nourished.  HENT:  Head: Normocephalic and atraumatic.  Right Ear:  External ear normal.  Left Ear: External ear normal.  Nose: Nose normal.  Mouth/Throat: Oropharynx is clear and moist.  Eyes: Conjunctivae and EOM are normal. Pupils are equal, round, and reactive to light. No scleral icterus.       Vision 20/20 with glasses left right and each eye  Neck: Normal range of motion. Neck supple. No JVD present. No thyromegaly present.  Cardiovascular: Regular rhythm, normal heart sounds and intact distal pulses.  Exam reveals no gallop and no friction rub.   No murmur heard. Pulmonary/Chest: Effort normal and breath sounds normal. He exhibits no tenderness.  Abdominal: Soft. Bowel sounds are normal. He exhibits no distension and no mass. There is no tenderness.  Genitourinary: Prostate normal and penis normal. Guaiac negative stool.       +2 enlarged symmetrical  Musculoskeletal: Normal range of motion. He exhibits no edema and no tenderness.  Lymphadenopathy:    He has no cervical adenopathy.  Neurological: He  is alert. He has normal reflexes. No cranial nerve deficit. Coordination normal.  Skin: Skin is warm and dry. No rash noted.  Psychiatric: He has a normal mood and affect. His behavior is normal.          Assessment & Plan:  Preventive health examination Bilateral shoulder pain. Followup orthopedics ADHD prescription for Adderall dispensed. Followup psychiatry BPH stable Osteoarthritis  Recheck one year or as needed DOT form completed   Review of Systems  Musculoskeletal:       Right hip discomfort with radiation down the right lateral leg       Objective:   Physical Exam  HENT:  Hearing aids in place  Musculoskeletal:  Mildly positive straight leg test on the right   blood pressure 140/90 Diminished right dorsalis pedis pulse       Assessment & Plan:  Preventive health care Low-salt diet recommended.  Home blood pressure monitoring.  Encouraged. Osteoarthritis ADHD -Adderall refilled.   History of colonic polyps.   Follow-up colonoscopy Every 5 years Dyslipidemia.  HDL cholesterol much improved ED.

## 2015-09-01 NOTE — Patient Instructions (Signed)
Limit your sodium (Salt) intake    It is important that you exercise regularly, at least 20 minutes 3 to 4 times per week.  If you develop chest pain or shortness of breath seek  medical attention.  Back Exercises The following exercises strengthen the muscles that help to support the back. They also help to keep the lower back flexible. Doing these exercises can help to prevent back pain or lessen existing pain. If you have back pain or discomfort, try doing these exercises 2-3 times each day or as told by your health care provider. When the pain goes away, do them once each day, but increase the number of times that you repeat the steps for each exercise (do more repetitions). If you do not have back pain or discomfort, do these exercises once each day or as told by your health care provider. EXERCISES Single Knee to Chest Repeat these steps 3-5 times for each leg: 1. Lie on your back on a firm bed or the floor with your legs extended. 2. Bring one knee to your chest. Your other leg should stay extended and in contact with the floor. 3. Hold your knee in place by grabbing your knee or thigh. 4. Pull on your knee until you feel a gentle stretch in your lower back. 5. Hold the stretch for 10-30 seconds. 6. Slowly release and straighten your leg. Pelvic Tilt Repeat these steps 5-10 times: 1. Lie on your back on a firm bed or the floor with your legs extended. 2. Bend your knees so they are pointing toward the ceiling and your feet are flat on the floor. 3. Tighten your lower abdominal muscles to press your lower back against the floor. This motion will tilt your pelvis so your tailbone points up toward the ceiling instead of pointing to your feet or the floor. 4. With gentle tension and even breathing, hold this position for 5-10 seconds. Cat-Cow Repeat these steps until your lower back becomes more flexible: 1. Get into a hands-and-knees position on a firm surface. Keep your hands under your  shoulders, and keep your knees under your hips. You may place padding under your knees for comfort. 2. Let your head hang down, and point your tailbone toward the floor so your lower back becomes rounded like the back of a cat. 3. Hold this position for 5 seconds. 4. Slowly lift your head and point your tailbone up toward the ceiling so your back forms a sagging arch like the back of a cow. 5. Hold this position for 5 seconds. Press-Ups Repeat these steps 5-10 times: 1. Lie on your abdomen (face-down) on the floor. 2. Place your palms near your head, about shoulder-width apart. 3. While you keep your back as relaxed as possible and keep your hips on the floor, slowly straighten your arms to raise the top half of your body and lift your shoulders. Do not use your back muscles to raise your upper torso. You may adjust the placement of your hands to make yourself more comfortable. 4. Hold this position for 5 seconds while you keep your back relaxed. 5. Slowly return to lying flat on the floor. Bridges Repeat these steps 10 times: 1. Lie on your back on a firm surface. 2. Bend your knees so they are pointing toward the ceiling and your feet are flat on the floor. 3. Tighten your buttocks muscles and lift your buttocks off of the floor until your waist is at almost the same height as  your knees. You should feel the muscles working in your buttocks and the back of your thighs. If you do not feel these muscles, slide your feet 1-2 inches farther away from your buttocks. 4. Hold this position for 3-5 seconds. 5. Slowly lower your hips to the starting position, and allow your buttocks muscles to relax completely. If this exercise is too easy, try doing it with your arms crossed over your chest. Abdominal Crunches Repeat these steps 5-10 times: 1. Lie on your back on a firm bed or the floor with your legs extended. 2. Bend your knees so they are pointing toward the ceiling and your feet are flat on the  floor. 3. Cross your arms over your chest. 4. Tip your chin slightly toward your chest without bending your neck. 5. Tighten your abdominal muscles and slowly raise your trunk (torso) high enough to lift your shoulder blades a tiny bit off of the floor. Avoid raising your torso higher than that, because it can put too much stress on your low back and it does not help to strengthen your abdominal muscles. 6. Slowly return to your starting position. Back Lifts Repeat these steps 5-10 times: 1. Lie on your abdomen (face-down) with your arms at your sides, and rest your forehead on the floor. 2. Tighten the muscles in your legs and your buttocks. 3. Slowly lift your chest off of the floor while you keep your hips pressed to the floor. Keep the back of your head in line with the curve in your back. Your eyes should be looking at the floor. 4. Hold this position for 3-5 seconds. 5. Slowly return to your starting position. SEEK MEDICAL CARE IF:  Your back pain or discomfort gets much worse when you do an exercise.  Your back pain or discomfort does not lessen within 2 hours after you exercise. If you have any of these problems, stop doing these exercises right away. Do not do them again unless your health care provider says that you can. SEEK IMMEDIATE MEDICAL CARE IF:  You develop sudden, severe back pain. If this happens, stop doing the exercises right away. Do not do them again unless your health care provider says that you can.   This information is not intended to replace advice given to you by your health care provider. Make sure you discuss any questions you have with your health care provider.   Document Released: 03/28/2004 Document Revised: 11/09/2014 Document Reviewed: 04/14/2014 Elsevier Interactive Patient Education [employer]2016 Elsevier Inc.

## 2015-09-01 NOTE — Telephone Encounter (Signed)
Pt need Rx Flexeril that was talked about during the visit today.     Pharm:  [facility].

## 2015-09-01 NOTE — Progress Notes (Signed)
Pre visit review using our clinic review tool, if applicable. No additional management support is needed unless otherwise documented below in the visit note. 

## 2015-09-01 NOTE — Telephone Encounter (Signed)
Generic Flexeril 5 mg #30 one 3 times a day

## 2015-09-06 MED ORDER — CYCLOBENZAPRINE HCL 5 MG PO TABS: 5.0000 mg | ORAL_TABLET | Freq: Three times a day (TID) | ORAL | Status: DC | PRN

## 2015-09-06 NOTE — Telephone Encounter (Signed)
Spoke to pt, told him Rx was sent to pharmacy. Pt verbalized understanding. 

## 2015-09-19 ENCOUNTER — Other Ambulatory Visit: Payer: Self-pay | Admitting: *Deleted

## 2015-09-19 MED ORDER — TAMSULOSIN HCL 0.4 MG PO CAPS: 0.4000 mg | ORAL_CAPSULE | Freq: Every day | ORAL | Status: DC

## 2015-09-19 MED ORDER — SILDENAFIL CITRATE 20 MG PO TABS: ORAL_TABLET | ORAL | Status: DC

## 2015-09-19 MED ORDER — CYCLOBENZAPRINE HCL 5 MG PO TABS: 5.0000 mg | ORAL_TABLET | Freq: Three times a day (TID) | ORAL | Status: DC | PRN

## 2015-09-19 MED ORDER — FLUOXETINE HCL 20 MG PO CAPS: ORAL_CAPSULE | ORAL | Status: DC

## 2015-09-19 MED ORDER — FLUCONAZOLE 150 MG PO TABS: 150.0000 mg | ORAL_TABLET | Freq: Once | ORAL | Status: DC

## 2015-09-19 MED ORDER — NAPROXEN 500 MG PO TBEC: 500.0000 mg | DELAYED_RELEASE_TABLET | Freq: Two times a day (BID) | ORAL | Status: DC

## 2015-09-19 MED ORDER — NIACIN ER (ANTIHYPERLIPIDEMIC) 1000 MG PO TBCR: 1000.0000 mg | EXTENDED_RELEASE_TABLET | Freq: Every day | ORAL | Status: DC

## 2015-09-19 NOTE — Telephone Encounter (Signed)
Flexeril, Niacin, Prozac, Flomax and Naproxen printed out and faxed to [facility]. [facility]an 150 [facility]. Patient informed.

## 2015-10-03 ENCOUNTER — Telehealth: Payer: Self-pay | Admitting: Internal Medicine

## 2015-10-03 NOTE — Telephone Encounter (Signed)
Pt states his pharmacy needs new rx for 1.  sildenafil (REVATIO) 20 MG tablet  We sent 90 tabs, pt takes BID. 90 day supply (180)  2.  cyclobenzaprine (FLEXERIL) 5 MG tablet  instructions state TID Pt needs a 90 day supply (270) tabs  [facility] Fax [phone]

## 2015-10-05 MED ORDER — CYCLOBENZAPRINE HCL 5 MG PO TABS: 5.0000 mg | ORAL_TABLET | Freq: Three times a day (TID) | ORAL | 0 refills | Status: DC | PRN

## 2015-10-05 MED ORDER — SILDENAFIL CITRATE 20 MG PO TABS: ORAL_TABLET | ORAL | 0 refills | Status: DC

## 2015-10-05 NOTE — Telephone Encounter (Signed)
Rxs sent

## 2015-10-09 ENCOUNTER — Telehealth: Payer: Self-pay

## 2015-10-09 NOTE — Telephone Encounter (Signed)
Prior Authorization filed for cyclobenzaprine (FLEXERIL) 5 MG tablet  Key: [id number]

## 2015-10-11 ENCOUNTER — Other Ambulatory Visit: Payer: Self-pay | Admitting: Internal Medicine

## 2015-10-11 NOTE — Telephone Encounter (Addendum)
[facility] called to request addisonian information. Is the pt taking this med daily, or intermittently  May be approved if taking intermittently. Will not approve for daily use in pts over 65 over.   Harold Wilson will resend a fax with this info on it.

## 2015-10-12 ENCOUNTER — Telehealth: Payer: Self-pay

## 2015-10-12 NOTE — Telephone Encounter (Signed)
Prior Authorization for cyclobenzaprine (FLEXERIL) 5 MG tablet denial received  The requested drug must be prescribed for a medically accepted indication such NG:EXBMWas:Acute musculoskeletal conditions or Fibromyalgia. Your request was denied because it is being used for: Chronic Musculoskeletal Conditions.

## 2015-10-12 NOTE — Telephone Encounter (Signed)
Spoke with [employer]. Medication denied due to indication

## 2015-10-18 ENCOUNTER — Other Ambulatory Visit: Payer: Self-pay | Admitting: Internal Medicine

## 2015-11-14 DIAGNOSIS — H40013 Open angle with borderline findings, low risk, bilateral: Secondary | ICD-10-CM | POA: Diagnosis not present

## 2015-11-14 DIAGNOSIS — H2513 Age-related nuclear cataract, bilateral: Secondary | ICD-10-CM | POA: Diagnosis not present

## 2015-11-17 ENCOUNTER — Telehealth: Payer: Self-pay | Admitting: Internal Medicine

## 2015-11-17 MED ORDER — CYCLOBENZAPRINE HCL 5 MG PO TABS: ORAL_TABLET | ORAL | 2 refills | Status: DC

## 2015-11-17 NOTE — Telephone Encounter (Signed)
Pt would like to have a paper script for FLEXERIL #90  Pt would like a call when ready.

## 2015-11-17 NOTE — Telephone Encounter (Signed)
Spoke to pt, asked pt if he ever got mail order Rx for Flexeril? Pt said no, his insurance would not cover it so he never got it. Pt said he goes on [facility] and can get Rx cheap but needs paper Rx. Told him okay will get Rx ready and put at front desk. Pt verbalized understanding. Rx printed and signed by Dr.K.

## 2015-11-17 NOTE — Telephone Encounter (Signed)
Pt would like to have a paper script for Rx Flexril 10 mg for #90.  Pt would like to have a call to see when ready

## 2015-11-17 NOTE — Telephone Encounter (Signed)
Duplicate message. 

## 2015-11-24 ENCOUNTER — Other Ambulatory Visit: Payer: Self-pay | Admitting: Internal Medicine

## 2015-11-28 ENCOUNTER — Telehealth: Payer: Self-pay | Admitting: Internal Medicine

## 2015-11-28 MED ORDER — ZOLPIDEM TARTRATE 10 MG PO TABS: ORAL_TABLET | ORAL | 1 refills | Status: DC

## 2015-11-28 MED ORDER — ZOLPIDEM TARTRATE 10 MG PO TABS: ORAL_TABLET | ORAL | 0 refills | Status: DC

## 2015-11-28 NOTE — Telephone Encounter (Signed)
Spoke to pt, told him sorry I missed it in his chart and he is up to date. I called the pharmacy and changed Rx to 90 day supply with refill. Pt verbalized understanding.

## 2015-11-28 NOTE — Telephone Encounter (Signed)
Spoke to pt, told him refill was called into pharmacy but only for 30 days needs to make an appt. Told pt overdue for physical. Pt verbalized understanding.

## 2015-11-28 NOTE — Telephone Encounter (Signed)
Pt just had his cpe on 09/01/15.  Would like to know if you will please change his rx to a 90 with refills. Pt usually only has to come 1 x year. Thanks!

## 2015-11-28 NOTE — Telephone Encounter (Signed)
° ° ° °  Pt request refill of the following:   zolpidem (AMBIEN) 10 MG tablet   Phamacy:  [facility]

## 2015-11-28 NOTE — Addendum Note (Signed)
Addended by: Jimmye NormanPHANOS, Takima Encina J on: 11/28/2015 03:56 PM   Modules accepted: Orders

## 2016-03-11 ENCOUNTER — Other Ambulatory Visit: Payer: Self-pay | Admitting: Internal Medicine

## 2016-03-15 ENCOUNTER — Ambulatory Visit: Payer: PPO

## 2016-03-15 ENCOUNTER — Telehealth: Payer: Self-pay

## 2016-03-15 ENCOUNTER — Other Ambulatory Visit: Payer: Self-pay | Admitting: Internal Medicine

## 2016-03-15 MED ORDER — CYCLOBENZAPRINE HCL 5 MG PO TABS: ORAL_TABLET | ORAL | 0 refills | Status: DC

## 2016-03-15 MED ORDER — SILDENAFIL CITRATE 20 MG PO TABS: ORAL_TABLET | ORAL | 0 refills | Status: DC

## 2016-03-15 NOTE — Telephone Encounter (Signed)
Call to Mr. Duffy RhodyStanley as he was scheduled for annual wellness but it is not due until June. Will schedule AWV  In June when he makes his apt with Dr. Kirtland BouchardK  Requested refill  1. Sildenafil 20mg  2 tablets as directed 2. Cyclobenzaprine 5mg ; tid as needed for muscle spasms  Please advise

## 2016-03-15 NOTE — Telephone Encounter (Signed)
Medications were refilled.  

## 2016-03-28 ENCOUNTER — Telehealth: Payer: Self-pay

## 2016-03-28 NOTE — Telephone Encounter (Signed)
[facility]. PA submitted & is pending. Key: [id number]

## 2016-04-01 MED ORDER — SILDENAFIL CITRATE 20 MG PO TABS: ORAL_TABLET | ORAL | 0 refills | Status: DC

## 2016-04-01 NOTE — Addendum Note (Signed)
Addended by: Neville RouteJAIMES, Sayvon Arterberry G on: 04/01/2016 03:22 PM   Modules accepted: Orders

## 2016-04-01 NOTE — Telephone Encounter (Signed)
Pt notified Rx ready for pickup. Rx printed and signed.  

## 2016-04-01 NOTE — Telephone Encounter (Signed)
Notify patient that apparently  ED drugs are not covered by his plan which is almost always the case

## 2016-04-01 NOTE — Telephone Encounter (Signed)
PA denied. Erectile dysfunction drugs will meet the definition of a Part D drug when prescribed for medically-accepted indications approved by the FDA other than sexual or erectile dysfunction ( such as pulmonary hypertension or benign prostatic hypertrophy.)

## 2016-04-01 NOTE — Telephone Encounter (Signed)
See message below, please advise.

## 2016-04-15 ENCOUNTER — Telehealth: Payer: Self-pay | Admitting: Internal Medicine

## 2016-04-15 NOTE — Telephone Encounter (Signed)
[facility].

## 2016-04-15 NOTE — Telephone Encounter (Signed)
° ° °  Pt said he went out of town and they lost his luggage with his medication in it. Called today to ask for a early refill.     zolpidem (AMBIEN) 10 MG tablet   Pharmacy [facility] [address]

## 2016-05-23 ENCOUNTER — Other Ambulatory Visit: Payer: Self-pay | Admitting: Internal Medicine

## 2016-05-23 MED ORDER — ZOLPIDEM TARTRATE 10 MG PO TABS: ORAL_TABLET | ORAL | 0 refills | Status: DC

## 2016-07-10 ENCOUNTER — Ambulatory Visit (INDEPENDENT_AMBULATORY_CARE_PROVIDER_SITE_OTHER): Payer: Self-pay | Admitting: Urgent Care

## 2016-07-10 ENCOUNTER — Encounter: Payer: Self-pay | Admitting: Urgent Care

## 2016-07-10 VITALS — BP 140/82 | Temp 98.0°F | Resp 18 | Ht 67.7 in | Wt 194.2 lb

## 2016-07-10 DIAGNOSIS — N529 Male erectile dysfunction, unspecified: Secondary | ICD-10-CM

## 2016-07-10 DIAGNOSIS — Z9889 Other specified postprocedural states: Secondary | ICD-10-CM

## 2016-07-10 DIAGNOSIS — Z024 Encounter for examination for driving license: Secondary | ICD-10-CM

## 2016-07-10 DIAGNOSIS — N400 Enlarged prostate without lower urinary tract symptoms: Secondary | ICD-10-CM

## 2016-07-10 DIAGNOSIS — F419 Anxiety disorder, unspecified: Secondary | ICD-10-CM

## 2016-07-10 DIAGNOSIS — E785 Hyperlipidemia, unspecified: Secondary | ICD-10-CM

## 2016-07-10 NOTE — Progress Notes (Signed)
  Commercial Driver Medical Examination   Harold CellarJames B Wisser Jr. is a 71 y.o. male who presents today for a DOT physical exam. The patient reports low HDL cholesterol, managed with niacin. Also has anxiety managed with Prozac. Denies using zolpidem since getting this script several months ago, denies insomnia. He has been tested for sleep apnea, last f/u was in 2016, presents a letter stating that he definitively does not have sleep apnea. Has a history of smoking 1ppd, quit smoking 40 years ago. Has had shoulder surgery, cervical surgery and is without sequelae. Denies alcohol use. Denies dizziness, chronic headache, blurred vision, chest pain, shortness of breath, heart racing, palpitations, nausea, vomiting, abdominal pain, hematuria, lower leg swelling.   The following portions of the patient's history were reviewed and updated as appropriate: allergies, current medications, past family history, past medical history, past social history and past surgical history.  Objective:   BP 140/82   Temp 98 F (36.7 C) (Oral)   Resp 18   Ht 5' 7.7" (1.72 m)   Wt 194 lb 3.2 oz (88.1 kg)   SpO2 98%   BMI 29.79 kg/m   Vision/hearing:  Visual Acuity Screening   Right eye Left eye Both eyes  Without correction:     With correction: 20/20 20/20 20/20   Hearing Screening Comments: horizontal PASSED- 85 DEGREES Whisper test done passed- 2810feet  Patient can recognize and distinguish among traffic control signals and devices showing standard red, green, and amber colors.  Corrective lenses required: Yes  Monocular Vision?: No  Hearing aid requirement: No  Physical Exam  Constitutional: He is oriented to person, place, and time. He appears well-developed and well-nourished.  HENT:  TM's intact bilaterally, no effusions or erythema. Nasal turbinates pink and moist, nasal passages patent. No sinus tenderness. Oropharynx clear, mucous membranes moist.  Eyes: Conjunctivae and EOM are normal. Pupils  are equal, round, and reactive to light. Right eye exhibits no discharge. Left eye exhibits no discharge. No scleral icterus.  Neck: Normal range of motion. Neck supple.  Cardiovascular: Normal rate, regular rhythm and intact distal pulses.  Exam reveals no gallop and no friction rub.   No murmur heard. Pulmonary/Chest: No stridor. No respiratory distress. He has no wheezes. He has no rales.  Abdominal: Soft. Bowel sounds are normal. He exhibits no distension and no mass. There is no tenderness.  Musculoskeletal: Normal range of motion. He exhibits no edema or tenderness.  Lymphadenopathy:    He has no cervical adenopathy.  Neurological: He is alert and oriented to person, place, and time. He has normal reflexes. He displays normal reflexes. Coordination normal.  Skin: Skin is warm and dry. No rash noted. No erythema. No pallor.  Multiple excoriations over dorsal aspect of hands. Fixed, compressible mass over superior scalp at midline.  Psychiatric: He has a normal mood and affect.   Labs: Comments: Urinalysis-  SP. GR 1.010     NEG- PROTEIN   NEG- BLOOD  NEG- SUGER  Assessment:    Healthy male exam.  Meets standards for 2 year certificate.   Plan:   Medical examiners certificate completed and printed. Return as needed.  Harold BambergMario Mohmmad Saleeby, PA-C [facility] at [facility] [phone] 07/10/2016  11:45 AM

## 2016-07-10 NOTE — Patient Instructions (Signed)
     IF you received an x-ray today, you will receive an invoice from [facility]. Please contact [facility] at [phone] with questions or concerns regarding your invoice.   IF you received labwork today, you will receive an invoice from [facility]. Please contact [facility] at [phone] with questions or concerns regarding your invoice.   Our billing staff will not be able to assist you with questions regarding bills from these companies.  You will be contacted with the lab results as soon as they are available. The fastest way to get your results is to activate your My Chart account. Instructions are located on the last page of this paperwork. If you have not heard from us regarding the results in 2 weeks, please contact this office.     

## 2016-08-07 ENCOUNTER — Other Ambulatory Visit: Payer: Self-pay | Admitting: Internal Medicine

## 2016-08-07 ENCOUNTER — Telehealth: Payer: Self-pay | Admitting: Internal Medicine

## 2016-08-07 MED ORDER — ZOLPIDEM TARTRATE 10 MG PO TABS: ORAL_TABLET | ORAL | 0 refills | Status: DC

## 2016-08-07 NOTE — Telephone Encounter (Signed)
° ° ° °  Pt request refill of the following:  zolpidem (AMBIEN) 10 MG tablet   [facility] [place]

## 2016-08-07 NOTE — Telephone Encounter (Signed)
Medication was refilled.

## 2016-08-15 ENCOUNTER — Other Ambulatory Visit: Payer: Self-pay | Admitting: Internal Medicine

## 2016-08-18 ENCOUNTER — Other Ambulatory Visit: Payer: Self-pay | Admitting: Internal Medicine

## 2016-09-05 ENCOUNTER — Ambulatory Visit: Payer: PPO

## 2016-09-05 ENCOUNTER — Other Ambulatory Visit (INDEPENDENT_AMBULATORY_CARE_PROVIDER_SITE_OTHER): Payer: PPO

## 2016-09-05 DIAGNOSIS — E785 Hyperlipidemia, unspecified: Secondary | ICD-10-CM

## 2016-09-05 LAB — CBC WITH DIFFERENTIAL/PLATELET
Basophils Absolute: 0 10*3/uL (ref 0.0–0.1)
Basophils Relative: 0.7 % (ref 0.0–3.0)
Eosinophils Absolute: 0.2 10*3/uL (ref 0.0–0.7)
Eosinophils Relative: 3.4 % (ref 0.0–5.0)
HCT: 39.1 % (ref 39.0–52.0)
Hemoglobin: 13.5 g/dL (ref 13.0–17.0)
LYMPHS PCT: 25.5 % (ref 12.0–46.0)
Lymphs Abs: 1.2 10*3/uL (ref 0.7–4.0)
MCHC: 34.5 g/dL (ref 30.0–36.0)
MCV: 87.6 fl (ref 78.0–100.0)
Monocytes Absolute: 0.3 10*3/uL (ref 0.1–1.0)
Monocytes Relative: 6.9 % (ref 3.0–12.0)
Neutro Abs: 3 10*3/uL (ref 1.4–7.7)
Neutrophils Relative %: 63.5 % (ref 43.0–77.0)
Platelets: 173 10*3/uL (ref 150.0–400.0)
RBC: 4.47 Mil/uL (ref 4.22–5.81)
RDW: 13.4 % (ref 11.5–15.5)
WBC: 4.7 10*3/uL (ref 4.0–10.5)

## 2016-09-05 LAB — HEPATIC FUNCTION PANEL
ALT: 12 U/L (ref 0–53)
AST: 12 U/L (ref 0–37)
Albumin: 4.1 g/dL (ref 3.5–5.2)
Alkaline Phosphatase: 49 U/L (ref 39–117)
BILIRUBIN TOTAL: 0.6 mg/dL (ref 0.2–1.2)
Bilirubin, Direct: 0.2 mg/dL (ref 0.0–0.3)
Total Protein: 6.2 g/dL (ref 6.0–8.3)

## 2016-09-05 LAB — BASIC METABOLIC PANEL
BUN: 14 mg/dL (ref 6–23)
CALCIUM: 8.5 mg/dL (ref 8.4–10.5)
CO2: 27 meq/L (ref 19–32)
Chloride: 108 mEq/L (ref 96–112)
Creatinine, Ser: 1.26 mg/dL (ref 0.40–1.50)
GFR: 60.4 mL/min (ref >60.00)
Glucose, Bld: 91 mg/dL (ref 70–99)
Potassium: 3.6 mEq/L (ref 3.5–5.1)
Sodium: 143 mEq/L (ref 135–145)

## 2016-09-05 LAB — POC URINALSYSI DIPSTICK (AUTOMATED)
Bilirubin, UA: NEGATIVE
Glucose, UA: NEGATIVE
KETONES UA: NEGATIVE
Leukocytes, UA: NEGATIVE
Nitrite, UA: NEGATIVE
PH UA: 6 (ref 5.0–8.0)
PROTEIN UA: NEGATIVE
RBC UA: NEGATIVE
SPEC GRAV UA: 1.015 (ref 1.010–1.025)
Urobilinogen, UA: 0.2 E.U./dL

## 2016-09-05 LAB — LIPID PANEL
Cholesterol: 147 mg/dL (ref 0–200)
HDL: 37.9 mg/dL — ABNORMAL LOW (ref >39.00)
LDL CALC: 98 mg/dL (ref 0–99)
NONHDL: 109.12
Total CHOL/HDL Ratio: 4
Triglycerides: 57 mg/dL (ref 0.0–149.0)
VLDL: 11.4 mg/dL (ref 0.0–40.0)

## 2016-09-09 ENCOUNTER — Encounter: Payer: Self-pay | Admitting: Internal Medicine

## 2016-09-09 ENCOUNTER — Ambulatory Visit (INDEPENDENT_AMBULATORY_CARE_PROVIDER_SITE_OTHER): Payer: PPO | Admitting: Internal Medicine

## 2016-09-09 VITALS — BP 152/88 | HR 86 | Temp 97.8°F | Ht 67.0 in | Wt 194.2 lb

## 2016-09-09 DIAGNOSIS — Z0001 Encounter for general adult medical examination with abnormal findings: Secondary | ICD-10-CM | POA: Diagnosis not present

## 2016-09-09 DIAGNOSIS — Z0000 Encounter for general adult medical examination without abnormal findings: Secondary | ICD-10-CM

## 2016-09-09 DIAGNOSIS — M25561 Pain in right knee: Secondary | ICD-10-CM | POA: Diagnosis not present

## 2016-09-09 MED ORDER — FLUTICASONE PROPIONATE 50 MCG/ACT NA SUSP: 2.0000 | Freq: Every day | NASAL | 3 refills | Status: DC

## 2016-09-09 MED ORDER — NIACIN ER (ANTIHYPERLIPIDEMIC) 1000 MG PO TBCR: 1000.0000 mg | EXTENDED_RELEASE_TABLET | Freq: Every day | ORAL | 3 refills | Status: DC

## 2016-09-09 MED ORDER — CYCLOBENZAPRINE HCL 5 MG PO TABS: 5.0000 mg | ORAL_TABLET | Freq: Three times a day (TID) | ORAL | 1 refills | Status: DC

## 2016-09-09 MED ORDER — TAMSULOSIN HCL 0.4 MG PO CAPS: 0.4000 mg | ORAL_CAPSULE | Freq: Every day | ORAL | 3 refills | Status: DC

## 2016-09-09 MED ORDER — NAPROXEN 500 MG PO TBEC: 500.0000 mg | DELAYED_RELEASE_TABLET | Freq: Two times a day (BID) | ORAL | 3 refills | Status: DC

## 2016-09-09 MED ORDER — TADALAFIL 5 MG PO TABS: ORAL_TABLET | ORAL | 6 refills | Status: DC

## 2016-09-09 MED ORDER — ZOLPIDEM TARTRATE 10 MG PO TABS: ORAL_TABLET | ORAL | 0 refills | Status: DC

## 2016-09-09 MED ORDER — FLUOXETINE HCL 20 MG PO CAPS: ORAL_CAPSULE | ORAL | 3 refills | Status: DC

## 2016-09-09 NOTE — Patient Instructions (Signed)
Orthopedic evaluation as discussed  Limit your sodium (Salt) intake  Please check your blood pressure on a regular basis.  If it is consistently greater than 150/90, please make an office appointment.    It is important that you exercise regularly, at least 20 minutes 3 to 4 times per week.  If you develop chest pain or shortness of breath seek  medical attention.

## 2016-09-09 NOTE — Progress Notes (Signed)
Subjective:    Patient ID: Harold CellarJames B Oxendine Jr., male    DOB: [birth year], 71 y.o.   MRN: [id number]  HPI  71 year old patient who is seen today for a wellness exam and subsequent Medicare wellness visit He has a history of mild dyslipidemia which is treated with niacin.  He has osteoarthritis. His only complaint today is a three-week history of right knee pain, swelling and popping.  No history of trauma  Last colonoscopy 2015  Past Medical History:  Diagnosis Date  . BENIGN PROSTATIC HYPERTROPHY 02/06/2007  . COLONIC POLYPS, HX OF 02/06/2007  . DIVERTICULOSIS, COLON 02/06/2007  . ERECTILE DYSFUNCTION, NON-ORGANIC, MILD 02/23/2009  . HYPERLIPIDEMIA 02/22/2008  . OSTEOARTHRITIS 02/06/2007     Social History   Social History  . Marital status: Married    Spouse name: N/A  . Number of children: N/A  . Years of education: N/A   Occupational History  . Not on file.   Social History Main Topics  . Smoking status: Never Smoker  . Smokeless tobacco: Never Used  . Alcohol use 0.6 oz/week    1 Cans of beer per week  . Drug use: No  . Sexual activity: Not on file   Other Topics Concern  . Not on file   Social History Narrative  . No narrative on file    Past Surgical History:  Procedure Laterality Date  . CERVICAL SPINE SURGERY    . ROTATOR CUFF REPAIR    . TEMPOROMANDIBULAR JOINT SURGERY    . TONSILLECTOMY    . VASECTOMY      Family History  Problem Relation Age of Onset  . COPD Mother   . Heart disease Mother   . Cancer Father        lung ca    Allergies  Allergen Reactions  . Nickel Rash  . Sulfacetamide Sodium     Dryness of mouth     Current Outpatient Prescriptions on File Prior to Visit  Medication Sig Dispense Refill  . sildenafil (REVATIO) 20 MG tablet 2 tablets daily as directed 180 tablet 0   No current facility-administered medications on file prior to visit.     BP (!) 152/88 (BP Location: Left Arm, Patient Position: Sitting, Cuff Size:  Normal)   Pulse 86   Temp 97.8 F (36.6 C) (Oral)   Ht 5\' 7"  (1.702 m)   Wt 194 lb 3.2 oz (88.1 kg)   SpO2 99%   BMI 30.42 kg/m    Medicare wellness visit   1. Risk factors, based on past  M,S,F history.  Current vascular risk factors include a history of mild dyslipidemia  2.  Physical activities: walks daily but no vigorous exercise regimen  3.  Depression/mood:no history of major depression or mood disorder  4.  Hearing:no deficits  5.  ADL's:independent  6.  Fall risk:low  7.  Home safety:no problems identified  8.  Height weight, and visual acuity;height and weight stable no change in visual acuity  9.  Counseling:continue heart healthy diet.  More vigorous exercise.  Encouraged  10. Lab orders based on risk factors:laboratory profile including lipid panel reviewed  11. Referral :orthopedic referral due to right knee pain and swelling  12. Care plan:continue efforts at aggressive risk factor modification.  No advanced directives in place.  This was encouraged  13. Cognitive assessment: alert in order with normal affect no cognitive dysfunction  14. Screening: Patient provided with a written and personalized 5-10 year screening schedule in the  AVS.    15. Provider List Update: primary care orthopedics and GI    Review of Systems  Constitutional: Negative for activity change, appetite change, chills, fatigue and fever.  HENT: Negative for congestion, dental problem, ear pain, hearing loss, mouth sores, rhinorrhea, sinus pressure, sneezing, tinnitus, trouble swallowing and voice change.   Eyes: Negative for photophobia, pain, redness and visual disturbance.  Respiratory: Negative for apnea, cough, choking, chest tightness, shortness of breath and wheezing.   Cardiovascular: Negative for chest pain, palpitations and leg swelling.  Gastrointestinal: Negative for abdominal distention, abdominal pain, anal bleeding, blood in stool, constipation, diarrhea, nausea,  rectal pain and vomiting.  Genitourinary: Negative for decreased urine volume, difficulty urinating, discharge, dysuria, flank pain, frequency, genital sores, hematuria, penile swelling, scrotal swelling, testicular pain and urgency.  Musculoskeletal: Positive for gait problem and joint swelling. Negative for arthralgias, back pain, myalgias, neck pain and neck stiffness.  Skin: Negative for color change, rash and wound.  Neurological: Negative for dizziness, tremors, seizures, syncope, facial asymmetry, speech difficulty, weakness, light-headedness, numbness and headaches.  Hematological: Negative for adenopathy. Does not bruise/bleed easily.  Psychiatric/Behavioral: Negative for agitation, behavioral problems, confusion, decreased concentration, dysphoric mood, hallucinations, self-injury, sleep disturbance and suicidal ideas. The patient is not nervous/anxious.        Objective:   Physical Exam  Constitutional: He appears well-developed and well-nourished.  HENT:  Head: Normocephalic and atraumatic.  Right Ear: External ear normal.  Left Ear: External ear normal.  Nose: Nose normal.  Mouth/Throat: Oropharynx is clear and moist.  Eyes: Conjunctivae and EOM are normal. Pupils are equal, round, and reactive to light. No scleral icterus.  Neck: Normal range of motion. Neck supple. No JVD present. No thyromegaly present.  Cardiovascular: Regular rhythm, normal heart sounds and intact distal pulses.  Exam reveals no gallop and no friction rub.   No murmur heard. Pulmonary/Chest: Effort normal and breath sounds normal. He exhibits no tenderness.  Abdominal: Soft. Bowel sounds are normal. He exhibits no distension and no mass. There is no tenderness.  Genitourinary: Penis normal. Rectal exam shows guaiac negative stool.  Genitourinary Comments: Mild BPH.  Right lobe more prominent than the left  Musculoskeletal: Normal range of motion. He exhibits no edema or tenderness.  Mild right knee  effusion warm to touch Popping noted with flexion and extension of the knee  Lymphadenopathy:    He has no cervical adenopathy.  Neurological: He is alert. He has normal reflexes. No cranial nerve deficit. Coordination normal.  Skin: Skin is warm and dry. No rash noted.  Psychiatric: He has a normal mood and affect. His behavior is normal.          Assessment & Plan:   Medicare wellness visit Right knee pain and swelling.  Probable degenerated meniscal tear.  Will set up for orthopedic evaluation ED sildenafil.  Refilled Mild BPH.  Continue Flomax  Medications updated Laboratory studies reviewed Home blood pressure monitoring recommended.  Repeat blood pressure as low as 1:30 over 84  Return in 6-12 months for follow-up  Rogelia Boga

## 2016-09-11 ENCOUNTER — Telehealth: Payer: Self-pay | Admitting: Internal Medicine

## 2016-09-11 NOTE — Telephone Encounter (Signed)
Okay, #60 Notify patient that muscle relaxers are for short-term use only Muscle relaxers are unlikely to help his knee pain

## 2016-09-11 NOTE — Telephone Encounter (Signed)
Ok to refill medication for a 90 day supply? Please advise

## 2016-09-11 NOTE — Telephone Encounter (Signed)
° ° °  [facility] mail order call to ask if they can give pt a 90 day supply of the below med do to insurance   cyclobenzaprine (FLEXERIL) 5 MG tablet   [phone]  Phone number you can call

## 2016-09-12 MED ORDER — CYCLOBENZAPRINE HCL 5 MG PO TABS: 5.0000 mg | ORAL_TABLET | Freq: Three times a day (TID) | ORAL | 0 refills | Status: DC

## 2016-09-12 NOTE — Telephone Encounter (Signed)
Rx was printed for #60 tablets. Will send to pharmacy tomorrow after MD signed Rx. Notified patient that muscle relaxers are for short-term use only and muscle relaxers are unlikely to help his knee pain. Pt verbalized understanding.

## 2016-09-13 ENCOUNTER — Ambulatory Visit (INDEPENDENT_AMBULATORY_CARE_PROVIDER_SITE_OTHER): Payer: PPO | Admitting: Orthopaedic Surgery

## 2016-09-13 ENCOUNTER — Ambulatory Visit (INDEPENDENT_AMBULATORY_CARE_PROVIDER_SITE_OTHER): Payer: PPO

## 2016-09-13 DIAGNOSIS — M25561 Pain in right knee: Secondary | ICD-10-CM | POA: Diagnosis not present

## 2016-09-13 NOTE — Progress Notes (Signed)
Office Visit Note   Patient: Harold Wilson.           Date of Birth: 06/12/1945           MRN: [id number] Visit Date: 09/13/2016              Requested by: Gordy Savers, MD [address] [place], [place] [address] PCP: Gordy Savers, MD   Assessment & Plan: Visit Diagnoses:  1. Acute pain of right knee     Plan: Overall impression is osteoarthritis flareup versus degenerative medial meniscal tear. Cortisone injection was performed today. Recommend NSAIDs as needed. Questions encouraged and answered. Follow-up as needed.  Follow-Up Instructions: Return if symptoms worsen or fail to improve.   Orders:  Orders Placed This Encounter  Procedures  . XR KNEE 3 VIEW RIGHT   No orders of the defined types were placed in this encounter.     Procedures: Large Joint Inj Date/Time: 09/13/2016 9:40 AM Performed by: Tarry Kos Authorized by: Tarry Kos   Consent Given by:  Patient Timeout: prior to procedure the correct patient, procedure, and site was verified   Indications:  Pain Location:  Knee Site:  R knee Prep: patient was prepped and draped in usual sterile fashion   Needle Size:  22 G Ultrasound Guidance: No   Fluoroscopic Guidance: No   Arthrogram: No   Patient tolerance:  Patient tolerated the procedure well with no immediate complications     Clinical Data: No additional findings.   Subjective: Chief Complaint  Patient presents with  . Right Knee - Pain    Patient is a very pleasant 71 year old gentleman comes in with 3 week history of right knee pain without injuries. He states that originally he has pain with tenderness on the medial side and felt like a burning pain. He was endorsing patellar crepitus. He denies any history of gout. He is not taking any medicines. He does state that it was originally swollen. It was popping constant. He denies any numbness or tingling or radiation of pain.    Review of Systems    Constitutional: Negative.   All other systems reviewed and are negative.    Objective: Vital Signs: There were no vitals taken for this visit.  Physical Exam  Constitutional: He is oriented to person, place, and time. He appears well-developed and well-nourished.  HENT:  Head: Normocephalic and atraumatic.  Eyes: Pupils are equal, round, and reactive to light.  Neck: Neck supple.  Pulmonary/Chest: Effort normal.  Abdominal: Soft.  Musculoskeletal: Normal range of motion.  Neurological: He is alert and oriented to person, place, and time.  Skin: Skin is warm.  Psychiatric: He has a normal mood and affect. His behavior is normal. Judgment and thought content normal.  Nursing note and vitals reviewed.   Ortho Exam Right knee exam shows a trace effusion. He does have medial joint line tenderness. Collaterals and cruciates are stable. Positive patellar crepitus.  Specialty Comments:  No specialty comments available.  Imaging: Xr Knee 3 View Right  Result Date: 09/13/2016 No acute structural findings. Mild joint space narrowing    [name] History: Patient Active Problem List   Diagnosis Date Noted  . Insomnia 06/02/2012  . ADHD (attention deficit hyperactivity disorder) 11/11/2011  . ERECTILE DYSFUNCTION, NON-ORGANIC, MILD 02/23/2009  . Dyslipidemia 02/22/2008  . DIVERTICULOSIS, COLON 02/06/2007  . BENIGN PROSTATIC HYPERTROPHY 02/06/2007  . Osteoarthritis 02/06/2007  . History of colonic polyps 02/06/2007   Past Medical  History:  Diagnosis Date  . BENIGN PROSTATIC HYPERTROPHY 02/06/2007  . COLONIC POLYPS, HX OF 02/06/2007  . DIVERTICULOSIS, COLON 02/06/2007  . ERECTILE DYSFUNCTION, NON-ORGANIC, MILD 02/23/2009  . HYPERLIPIDEMIA 02/22/2008  . OSTEOARTHRITIS 02/06/2007    Family History  Problem Relation Age of Onset  . COPD Mother   . Heart disease Mother   . Cancer Father        lung ca    Past Surgical History:  Procedure Laterality Date  . CERVICAL SPINE  SURGERY    . ROTATOR CUFF REPAIR    . TEMPOROMANDIBULAR JOINT SURGERY    . TONSILLECTOMY    . VASECTOMY     Social History   Occupational History  . Not on file.   Social History Main Topics  . Smoking status: Never Smoker  . Smokeless tobacco: Never Used  . Alcohol use 0.6 oz/week    1 Cans of beer per week  . Drug use: No  . Sexual activity: Not on file

## 2016-09-13 NOTE — Telephone Encounter (Signed)
Medication was sent to [facility] [address].

## 2016-10-25 ENCOUNTER — Other Ambulatory Visit: Payer: Self-pay | Admitting: Internal Medicine

## 2016-12-08 ENCOUNTER — Other Ambulatory Visit: Payer: Self-pay | Admitting: Internal Medicine

## 2017-01-27 ENCOUNTER — Other Ambulatory Visit: Payer: Self-pay | Admitting: Internal Medicine

## 2017-01-28 DIAGNOSIS — H52223 Regular astigmatism, bilateral: Secondary | ICD-10-CM | POA: Diagnosis not present

## 2017-01-28 DIAGNOSIS — H524 Presbyopia: Secondary | ICD-10-CM | POA: Diagnosis not present

## 2017-01-28 DIAGNOSIS — H40013 Open angle with borderline findings, low risk, bilateral: Secondary | ICD-10-CM | POA: Diagnosis not present

## 2017-01-28 DIAGNOSIS — H2513 Age-related nuclear cataract, bilateral: Secondary | ICD-10-CM | POA: Diagnosis not present

## 2017-02-02 ENCOUNTER — Other Ambulatory Visit: Payer: Self-pay | Admitting: Internal Medicine

## 2017-03-07 ENCOUNTER — Other Ambulatory Visit: Payer: Self-pay | Admitting: Internal Medicine

## 2017-03-12 ENCOUNTER — Other Ambulatory Visit: Payer: Self-pay | Admitting: Internal Medicine

## 2017-03-13 NOTE — Telephone Encounter (Signed)
Okay #60 refill x1

## 2017-05-02 ENCOUNTER — Other Ambulatory Visit: Payer: Self-pay | Admitting: Internal Medicine

## 2017-05-07 ENCOUNTER — Other Ambulatory Visit: Payer: Self-pay | Admitting: Internal Medicine

## 2017-05-07 NOTE — Telephone Encounter (Signed)
Copied from CRM [id number]. Topic: Quick Communication - Rx Refill/Question >> May 07, 2017 12:27 PM Stephannie LiSimmons, Ether Wolters L, NT wrote: Medication   zolpidem (AMBIEN) 10 MG tablet Has the patient contacted their pharmacy? yes (Agent: If no, request that the patient contact the pharmacy for the refill. Preferred Pharmacy (with phone number or street name): [facility] [address]  [phone] FAX  [phone] Agent: Please be advised that RX refills may take up to 3 business days. We ask that you follow-up with your pharmacy.

## 2017-05-08 NOTE — Telephone Encounter (Signed)
LOV 09/18/16 Dr. Etter SjogrenKwiatkowski [facility] [address]

## 2017-05-09 MED ORDER — ZOLPIDEM TARTRATE 10 MG PO TABS: ORAL_TABLET | ORAL | 0 refills | Status: DC

## 2017-05-09 NOTE — Telephone Encounter (Signed)
Rx called in 

## 2017-06-02 ENCOUNTER — Ambulatory Visit (INDEPENDENT_AMBULATORY_CARE_PROVIDER_SITE_OTHER)
Admission: RE | Admit: 2017-06-02 | Discharge: 2017-06-02 | Disposition: A | Discharge status: Home / Self Care | Payer: PPO | Source: Ambulatory Visit | Attending: Family Medicine | Admitting: Family Medicine

## 2017-06-02 ENCOUNTER — Encounter: Payer: Self-pay | Admitting: Family Medicine

## 2017-06-02 ENCOUNTER — Ambulatory Visit (INDEPENDENT_AMBULATORY_CARE_PROVIDER_SITE_OTHER): Payer: PPO | Admitting: Family Medicine

## 2017-06-02 VITALS — BP 148/72 | HR 72 | Temp 98.2°F | Wt 196.0 lb

## 2017-06-02 DIAGNOSIS — R197 Diarrhea, unspecified: Secondary | ICD-10-CM | POA: Diagnosis not present

## 2017-06-02 DIAGNOSIS — R1084 Generalized abdominal pain: Secondary | ICD-10-CM | POA: Diagnosis not present

## 2017-06-02 DIAGNOSIS — R140 Abdominal distension (gaseous): Secondary | ICD-10-CM

## 2017-06-02 NOTE — Progress Notes (Signed)
Subjective:    Patient ID: Harold CellarJames B Westrup Jr., male    DOB: [birth year], 72 y.o.   MRN: [id number]  Chief Complaint  Patient presents with  . Bloated    HPI Patient was seen today for acute concern.  Pt endorses bloating, flatus, belching, right arm pain, abdominal pain x 3 days.  Pt states symptoms started on Friday with diarrhea.  Pt has taken Nexium and Tums for his symptoms with no relief.  Pt endorses having a BM yesterday that was formed.  Pt denies fever, chills, nausea, vomiting, sick contacts.  Pt is afraid to eat.  Pt had a few "nabs" this morning.  Patient denies history of lactose intolerance, changes in diet, urinary retention, or abdominal surgeries.  Pt does have a h/o diverticulosis.  Past Medical History:  Diagnosis Date  . BENIGN PROSTATIC HYPERTROPHY 02/06/2007  . COLONIC POLYPS, HX OF 02/06/2007  . DIVERTICULOSIS, COLON 02/06/2007  . ERECTILE DYSFUNCTION, NON-ORGANIC, MILD 02/23/2009  . HYPERLIPIDEMIA 02/22/2008  . OSTEOARTHRITIS 02/06/2007    Allergies  Allergen Reactions  . Nickel Rash  . Sulfacetamide Sodium     Dryness of mouth     ROS General: Denies fever, chills, night sweats, changes in weight, changes in appetite HEENT: Denies headaches, ear pain, changes in vision, rhinorrhea, sore throat CV: Denies CP, palpitations, SOB, orthopnea Pulm: Denies SOB, cough, wheezing GI: Denies nausea, vomiting, diarrhea, constipation  + abdominal pain, belching, flatus GU: Denies dysuria, hematuria, frequency, vaginal discharge Msk: Denies muscle cramps, joint pains  +R arm pain Neuro: Denies weakness, numbness, tingling Skin: Denies rashes, bruising Psych: Denies depression, anxiety, hallucinations     Objective:    Blood pressure (!) 148/72, pulse 72, temperature 98.2 F (36.8 C), temperature source Oral, weight 196 lb (88.9 kg), SpO2 96 %.   Gen. Pleasant, well-nourished, in no distress, normal affect   HEENT: [place]/AT, face symmetric,no scleral icterus,  PERRLA, nares patent without drainage Lungs: no accessory muscle use, CTAB Cardiovascular: RRR, no peripheral edema Abdomen: Intermittent belching, BS high pitched gurgling, soft, tender in upper quadrants>Lower quadrants, mild destention, deep palpation difficult. Neuro:  A&Ox3, CN II-XII intact, normal gait   Wt Readings from Last 3 Encounters:  06/02/17 196 lb (88.9 kg)  09/09/16 194 lb 3.2 oz (88.1 kg)  07/10/16 194 lb 3.2 oz (88.1 kg)    Lab Results  Component Value Date   WBC 4.7 09/05/2016   HGB 13.5 09/05/2016   HCT 39.1 09/05/2016   PLT 173.0 09/05/2016   GLUCOSE 91 09/05/2016   CHOL 147 09/05/2016   TRIG 57.0 09/05/2016   HDL 37.90 (L) 09/05/2016   LDLCALC 98 09/05/2016   ALT 12 09/05/2016   AST 12 09/05/2016   NA 143 09/05/2016   K 3.6 09/05/2016   CL 108 09/05/2016   CREATININE 1.26 09/05/2016   BUN 14 09/05/2016   CO2 27 09/05/2016   TSH 2.31 08/25/2015   PSA 0.99 08/25/2015    Assessment/Plan:  Generalized abdominal pain  -Discussed increased gas such as reflux, small bowel obstruction, infectious causes patient denies history of lactose intolerance - Plan: DG Abd 2 Views  Abdominal bloating -Consider probiotics -Obtain imaging given concern for partial small bowel obstruction  - Plan: DG Abd 2 Views  Follow-up with PCP as needed  Abbe AmsterdamShannon Banks, MD

## 2017-06-02 NOTE — Patient Instructions (Signed)
Abdominal Bloating °When you have abdominal bloating, your abdomen may feel full, tight, or painful. It may also look bigger than normal or swollen (distended). Common causes of abdominal bloating include: °· Swallowing air. °· Constipation. °· Problems digesting food. °· Eating too much. °· Irritable bowel syndrome. This is a condition that affects the large intestine. °· Lactose intolerance. This is an inability to digest lactose, a natural sugar in dairy products. °· Celiac disease. This is a condition that affects the ability to digest gluten, a protein found in some grains. °· Gastroparesis. This is a condition that slows down the movement of food in the stomach and small intestine. It is more common in people with diabetes mellitus. °· Gastroesophageal reflux disease (GERD). This is a digestive condition that makes stomach acid flow back into the esophagus. °· Urinary retention. This means that the body is holding onto urine, and the bladder cannot be emptied all the way. ° °Follow these instructions at home: °Eating and drinking °· Avoid eating too much. °· Try not to swallow air while talking or eating. °· Avoid eating while lying down. °· Avoid these foods and drinks: °? Foods that cause gas, such as broccoli, cabbage, cauliflower, and baked beans. °? Carbonated drinks. °? Hard candy. °? Chewing gum. °Medicines °· Take over-the-counter and prescription medicines only as told by your health care provider. °· Take probiotic medicines. These medicines contain live bacteria or yeasts that can help digestion. °· Take coated peppermint oil capsules. °Activity °· Try to exercise regularly. Exercise may help to relieve bloating that is caused by gas and relieve constipation. °General instructions °· Keep all follow-up visits as told by your health care provider. This is important. °Contact a health care provider if: °· You have nausea and vomiting. °· You have diarrhea. °· You have abdominal pain. °· You have  unusual weight loss or weight gain. °· You have severe pain, and medicines do not help. °Get help right away if: °· You have severe chest pain. °· You have trouble breathing. °· You have shortness of breath. °· You have trouble urinating. °· You have darker urine than normal. °· You have blood in your stools or have dark, tarry stools. °Summary °· Abdominal bloating means that the abdomen is swollen. °· Common causes of abdominal bloating are swallowing air, constipation, and problems digesting food. °· Avoid eating too much and avoid swallowing air. °· Avoid foods that cause gas, carbonated drinks, hard candy, and chewing gum. °This information is not intended to replace advice given to you by your health care provider. Make sure you discuss any questions you have with your health care provider. °Document Released: 03/22/2016 Document Revised: 03/22/2016 Document Reviewed: 03/22/2016 °Elsevier Interactive Patient Education © 2018 [employer] Inc[employer] ° °

## 2017-06-04 ENCOUNTER — Other Ambulatory Visit: Payer: Self-pay | Admitting: Internal Medicine

## 2017-06-06 ENCOUNTER — Other Ambulatory Visit: Payer: Self-pay | Admitting: Internal Medicine

## 2017-07-02 ENCOUNTER — Telehealth: Payer: Self-pay | Admitting: Internal Medicine

## 2017-07-02 NOTE — Telephone Encounter (Addendum)
Patient calling back, states he was just seen on 06/02/17 w/ Dr banks. Please advise Call back [phone]

## 2017-07-03 NOTE — Telephone Encounter (Signed)
Okay to fill? Please advise 

## 2017-07-07 NOTE — Telephone Encounter (Signed)
Spoke to patient and informed him that he would have to wait until his appointment for a refill. Patient verbalized understanding.

## 2017-07-07 NOTE — Telephone Encounter (Signed)
Pt made appt for a med refill on 07/09/17 at 1:30. He would like to know if he can get a refill to least him until then.

## 2017-07-09 ENCOUNTER — Ambulatory Visit: Payer: PPO | Admitting: Internal Medicine

## 2017-07-10 ENCOUNTER — Other Ambulatory Visit: Payer: Self-pay

## 2017-07-10 ENCOUNTER — Encounter: Payer: Self-pay | Admitting: Internal Medicine

## 2017-07-10 ENCOUNTER — Ambulatory Visit (INDEPENDENT_AMBULATORY_CARE_PROVIDER_SITE_OTHER): Payer: PPO | Admitting: Internal Medicine

## 2017-07-10 VITALS — BP 140/88 | HR 89 | Temp 97.7°F | Wt 198.0 lb

## 2017-07-10 DIAGNOSIS — F5101 Primary insomnia: Secondary | ICD-10-CM

## 2017-07-10 DIAGNOSIS — M150 Primary generalized (osteo)arthritis: Secondary | ICD-10-CM

## 2017-07-10 DIAGNOSIS — M159 Polyosteoarthritis, unspecified: Secondary | ICD-10-CM

## 2017-07-10 MED ORDER — ZOLPIDEM TARTRATE 10 MG PO TABS: ORAL_TABLET | ORAL | 0 refills | Status: DC

## 2017-07-10 NOTE — Patient Instructions (Signed)

## 2017-07-10 NOTE — Progress Notes (Signed)
Subjective:    Patient ID: Harold Wilson., male    DOB: 04/24/1945, 72 y.o.   MRN: [id number]  HPI  72 year old patient who is scheduled for a physical later this summer.  Office visit scheduled prior to refill of Ambien which he has taken sporadically for some time.    He states he does not take the medication nightly but does use it frequently. It seems that the excess worry and anxiety are factors with his poor sleep  Sleep hygiene issues discussed and patient information supplied  Past Medical History:  Diagnosis Date  . BENIGN PROSTATIC HYPERTROPHY 02/06/2007  . COLONIC POLYPS, HX OF 02/06/2007  . DIVERTICULOSIS, COLON 02/06/2007  . ERECTILE DYSFUNCTION, NON-ORGANIC, MILD 02/23/2009  . HYPERLIPIDEMIA 02/22/2008  . OSTEOARTHRITIS 02/06/2007     Social History   Socioeconomic History  . Marital status: Married    Spouse name: Not on file  . Number of children: Not on file  . Years of education: Not on file  . Highest education level: Not on file  Occupational History  . Not on file  Social Needs  . Financial resource strain: Not on file  . Food insecurity:    Worry: Not on file    Inability: Not on file  . Transportation needs:    Medical: Not on file    Non-medical: Not on file  Tobacco Use  . Smoking status: Never Smoker  . Smokeless tobacco: Never Used  Substance and Sexual Activity  . Alcohol use: Yes    Alcohol/week: 0.6 oz    Types: 1 Cans of beer per week  . Drug use: No  . Sexual activity: Not on file  Lifestyle  . Physical activity:    Days per week: Not on file    Minutes per session: Not on file  . Stress: Not on file  Relationships  . Social connections:    Talks on phone: Not on file    Gets together: Not on file    Attends religious service: Not on file    Active member of club or organization: Not on file    Attends meetings of clubs or organizations: Not on file    Relationship status: Not on file  . Intimate partner violence:   Fear of current or ex partner: Not on file    Emotionally abused: Not on file    Physically abused: Not on file    Forced sexual activity: Not on file  Other Topics Concern  . Not on file  Social History Narrative  . Not on file    Past Surgical History:  Procedure Laterality Date  . CERVICAL SPINE SURGERY    . ROTATOR CUFF REPAIR    . TEMPOROMANDIBULAR JOINT SURGERY    . TONSILLECTOMY    . VASECTOMY      Family History  Problem Relation Age of Onset  . COPD Mother   . Heart disease Mother   . Cancer Father        lung ca    Allergies  Allergen Reactions  . Nickel Rash  . Sulfacetamide Sodium     Dryness of mouth     Current Outpatient Medications on File Prior to Visit  Medication Sig Dispense Refill  . FLUoxetine (PROZAC) 20 MG capsule TAKE 1 BY MOUTH DAILY 90 capsule 3  . fluticasone (FLONASE) 50 MCG/ACT nasal spray Place 2 sprays into both nostrils daily. 48 g 3  . naproxen (NAPROXEN DR) 500 MG EC tablet Take  1 tablet (500 mg total) by mouth 2 (two) times daily with a meal. 180 tablet 3  . niacin (NIASPAN) 1000 MG CR tablet Take 1 tablet (1,000 mg total) by mouth at bedtime. 90 tablet 3  . sildenafil (REVATIO) 20 MG tablet TAKE TWO TABLETS BY MOUTH DAILY AS DIRECTED 150 tablet 0  . tamsulosin (FLOMAX) 0.4 MG CAPS capsule Take 1 capsule (0.4 mg total) by mouth daily. 90 capsule 3   No current facility-administered medications on file prior to visit.     BP 140/88 (BP Location: Right Arm, Patient Position: Sitting, Cuff Size: Large)   Pulse 89   Temp 97.7 F (36.5 C) (Oral)   Wt 198 lb (89.8 kg)   SpO2 99%   BMI 31.01 kg/m     Review of Systems  Constitutional: Negative for appetite change, chills, fatigue and fever.  HENT: Negative for congestion, dental problem, ear pain, hearing loss, sore throat, tinnitus, trouble swallowing and voice change.   Eyes: Negative for pain, discharge and visual disturbance.  Respiratory: Negative for cough, chest  tightness, wheezing and stridor.   Cardiovascular: Negative for chest pain, palpitations and leg swelling.  Gastrointestinal: Negative for abdominal distention, abdominal pain, blood in stool, constipation, diarrhea, nausea and vomiting.  Genitourinary: Negative for difficulty urinating, discharge, flank pain, genital sores, hematuria and urgency.  Musculoskeletal: Negative for arthralgias, back pain, gait problem, joint swelling, myalgias and neck stiffness.  Skin: Negative for rash.  Neurological: Negative for dizziness, syncope, speech difficulty, weakness, numbness and headaches.  Hematological: Negative for adenopathy. Does not bruise/bleed easily.  Psychiatric/Behavioral: Positive for sleep disturbance. Negative for behavioral problems and dysphoric mood. The patient is nervous/anxious.        Objective:   Physical Exam  Constitutional: He is oriented to person, place, and time. He appears well-developed.  Blood pressure 136/86  HENT:  Head: Normocephalic.  Right Ear: External ear normal.  Left Ear: External ear normal.  Eyes: Conjunctivae and EOM are normal.  Neck: Normal range of motion.  Cardiovascular: Normal rate and normal heart sounds.  Pulmonary/Chest: Breath sounds normal.  Abdominal: Soft. There is no tenderness.  Musculoskeletal: He exhibits no edema or tenderness.  Neurological: He is alert and oriented to person, place, and time.  Skin: Pallor: .me.  Psychiatric: He has a normal mood and affect. His behavior is normal.  Vitals reviewed.         Assessment & Plan:  Insomnia.  Sleep hygiene issues discussed and patient information supplied.  It was recommended that he read the book,   "Why We Sleep"  CPX as scheduled   Rogelia Boga

## 2017-07-16 ENCOUNTER — Telehealth: Payer: Self-pay | Admitting: Internal Medicine

## 2017-07-16 NOTE — Telephone Encounter (Unsigned)
Copied from CRM [id number]. Topic: Quick Communication - See Telephone Encounter >> Jul 16, 2017 10:05 AM Floria Raveling A wrote: CRM for notification. See Telephone encounter for: 07/16/17. Pt called in and said that he has been coughing, little fever, sinus issues.  He would like to know if DR K would call him something in without coming in because he said he was just in office last week???    [facility] [address]

## 2017-07-16 NOTE — Telephone Encounter (Signed)
Please advise 

## 2017-07-16 NOTE — Telephone Encounter (Signed)
Patient notified and verbalized understanding. 

## 2017-07-16 NOTE — Telephone Encounter (Signed)
Acute bronchitis symptoms for less than 10 days are generally not helped by antibiotics.  Take over-the-counter expectorants and cough medications such as  Mucinex DM.  Call if there is no improvement in 5 to 7 days or if  you develop worsening cough, fever, or new symptoms, such as shortness of breath or chest pain.   

## 2017-07-31 ENCOUNTER — Other Ambulatory Visit: Payer: Self-pay | Admitting: Internal Medicine

## 2017-08-01 ENCOUNTER — Telehealth: Payer: Self-pay | Admitting: Internal Medicine

## 2017-08-01 NOTE — Telephone Encounter (Signed)
Copied from CRM [id number]. Topic: Quick Communication - Rx Refill/Question >> Aug 01, 2017 11:27 AM Harold Wilson wrote: Medication: cyclobenzaprine (FLEXERIL) 5 MG tablet [[id number]]  DISCONTINUED   Pt called to see why the medication above was discontinued, call pt to advise

## 2017-08-04 NOTE — Telephone Encounter (Signed)
Pt calling to see why Flexeril was discontinued.  LOV: 07/10/17 with Dr. Amador CunasKwiatkowski. Medication appears to be discontinued on the same day.

## 2017-08-05 MED ORDER — CYCLOBENZAPRINE HCL 5 MG PO TABS: 5.0000 mg | ORAL_TABLET | Freq: Three times a day (TID) | ORAL | 4 refills | Status: DC | PRN

## 2017-08-05 NOTE — Telephone Encounter (Signed)
Copied from CRM [id number]. Topic: Quick Communication - Rx Refill/Question >> Aug 05, 2017 11:43 AM Floria RavelingStovall, Shana A wrote: Pt called back again about this med.  He stated that he still wanted this med   Best number [phone]

## 2017-08-05 NOTE — Telephone Encounter (Signed)
Medication filled to pharmacy as requested. Patient notified and verbalized understanding.

## 2017-08-05 NOTE — Telephone Encounter (Signed)
   Okay to refill generic Flexeril No. 30 refill x4

## 2017-08-08 ENCOUNTER — Ambulatory Visit: Payer: Self-pay

## 2017-08-08 NOTE — Telephone Encounter (Signed)
Pt  Reports symptoms of cough -  It is every few  Minutes . Symptoms not relieved by otc meds. Appointment made for tommorow with Dr Carmelia RollerWendling at [facility] unable to find an opening. Spoke with Amalia Haileyustin at the practice no availability as well     Reason for Disposition . [1] Continuous (nonstop) coughing interferes with work or school AND [2] no improvement using cough treatment per protocol  Answer Assessment - Initial Assessment Questions 1. ONSET: "When did the cough begin?"        1 week   2. SEVERITY: "How bad is the cough today?"         Every few minutes  3. RESPIRATORY DISTRESS: "Describe your breathing."        No problems  4. FEVER: "Do you have a fever?" If so, ask: "What is your temperature, how was it measured, and when did it start?"      No fever  5. HEMOPTYSIS: "Are you coughing up any blood?" If so ask: "How much?" (flecks, streaks, tablespoons, etc.)      No blood  6. TREATMENT: "What have you done so far to treat the cough?" (e.g., meds, fluids, humidifier)     mucionex water otc cough drops nasal spray  7. CARDIAC HISTORY: "Do you have any history of heart disease?" (e.g., heart attack, congestive heart failure)         No  8. LUNG HISTORY: "Do you have any history of lung disease?"  (e.g., pulmonary embolus, asthma, emphysema)        No  9. PE RISK FACTORS: "Do you have a history of blood clots?" (or: recent major surgery, recent prolonged travel, bedridden )      No   10. OTHER SYMPTOMS: "Do you have any other symptoms? (e.g., runny nose, wheezing, chest pain)       scratchy throat    11. PREGNANCY: "Is there any chance you are pregnant?" "When was your last menstrual period?"        n/a 12. TRAVEL: "Have you traveled out of the country in the last month?" (e.g., travel history, exposures)         no  Protocols used: COUGH - ACUTE NON-PRODUCTIVE-A-AH

## 2017-08-09 ENCOUNTER — Encounter: Payer: Self-pay | Admitting: Family Medicine

## 2017-08-09 ENCOUNTER — Ambulatory Visit (INDEPENDENT_AMBULATORY_CARE_PROVIDER_SITE_OTHER): Payer: PPO | Admitting: Family Medicine

## 2017-08-09 VITALS — BP 152/78 | HR 83 | Temp 98.2°F | Wt 199.0 lb

## 2017-08-09 DIAGNOSIS — J208 Acute bronchitis due to other specified organisms: Secondary | ICD-10-CM

## 2017-08-09 DIAGNOSIS — B9689 Other specified bacterial agents as the cause of diseases classified elsewhere: Secondary | ICD-10-CM | POA: Diagnosis not present

## 2017-08-09 MED ORDER — AZITHROMYCIN 250 MG PO TABS: ORAL_TABLET | ORAL | 0 refills | Status: DC

## 2017-08-09 MED ORDER — ZOLPIDEM TARTRATE 10 MG PO TABS: ORAL_TABLET | ORAL | 0 refills | Status: DC

## 2017-08-09 NOTE — Patient Instructions (Addendum)
Continue to push fluids, practice good hand hygiene, and cover your mouth if you cough.  If you start having fevers, shaking or shortness of breath, seek immediate care.  Continue nasal spray.  For symptoms, consider using Vick's VapoRub on chest or under nose, air humidifier, Benadryl at night, and elevating the head of the bed. Tylenol and ibuprofen for aches and pains you may be experiencing.   Let us know if you need anything.

## 2017-08-09 NOTE — Progress Notes (Signed)
Chief Complaint  Patient presents with  . Cough    Harold CellarJames B Poulter Jr. here for URI complaints.  Duration: 1 month  Associated symptoms: sinus congestion and cough Denies: sinus pain, rhinorrhea, itchy watery eyes, ear drainage, sore throat, shortness of breath, myalgia and fevers Treatment to date: Mucinex, DM Sick contacts: No  ROS:  Const: Denies fevers HEENT: As noted in HPI Lungs: No SOB  Past Medical History:  Diagnosis Date  . BENIGN PROSTATIC HYPERTROPHY 02/06/2007  . COLONIC POLYPS, HX OF 02/06/2007  . DIVERTICULOSIS, COLON 02/06/2007  . ERECTILE DYSFUNCTION, NON-ORGANIC, MILD 02/23/2009  . HYPERLIPIDEMIA 02/22/2008  . OSTEOARTHRITIS 02/06/2007   BP (!) 152/78   Pulse 83   Temp 98.2 F (36.8 C) (Oral)   Wt 199 lb (90.3 kg)   SpO2 98%   BMI 31.17 kg/m  General: Awake, alert, appears stated age HEENT: AT, [place], ears patent b/l and TM's neg, nares patent w/o discharge, pharynx pink and without exudates, MMM Neck: No masses or asymmetry Heart: RRR Lungs: CTAB, no accessory muscle use Psych: Age appropriate judgment and insight, normal mood and affect  Acute bacterial bronchitis - Plan: azithromycin (ZITHROMAX) 250 MG tablet  Tx given duration.  Continue to push fluids, practice good hand hygiene, cover mouth when coughing. F/u prn. If starting to experience fevers, shaking, or shortness of breath, seek immediate care. Pt voiced understanding and agreement to the plan.  Jilda Rocheicholas Paul NewmanWendling, DO 08/09/17 10:39 AM

## 2017-08-16 ENCOUNTER — Other Ambulatory Visit: Payer: Self-pay | Admitting: Internal Medicine

## 2017-08-16 ENCOUNTER — Other Ambulatory Visit: Payer: Self-pay | Admitting: Family Medicine

## 2017-08-16 DIAGNOSIS — B9689 Other specified bacterial agents as the cause of diseases classified elsewhere: Secondary | ICD-10-CM

## 2017-08-16 DIAGNOSIS — J208 Acute bronchitis due to other specified organisms: Principal | ICD-10-CM

## 2017-09-11 ENCOUNTER — Encounter: Payer: PPO | Admitting: Internal Medicine

## 2017-09-11 ENCOUNTER — Other Ambulatory Visit: Payer: Self-pay | Admitting: Family Medicine

## 2017-09-12 ENCOUNTER — Other Ambulatory Visit: Payer: Self-pay

## 2017-09-12 NOTE — Telephone Encounter (Signed)
He saw me for an acute visit and I refilled while he was in office. Will route to reg PCP for further refills. TY.

## 2017-09-12 NOTE — Telephone Encounter (Signed)
Request sent to provider for approval.  

## 2017-09-12 NOTE — Telephone Encounter (Signed)
Received refill request for zolpidem (AMBIEN) 10 MG tablet. Last office visit and refill 08/09/2017. Please advise.

## 2017-09-15 ENCOUNTER — Other Ambulatory Visit: Payer: Self-pay | Admitting: Internal Medicine

## 2017-09-15 MED ORDER — ZOLPIDEM TARTRATE 10 MG PO TABS: ORAL_TABLET | ORAL | 0 refills | Status: DC

## 2017-09-15 NOTE — Telephone Encounter (Signed)
done

## 2017-09-16 ENCOUNTER — Ambulatory Visit (INDEPENDENT_AMBULATORY_CARE_PROVIDER_SITE_OTHER): Payer: PPO | Admitting: Internal Medicine

## 2017-09-16 ENCOUNTER — Encounter: Payer: Self-pay | Admitting: Internal Medicine

## 2017-09-16 VITALS — BP 120/70 | HR 84 | Temp 98.5°F | Ht 67.75 in | Wt 192.0 lb

## 2017-09-16 DIAGNOSIS — E785 Hyperlipidemia, unspecified: Secondary | ICD-10-CM

## 2017-09-16 DIAGNOSIS — Z0000 Encounter for general adult medical examination without abnormal findings: Secondary | ICD-10-CM

## 2017-09-16 DIAGNOSIS — F900 Attention-deficit hyperactivity disorder, predominantly inattentive type: Secondary | ICD-10-CM | POA: Diagnosis not present

## 2017-09-16 DIAGNOSIS — Z860100 Personal history of colon polyps, unspecified: Secondary | ICD-10-CM

## 2017-09-16 DIAGNOSIS — M150 Primary generalized (osteo)arthritis: Secondary | ICD-10-CM | POA: Diagnosis not present

## 2017-09-16 DIAGNOSIS — M159 Polyosteoarthritis, unspecified: Secondary | ICD-10-CM

## 2017-09-16 LAB — POCT URINALYSIS DIPSTICK
BILIRUBIN UA: NEGATIVE
Blood, UA: NEGATIVE
Glucose, UA: NEGATIVE
KETONES UA: NEGATIVE
Leukocytes, UA: NEGATIVE
Nitrite, UA: NEGATIVE
PROTEIN UA: NEGATIVE
SPEC GRAV UA: 1.02 (ref 1.010–1.025)
Urobilinogen, UA: 0.2 E.U./dL
pH, UA: 6 (ref 5.0–8.0)

## 2017-09-16 MED ORDER — AMPHETAMINE-DEXTROAMPHETAMINE 20 MG PO TABS: 20.0000 mg | ORAL_TABLET | Freq: Two times a day (BID) | ORAL | 0 refills | Status: DC

## 2017-09-16 MED ORDER — AMPHETAMINE-DEXTROAMPHETAMINE 20 MG PO TABS
20.0000 mg | ORAL_TABLET | Freq: Two times a day (BID) | ORAL | 0 refills | Status: DC
Start: 2017-09-16 — End: 2017-09-16

## 2017-09-16 MED ORDER — ZOLPIDEM TARTRATE 10 MG PO TABS: ORAL_TABLET | ORAL | 0 refills | Status: DC

## 2017-09-16 NOTE — Progress Notes (Signed)
Subjective:    Patient ID: Harold Wilson., male    DOB: Apr 01, 1945, 72 y.o.   MRN: [id number]  HPI 72 year old patient who is seen today for annual preventive health examination as well as a subsequent Medicare wellness visit  He has a history of ADHD and has been on Adderall in the past.  He states she wishes to resume therapy since he plans on resuming work. He has osteoarthritis which has been fairly stable. In general doing well.  Last colonoscopy 2015.  Past Medical History:  Diagnosis Date  . BENIGN PROSTATIC HYPERTROPHY 02/06/2007  . COLONIC POLYPS, HX OF 02/06/2007  . DIVERTICULOSIS, COLON 02/06/2007  . ERECTILE DYSFUNCTION, NON-ORGANIC, MILD 02/23/2009  . HYPERLIPIDEMIA 02/22/2008  . OSTEOARTHRITIS 02/06/2007     Social History   Socioeconomic History  . Marital status: Married    Spouse name: Not on file  . Number of children: Not on file  . Years of education: Not on file  . Highest education level: Not on file  Occupational History  . Not on file  Social Needs  . Financial resource strain: Not on file  . Food insecurity:    Worry: Not on file    Inability: Not on file  . Transportation needs:    Medical: Not on file    Non-medical: Not on file  Tobacco Use  . Smoking status: Never Smoker  . Smokeless tobacco: Never Used  Substance and Sexual Activity  . Alcohol use: Yes    Alcohol/week: 0.6 oz    Types: 1 Cans of beer per week  . Drug use: No  . Sexual activity: Not on file  Lifestyle  . Physical activity:    Days per week: Not on file    Minutes per session: Not on file  . Stress: Not on file  Relationships  . Social connections:    Talks on phone: Not on file    Gets together: Not on file    Attends religious service: Not on file    Active member of club or organization: Not on file    Attends meetings of clubs or organizations: Not on file    Relationship status: Not on file  . Intimate partner violence:    Fear of current or ex  partner: Not on file    Emotionally abused: Not on file    Physically abused: Not on file    Forced sexual activity: Not on file  Other Topics Concern  . Not on file  Social History Narrative  . Not on file    Past Surgical History:  Procedure Laterality Date  . CERVICAL SPINE SURGERY    . ROTATOR CUFF REPAIR    . TEMPOROMANDIBULAR JOINT SURGERY    . TONSILLECTOMY    . VASECTOMY      Family History  Problem Relation Age of Onset  . COPD Mother   . Heart disease Mother   . Cancer Father        lung ca    Allergies  Allergen Reactions  . Nickel Rash  . Sulfacetamide Sodium     Dryness of mouth     Current Outpatient Medications on File Prior to Visit  Medication Sig Dispense Refill  . FLUoxetine (PROZAC) 20 MG capsule TAKE 1 BY MOUTH DAILY 90 capsule 3  . fluticasone (FLONASE) 50 MCG/ACT nasal spray Place 2 sprays into both nostrils daily. 48 g 3  . naproxen (NAPROXEN DR) 500 MG EC tablet Take 1 tablet (500  mg total) by mouth 2 (two) times daily with a meal. 180 tablet 3  . niacin (NIASPAN) 1000 MG CR tablet Take 1 tablet (1,000 mg total) by mouth at bedtime. 90 tablet 3  . sildenafil (REVATIO) 20 MG tablet TAKE TWO TABLETS BY MOUTH DAILY AS DIRECTED 150 tablet 0  . tamsulosin (FLOMAX) 0.4 MG CAPS capsule Take 1 capsule (0.4 mg total) by mouth daily. 90 capsule 3  . zolpidem (AMBIEN) 10 MG tablet TAKE ONE TABLET BY MOUTH EVERY NIGHT AT BEDTIME AS NEEDED 30 tablet 0   No current facility-administered medications on file prior to visit.     BP 120/70 (BP Location: Right Arm, Patient Position: Sitting, Cuff Size: Large)   Pulse 84   Temp 98.5 F (36.9 C) (Oral)   Ht 5' 7.75" (1.721 m)   Wt 192 lb (87.1 kg)   SpO2 97%   BMI 29.41 kg/m   Subsequent Medicare wellness visit   1. Risk factors, based on past  M,S,F history.  Cardiovascular risk factors include a history of mild dyslipidemia only  2.  Physical activities: Limited slightly with arthritis walks  daily  3.  Depression/mood: Has been followed by behavioral health in the past has a history of ADHD as well as anxiety disorder remains on Prozac ; has been followed by Dr. Evelene CroonKaur in the past  4.  Hearing: Mild to moderate deficits.  He states that he now needs to turn up the volume of his TV somewhat  5.  ADL's: Independent  6.  Fall risk: Low  7.  Home safety: No problems identified  8.  Height weight, and visual acuity; height and weight stable no change in visual acuity  9.  Counseling: Heart healthy diet recommended more rigorous exercise activities encouraged  10. Lab orders based on risk factors: Laboratory update will be reviewed including lipid profile  11. Referral : None appropriate at this time  12. Care plan: Continue efforts at aggressive risk factor modification  13. Cognitive assessment: Alert and appropriate normal affect.  No cognitive dysfunction  14. Screening: Patient provided with a written and personalized 5-10 year screening schedule in the AVS.    15. Provider List Update: Primary care orthopedics and GI as well as ophthalmology     Review of Systems  Constitutional: Negative for appetite change, chills, fatigue and fever.  HENT: Negative for congestion, dental problem, ear pain, hearing loss, sore throat, tinnitus, trouble swallowing and voice change.   Eyes: Negative for pain, discharge and visual disturbance.  Respiratory: Negative for cough, chest tightness, wheezing and stridor.   Cardiovascular: Negative for chest pain, palpitations and leg swelling.  Gastrointestinal: Negative for abdominal distention, abdominal pain, blood in stool, constipation, diarrhea, nausea and vomiting.  Genitourinary: Negative for difficulty urinating, discharge, flank pain, genital sores, hematuria and urgency.  Musculoskeletal: Positive for arthralgias. Negative for back pain, gait problem, joint swelling, myalgias and neck stiffness.  Skin: Negative for rash.   Neurological: Negative for dizziness, syncope, speech difficulty, weakness, numbness and headaches.  Hematological: Negative for adenopathy. Does not bruise/bleed easily.  Psychiatric/Behavioral: Positive for decreased concentration and sleep disturbance. Negative for behavioral problems and dysphoric mood. The patient is not nervous/anxious.        Objective:   Physical Exam  Constitutional: He appears well-developed and well-nourished.  HENT:  Head: Normocephalic and atraumatic.  Right Ear: External ear normal.  Left Ear: External ear normal.  Nose: Nose normal.  Mouth/Throat: Oropharynx is clear and moist.  Eyes:  Pupils are equal, round, and reactive to light. Conjunctivae and EOM are normal. No scleral icterus.  Neck: Normal range of motion. Neck supple. No JVD present. No thyromegaly present.  Cardiovascular: Regular rhythm, normal heart sounds and intact distal pulses. Exam reveals no gallop and no friction rub.  No murmur heard. Pulmonary/Chest: Effort normal and breath sounds normal. He exhibits no tenderness.  Abdominal: Soft. Bowel sounds are normal. He exhibits no distension and no mass. There is no tenderness.  Genitourinary: Penis normal. Rectal exam shows guaiac negative stool.  Genitourinary Comments: Mild prostate enlargement right lobe slightly more prominent  Musculoskeletal: Normal range of motion. He exhibits no edema or tenderness.  Lymphadenopathy:    He has no cervical adenopathy.  Neurological: He is alert. He has normal reflexes. No cranial nerve deficit. Coordination normal.  Skin: Skin is warm and dry. No rash noted.  Psychiatric: He has a normal mood and affect. His behavior is normal.          Assessment & Plan:  Preventive health examination  Subsequent Medicare wellness visit Osteoarthritis BPH ADHD.  Will resume prior dose of Adderall 20 mg twice daily  Will review updated lab  [employer]

## 2017-09-16 NOTE — Patient Instructions (Addendum)
Return for follow-up in 3 months    It is important that you exercise regularly, at least 20 minutes 3 to 4 times per week.  If you develop chest pain or shortness of breath seek  medical attention.

## 2017-09-17 LAB — COMPREHENSIVE METABOLIC PANEL
ALBUMIN: 4.4 g/dL (ref 3.5–5.2)
ALT: 12 U/L (ref 0–53)
AST: 16 U/L (ref 0–37)
Alkaline Phosphatase: 44 U/L (ref 39–117)
BUN: 15 mg/dL (ref 6–23)
CHLORIDE: 106 meq/L (ref 96–112)
CO2: 27 mEq/L (ref 19–32)
CREATININE: 1.23 mg/dL (ref 0.40–1.50)
Calcium: 8.9 mg/dL (ref 8.4–10.5)
GFR: 61.91 mL/min (ref >60.00)
GLUCOSE: 78 mg/dL (ref 70–99)
Potassium: 4.1 mEq/L (ref 3.5–5.1)
SODIUM: 142 meq/L (ref 135–145)
Total Bilirubin: 1.1 mg/dL (ref 0.2–1.2)
Total Protein: 6.5 g/dL (ref 6.0–8.3)

## 2017-09-17 LAB — CBC WITH DIFFERENTIAL/PLATELET
BASOS PCT: 1.4 % (ref 0.0–3.0)
Basophils Absolute: 0.1 10*3/uL (ref 0.0–0.1)
EOS ABS: 0 10*3/uL (ref 0.0–0.7)
EOS PCT: 0.4 % (ref 0.0–5.0)
HCT: 40.8 % (ref 39.0–52.0)
HEMOGLOBIN: 14.1 g/dL (ref 13.0–17.0)
LYMPHS ABS: 1.2 10*3/uL (ref 0.7–4.0)
Lymphocytes Relative: 18.2 % (ref 12.0–46.0)
MCHC: 34.6 g/dL (ref 30.0–36.0)
MCV: 88.6 fl (ref 78.0–100.0)
MONO ABS: 0.5 10*3/uL (ref 0.1–1.0)
Monocytes Relative: 7.1 % (ref 3.0–12.0)
NEUTROS ABS: 4.7 10*3/uL (ref 1.4–7.7)
NEUTROS PCT: 72.9 % (ref 43.0–77.0)
PLATELETS: 146 10*3/uL — AB (ref 150.0–400.0)
RBC: 4.61 Mil/uL (ref 4.22–5.81)
RDW: 14 % (ref 11.5–15.5)
WBC: 6.4 10*3/uL (ref 4.0–10.5)

## 2017-09-17 LAB — LIPID PANEL
Cholesterol: 134 mg/dL (ref 0–200)
HDL: 39.9 mg/dL (ref >39.00)
LDL CALC: 83 mg/dL (ref 0–99)
NONHDL: 94.27
Total CHOL/HDL Ratio: 3
Triglycerides: 56 mg/dL (ref 0.0–149.0)
VLDL: 11.2 mg/dL (ref 0.0–40.0)

## 2017-09-17 LAB — TSH: TSH: 1.79 u[IU]/mL (ref 0.35–4.50)

## 2017-09-19 ENCOUNTER — Telehealth: Payer: Self-pay | Admitting: Internal Medicine

## 2017-09-19 NOTE — Telephone Encounter (Signed)
[facility]'s [facility], however [facility]. Patient is out of his medication.

## 2017-09-19 NOTE — Telephone Encounter (Signed)
Rx canceled at [facility] and called to [facility] as requested. Patient notified and verbalized understanding.  Patient also requested lab results from 09/16/17. Notified patient that labs were stable.

## 2017-09-22 ENCOUNTER — Other Ambulatory Visit: Payer: Self-pay | Admitting: Internal Medicine

## 2017-09-22 NOTE — Telephone Encounter (Signed)
Copied from CRM [id number]. Topic: Quick Communication - See Telephone Encounter >> Sep 22, 2017  2:06 PM Trula SladeWalter, Linda F wrote: CRM for notification. See Telephone encounter for: 09/22/17. Patient stated that his mail order pharmacy [facility] [place] - [phone] said they have not received the following mail order prescriptions:   1)FLUoxetine (PROZAC) 20 MG capsule 2) fluticasone (FLONASE) 50 MCG/ACT nasal spray  3) naproxen (NAPROXEN DR) 500 MG EC tablet 4) niacin (NIASPAN) 1000 MG CR tablet 5) tamsulosin (FLOMAX) 0.4 MG CAPS capsule

## 2017-09-23 MED ORDER — NIACIN ER (ANTIHYPERLIPIDEMIC) 1000 MG PO TBCR: 1000.0000 mg | EXTENDED_RELEASE_TABLET | Freq: Every day | ORAL | 3 refills | Status: DC

## 2017-09-23 MED ORDER — FLUTICASONE PROPIONATE 50 MCG/ACT NA SUSP: 2.0000 | Freq: Every day | NASAL | 3 refills | Status: DC

## 2017-09-23 MED ORDER — TAMSULOSIN HCL 0.4 MG PO CAPS: 0.4000 mg | ORAL_CAPSULE | Freq: Every day | ORAL | 3 refills | Status: DC

## 2017-09-23 MED ORDER — FLUOXETINE HCL 20 MG PO CAPS: ORAL_CAPSULE | ORAL | 3 refills | Status: DC

## 2017-09-23 NOTE — Telephone Encounter (Signed)
Refill of naproxen  LRF 09/09/16  #180  LOV 09/15/17 Dr. Amador CunasKwiatkowski

## 2017-09-24 MED ORDER — NAPROXEN 500 MG PO TBEC: 500.0000 mg | DELAYED_RELEASE_TABLET | Freq: Two times a day (BID) | ORAL | 3 refills | Status: DC

## 2017-09-26 NOTE — Telephone Encounter (Signed)
Fax received from [facility] stating the request for Zolpidem tartrate 10mg  was approved from 09/23/2017-03/03/2018 and this was sent to be scanned.

## 2017-10-07 ENCOUNTER — Ambulatory Visit (INDEPENDENT_AMBULATORY_CARE_PROVIDER_SITE_OTHER): Payer: PPO | Admitting: Adult Health

## 2017-10-07 ENCOUNTER — Encounter: Payer: Self-pay | Admitting: Adult Health

## 2017-10-07 VITALS — BP 130/86 | Temp 98.2°F | Wt 194.0 lb

## 2017-10-07 DIAGNOSIS — F900 Attention-deficit hyperactivity disorder, predominantly inattentive type: Secondary | ICD-10-CM | POA: Diagnosis not present

## 2017-10-07 DIAGNOSIS — F5101 Primary insomnia: Secondary | ICD-10-CM

## 2017-10-07 DIAGNOSIS — Z7689 Persons encountering health services in other specified circumstances: Secondary | ICD-10-CM | POA: Diagnosis not present

## 2017-10-07 NOTE — Progress Notes (Signed)
Patient presents to clinic today to establish care. He is a pleasant 72 year old male who  has a past medical history of BENIGN PROSTATIC HYPERTROPHY (02/06/2007), COLONIC POLYPS, HX OF (02/06/2007), DIVERTICULOSIS, COLON (02/06/2007), ERECTILE DYSFUNCTION, NON-ORGANIC, MILD (02/23/2009), HYPERLIPIDEMIA (02/22/2008), and OSTEOARTHRITIS (02/06/2007).  He is a former patient of Dr. Kirtland BouchardK who is transferring care to me as his PCP is retiring from practice  His last CPE was July 2019    Acute Concerns: Establish Care   Chronic Issues: Insomnia - controlled with Ambien 10 mg QHS   ADHD - Controlled with Adderall 20 mg BID   Anxiety - Is controlled with Prozac 20 mg   BPH - Controlled with Flomax   Hyperlipidemia - diet controlled.  Lab Results  Component Value Date   CHOL 134 09/16/2017   HDL 39.90 09/16/2017   LDLCALC 83 09/16/2017   TRIG 56.0 09/16/2017   CHOLHDL 3 09/16/2017    Health Maintenance: Dental -- Routine visits [facility] -- Routine Visits  Immunizations -- UTD Colonoscopy -- Due in 2025    Past Medical History:  Diagnosis Date  . BENIGN PROSTATIC HYPERTROPHY 02/06/2007  . COLONIC POLYPS, HX OF 02/06/2007  . DIVERTICULOSIS, COLON 02/06/2007  . ERECTILE DYSFUNCTION, NON-ORGANIC, MILD 02/23/2009  . HYPERLIPIDEMIA 02/22/2008  . OSTEOARTHRITIS 02/06/2007    Past Surgical History:  Procedure Laterality Date  . CERVICAL SPINE SURGERY    . ROTATOR CUFF REPAIR    . TEMPOROMANDIBULAR JOINT SURGERY    . TONSILLECTOMY    . VASECTOMY      Current Outpatient Medications on File Prior to Visit  Medication Sig Dispense Refill  . amphetamine-dextroamphetamine (ADDERALL) 20 MG tablet Take 1 tablet (20 mg total) by mouth 2 (two) times daily. 60 tablet 0  . FLUoxetine (PROZAC) 20 MG capsule TAKE 1 BY MOUTH DAILY 90 capsule 3  . fluticasone (FLONASE) 50 MCG/ACT nasal spray Place 2 sprays into both nostrils daily. 48 g 3  . naproxen (NAPROXEN DR) 500 MG EC tablet Take 1 tablet  (500 mg total) by mouth 2 (two) times daily with a meal. 180 tablet 3  . niacin (NIASPAN) 1000 MG CR tablet Take 1 tablet (1,000 mg total) by mouth at bedtime. 90 tablet 3  . sildenafil (REVATIO) 20 MG tablet TAKE TWO TABLETS BY MOUTH DAILY AS DIRECTED 150 tablet 0  . tamsulosin (FLOMAX) 0.4 MG CAPS capsule Take 1 capsule (0.4 mg total) by mouth daily. 90 capsule 3  . zolpidem (AMBIEN) 10 MG tablet TAKE ONE TABLET BY MOUTH EVERY NIGHT AT BEDTIME AS NEEDED 30 tablet 0   No current facility-administered medications on file prior to visit.     Allergies  Allergen Reactions  . Nickel Rash  . Sulfacetamide Sodium     Dryness of mouth     Family History  Problem Relation Age of Onset  . COPD Mother   . Heart disease Mother   . Cancer Father        lung ca    Social History   Socioeconomic History  . Marital status: Married    Spouse name: Not on file  . Number of children: Not on file  . Years of education: Not on file  . Highest education level: Not on file  Occupational History  . Not on file  Social Needs  . Financial resource strain: Not on file  . Food insecurity:    Worry: Not on file    Inability: Not on file  .  Transportation needs:    Medical: Not on file    Non-medical: Not on file  Tobacco Use  . Smoking status: Never Smoker  . Smokeless tobacco: Never Used  Substance and Sexual Activity  . Alcohol use: Yes    Alcohol/week: 0.6 oz    Types: 1 Cans of beer per week  . Drug use: No  . Sexual activity: Not on file  Lifestyle  . Physical activity:    Days per week: Not on file    Minutes per session: Not on file  . Stress: Not on file  Relationships  . Social connections:    Talks on phone: Not on file    Gets together: Not on file    Attends religious service: Not on file    Active member of club or organization: Not on file    Attends meetings of clubs or organizations: Not on file    Relationship status: Not on file  . Intimate partner violence:     Fear of current or ex partner: Not on file    Emotionally abused: Not on file    Physically abused: Not on file    Forced sexual activity: Not on file  Other Topics Concern  . Not on file  Social History Narrative   Retired    Married     Review of Systems  Constitutional: Negative.   HENT: Negative.   Eyes: Negative.   Respiratory: Negative.   Cardiovascular: Negative.   Gastrointestinal: Negative.   Genitourinary: Negative.   Musculoskeletal: Positive for back pain.  Neurological: Negative.   Psychiatric/Behavioral: Negative for depression. The patient is nervous/anxious and has insomnia.   All other systems reviewed and are negative.     BP 130/86   Temp 98.2 F (36.8 C) (Oral)   Wt 194 lb (88 kg)   BMI 29.72 kg/m   Physical Exam  Constitutional: He is oriented to person, place, and time. He appears well-developed and well-nourished. No distress.  Eyes: Pupils are equal, round, and reactive to light. Conjunctivae and EOM are normal.  Cardiovascular: Normal rate, regular rhythm, normal heart sounds and intact distal pulses.  Pulmonary/Chest: Effort normal and breath sounds normal.  Abdominal: Soft. Bowel sounds are normal. He exhibits no distension and no mass. There is no tenderness. There is no rebound and no guarding. No hernia.  Musculoskeletal: Normal range of motion. He exhibits no edema, tenderness or deformity.  Neurological: He is alert and oriented to person, place, and time.  Skin: Skin is warm and dry. Capillary refill takes less than 2 seconds. He is not diaphoretic.  Psychiatric: He has a normal mood and affect. His behavior is normal. Judgment and thought content normal.  Nursing note and vitals reviewed.   Assessment/Plan: 1. Encounter to establish care - Follow up in July for CPE  - Follow up sooner if needed  2. Primary insomnia - Controlled on Ambien   3. Attention deficit hyperactivity disorder (ADHD), predominantly inattentive type -ok  with continuing Adderall 20 mg  - Follow up in 6 months   [name], NP

## 2017-10-13 ENCOUNTER — Other Ambulatory Visit: Payer: Self-pay | Admitting: Internal Medicine

## 2017-10-13 NOTE — Telephone Encounter (Signed)
Cory's Pt 

## 2017-10-14 NOTE — Telephone Encounter (Signed)
Done

## 2017-10-14 NOTE — Telephone Encounter (Signed)
Last filled on 09/16/17 for 30 days.  Kandee KeenCory out of the office this week.  Please advise.

## 2017-11-05 ENCOUNTER — Other Ambulatory Visit: Payer: Self-pay | Admitting: Internal Medicine

## 2017-11-05 NOTE — Telephone Encounter (Signed)
Harold Wilson, pt has newly established with you.  Are you filling medication?

## 2017-11-05 NOTE — Telephone Encounter (Signed)
Ok to refill 

## 2017-11-05 NOTE — Telephone Encounter (Signed)
Sent to the pharmacy by e-scribe. 

## 2017-11-05 NOTE — Telephone Encounter (Signed)
Harold Wilson Pt. 

## 2017-11-24 ENCOUNTER — Other Ambulatory Visit: Payer: Self-pay | Admitting: Family Medicine

## 2017-11-25 NOTE — Telephone Encounter (Signed)
Patient would like refills added to this zolpidem (AMBIEN) 10 MG tablet, so he does not have to call each month, please advise.

## 2017-11-26 NOTE — Telephone Encounter (Signed)
Last refill 10/14/17 Last office visit 10/07/17 Okay to fill?

## 2018-01-15 ENCOUNTER — Other Ambulatory Visit: Payer: Self-pay | Admitting: Adult Health

## 2018-01-16 NOTE — Telephone Encounter (Signed)
Ok to refill + 1  

## 2018-01-16 NOTE — Telephone Encounter (Signed)
Sent to the pharmacy by e-scribe. 

## 2018-01-16 NOTE — Telephone Encounter (Signed)
Cory, you have not prescribed in the past.  Please advise. 

## 2018-02-17 ENCOUNTER — Other Ambulatory Visit: Payer: Self-pay | Admitting: Adult Health

## 2018-03-13 ENCOUNTER — Ambulatory Visit (INDEPENDENT_AMBULATORY_CARE_PROVIDER_SITE_OTHER): Payer: PPO

## 2018-03-13 ENCOUNTER — Ambulatory Visit (INDEPENDENT_AMBULATORY_CARE_PROVIDER_SITE_OTHER): Payer: PPO | Admitting: Adult Health

## 2018-03-13 ENCOUNTER — Ambulatory Visit: Payer: Self-pay

## 2018-03-13 ENCOUNTER — Encounter: Payer: Self-pay | Admitting: Adult Health

## 2018-03-13 VITALS — BP 160/90 | Temp 98.1°F | Wt 197.0 lb

## 2018-03-13 DIAGNOSIS — R079 Chest pain, unspecified: Secondary | ICD-10-CM

## 2018-03-13 DIAGNOSIS — R05 Cough: Secondary | ICD-10-CM

## 2018-03-13 DIAGNOSIS — R059 Cough, unspecified: Secondary | ICD-10-CM

## 2018-03-13 DIAGNOSIS — R0789 Other chest pain: Secondary | ICD-10-CM | POA: Diagnosis not present

## 2018-03-13 MED ORDER — PREDNISONE 10 MG PO TABS: ORAL_TABLET | ORAL | 0 refills | Status: DC

## 2018-03-13 NOTE — Telephone Encounter (Signed)
Patient called in with c/o "chest pain." He says "the chest pain has been going on since August when I last saw [facility], but it's been off an on. The past week or so, it's been a constant pain above my left breast area, a 4-5. Coughing can make it worse sometimes. I have a dry cough." I asked about other symptoms, he denies. According to protocol, see PCP within 3 days. Patient asks for next week, but I advised the first available is next Thursday and he will need to be seen sooner. I advised of the appointment today, he agrees. Appointment scheduled for today at 1515 with [name], NP, care advice given, patient verbalized understanding.   Reason for Disposition . Chest pain(s) lasting a few seconds from coughing AND [2] persists > 3 days  Answer Assessment - Initial Assessment Questions 1. LOCATION: "Where does it hurt?"       Above the left breast 2. RADIATION: "Does the pain go anywhere else?" (e.g., into neck, jaw, arms, back)     No 3. ONSET: "When did the chest pain begin?" (Minutes, hours or days)      Reoccurring since August 4. PATTERN "Does the pain come and go, or has it been constant since it started?"  "Does it get worse with exertion?"      Now it's constant since a week or so 5. DURATION: "How long does it last" (e.g., seconds, minutes, hours)     N/A 6. SEVERITY: "How bad is the pain?"  (e.g., Scale 1-10; mild, moderate, or severe)    - MILD (1-3): doesn't interfere with normal activities     - MODERATE (4-7): interferes with normal activities or awakens from sleep    - SEVERE (8-10): excruciating pain, unable to do any normal activities       4-5 7. CARDIAC RISK FACTORS: "Do you have any history of heart problems or risk factors for heart disease?" (e.g., prior heart attack, angina; high blood pressure, diabetes, being overweight, high cholesterol, smoking, or strong family history of heart disease)     No 8. PULMONARY RISK FACTORS: "Do you have any history of lung  disease?"  (e.g., blood clots in lung, asthma, emphysema, birth control pills)     No 9. CAUSE: "What do you think is causing the chest pain?"     I don't know 10. OTHER SYMPTOMS: "Do you have any other symptoms?" (e.g., dizziness, nausea, vomiting, sweating, fever, difficulty breathing, cough)       Cough-dry 11. PREGNANCY: "Is there any chance you are pregnant?" "When was your last menstrual period?"       N/A  Protocols used: CHEST PAIN-A-AH

## 2018-03-13 NOTE — Telephone Encounter (Signed)
Noted  

## 2018-03-13 NOTE — Progress Notes (Signed)
Subjective:    Patient ID: Harold CellarJames B Picker Jr., male    DOB: 11/07/1945, 73 y.o.   MRN: [id number]  HPI 73 year old male who  has a past medical history of BENIGN PROSTATIC HYPERTROPHY (02/06/2007), COLONIC POLYPS, HX OF (02/06/2007), DIVERTICULOSIS, COLON (02/06/2007), ERECTILE DYSFUNCTION, NON-ORGANIC, MILD (02/23/2009), HYPERLIPIDEMIA (02/22/2008), and OSTEOARTHRITIS (02/06/2007).  He presents to the office today for constant left sided chest pain that has been constant. Pain has been present since August but has become progressively constant. Pain is non radiating and is described as " soreness that is coming from the inside. Nothing helps with the pain and nothing makes it feel better.    He also reports a chronic dry cough for about the same amount of time   He denies fevers, shortness of breath or feeling acutely ill   Review of Systems See HPI   Past Medical History:  Diagnosis Date  . BENIGN PROSTATIC HYPERTROPHY 02/06/2007  . COLONIC POLYPS, HX OF 02/06/2007  . DIVERTICULOSIS, COLON 02/06/2007  . ERECTILE DYSFUNCTION, NON-ORGANIC, MILD 02/23/2009  . HYPERLIPIDEMIA 02/22/2008  . OSTEOARTHRITIS 02/06/2007    Social History   Socioeconomic History  . Marital status: Married    Spouse name: Not on file  . Number of children: Not on file  . Years of education: Not on file  . Highest education level: Not on file  Occupational History  . Not on file  Social Needs  . Financial resource strain: Not on file  . Food insecurity:    Worry: Not on file    Inability: Not on file  . Transportation needs:    Medical: Not on file    Non-medical: Not on file  Tobacco Use  . Smoking status: Never Smoker  . Smokeless tobacco: Never Used  Substance and Sexual Activity  . Alcohol use: Yes    Alcohol/week: 1.0 standard drinks    Types: 1 Cans of beer per week  . Drug use: No  . Sexual activity: Not on file  Lifestyle  . Physical activity:    Days per week: Not on file    Minutes  per session: Not on file  . Stress: Not on file  Relationships  . Social connections:    Talks on phone: Not on file    Gets together: Not on file    Attends religious service: Not on file    Active member of club or organization: Not on file    Attends meetings of clubs or organizations: Not on file    Relationship status: Not on file  . Intimate partner violence:    Fear of current or ex partner: Not on file    Emotionally abused: Not on file    Physically abused: Not on file    Forced sexual activity: Not on file  Other Topics Concern  . Not on file  Social History Narrative   Retired    Married     Past Surgical History:  Procedure Laterality Date  . CERVICAL SPINE SURGERY    . ROTATOR CUFF REPAIR    . TEMPOROMANDIBULAR JOINT SURGERY    . TONSILLECTOMY    . VASECTOMY      Family History  Problem Relation Age of Onset  . COPD Mother   . Heart disease Mother   . Cancer Father        lung ca    Allergies  Allergen Reactions  . Nickel Rash  . Sulfacetamide Sodium     Dryness  of mouth     Current Outpatient Medications on File Prior to Visit  Medication Sig Dispense Refill  . FLUoxetine (PROZAC) 20 MG capsule TAKE 1 BY MOUTH DAILY 90 capsule 3  . fluticasone (FLONASE) 50 MCG/ACT nasal spray Place 2 sprays into both nostrils daily. 48 g 3  . naproxen (NAPROXEN DR) 500 MG EC tablet Take 1 tablet (500 mg total) by mouth 2 (two) times daily with a meal. 180 tablet 3  . niacin (NIASPAN) 1000 MG CR tablet Take 1 tablet (1,000 mg total) by mouth at bedtime. 90 tablet 3  . sildenafil (REVATIO) 20 MG tablet TAKE TWO TABLETS BY MOUTH DAILY AS DIRECTED 150 tablet 1  . tamsulosin (FLOMAX) 0.4 MG CAPS capsule Take 1 capsule (0.4 mg total) by mouth daily. 90 capsule 3  . zolpidem (AMBIEN) 10 MG tablet TAKE ONE TABLET BY MOUTH EVERY NIGHT AT BEDTIME AS NEEDED 30 tablet 1  . amphetamine-dextroamphetamine (ADDERALL) 20 MG tablet Take 1 tablet (20 mg total) by mouth 2 (two) times  daily. (Patient not taking: Reported on 03/13/2018) 60 tablet 0   No current facility-administered medications on file prior to visit.     BP (!) 160/90   Temp 98.1 F (36.7 C)   Wt 197 lb (89.4 kg)   BMI 30.18 kg/m       Objective:   Physical Exam Vitals signs and nursing note reviewed.  Constitutional:      Appearance: Normal appearance.  Cardiovascular:     Rate and Rhythm: Normal rate and regular rhythm.     Pulses: Normal pulses.     Heart sounds: Normal heart sounds.  Pulmonary:     Effort: Pulmonary effort is normal.     Breath sounds: Normal breath sounds.  Musculoskeletal:        General: No swelling, tenderness or deformity.     Comments: Slight tenderness to left breast with palpation.   No abnormal breast tissue   Skin:    General: Skin is warm and dry.  Neurological:     General: No focal deficit present.     Mental Status: He is alert and oriented to person, place, and time.       Assessment & Plan:  1. Left-sided chest pain - Possible muscular pain such as costochondritis form coughing. No concerned for cardiac issue at this time   - EKG 12-Lead- NSR, rate 68 - CBC with Differential/Platelet - Basic Metabolic Panel - DG Chest 2 View; Future  2. Cough - Will prescribe prednisone course. If this does not resolve cough then will trial prilosec for possible GERD  - predniSONE (DELTASONE) 10 MG tablet; 40 mg x 3 days, 20 mg x 3 days, 10 mg x 3 days  Dispense: 21 tablet; Refill: 0  [name], NP

## 2018-03-14 LAB — BASIC METABOLIC PANEL
BUN: 13 mg/dL (ref 7–25)
CO2: 27 mmol/L (ref 20–32)
CREATININE: 1.06 mg/dL (ref 0.70–1.18)
Calcium: 8.4 mg/dL — ABNORMAL LOW (ref 8.6–10.3)
Chloride: 110 mmol/L (ref 98–110)
Glucose, Bld: 87 mg/dL (ref 65–99)
Potassium: 3.6 mmol/L (ref 3.5–5.3)
Sodium: 143 mmol/L (ref 135–146)

## 2018-03-14 LAB — CBC WITH DIFFERENTIAL/PLATELET
Absolute Monocytes: 392 cells/uL (ref 200–950)
BASOS PCT: 0.8 %
Basophils Absolute: 32 cells/uL (ref 0–200)
Eosinophils Absolute: 60 cells/uL (ref 15–500)
Eosinophils Relative: 1.5 %
HCT: 37.1 % — ABNORMAL LOW (ref 38.5–50.0)
HEMOGLOBIN: 13 g/dL — AB (ref 13.2–17.1)
Lymphs Abs: 928 cells/uL (ref 850–3900)
MCH: 30.7 pg (ref 27.0–33.0)
MCHC: 35 g/dL (ref 32.0–36.0)
MCV: 87.7 fL (ref 80.0–100.0)
MPV: 10.1 fL (ref 7.5–12.5)
Monocytes Relative: 9.8 %
NEUTROS ABS: 2588 {cells}/uL (ref 1500–7800)
Neutrophils Relative %: 64.7 %
Platelets: 136 10*3/uL — ABNORMAL LOW (ref 140–400)
RBC: 4.23 10*6/uL (ref 4.20–5.80)
RDW: 13 % (ref 11.0–15.0)
Total Lymphocyte: 23.2 %
WBC: 4 10*3/uL (ref 3.8–10.8)

## 2018-03-16 ENCOUNTER — Telehealth: Payer: Self-pay | Admitting: Adult Health

## 2018-03-16 NOTE — Telephone Encounter (Signed)
Copied from CRM [id number]. Topic: Quick Communication - See Telephone Encounter >> Mar 16, 2018  3:00 PM Terisa Starr wrote: CRM for notification. See Telephone encounter for: 03/16/18.  Patient would like a call back when his lab results come back in from 1/10

## 2018-03-17 NOTE — Telephone Encounter (Signed)
Noted  

## 2018-04-19 ENCOUNTER — Other Ambulatory Visit: Payer: Self-pay | Admitting: Adult Health

## 2018-05-21 ENCOUNTER — Other Ambulatory Visit: Payer: Self-pay | Admitting: Adult Health

## 2018-05-22 NOTE — Telephone Encounter (Signed)
Last Rx given on 2/18 for #30 with no ref

## 2018-09-24 ENCOUNTER — Other Ambulatory Visit: Payer: Self-pay

## 2018-09-24 ENCOUNTER — Ambulatory Visit (INDEPENDENT_AMBULATORY_CARE_PROVIDER_SITE_OTHER): Payer: PPO | Admitting: Adult Health

## 2018-09-24 ENCOUNTER — Encounter: Payer: Self-pay | Admitting: Adult Health

## 2018-09-24 VITALS — BP 136/80 | Temp 98.4°F | Ht 69.0 in | Wt 187.0 lb

## 2018-09-24 DIAGNOSIS — N401 Enlarged prostate with lower urinary tract symptoms: Secondary | ICD-10-CM | POA: Diagnosis not present

## 2018-09-24 DIAGNOSIS — R351 Nocturia: Secondary | ICD-10-CM

## 2018-09-24 DIAGNOSIS — Z0000 Encounter for general adult medical examination without abnormal findings: Secondary | ICD-10-CM

## 2018-09-24 DIAGNOSIS — F419 Anxiety disorder, unspecified: Secondary | ICD-10-CM | POA: Diagnosis not present

## 2018-09-24 DIAGNOSIS — F528 Other sexual dysfunction not due to a substance or known physiological condition: Secondary | ICD-10-CM | POA: Diagnosis not present

## 2018-09-24 DIAGNOSIS — R079 Chest pain, unspecified: Secondary | ICD-10-CM | POA: Diagnosis not present

## 2018-09-24 DIAGNOSIS — E785 Hyperlipidemia, unspecified: Secondary | ICD-10-CM | POA: Diagnosis not present

## 2018-09-24 DIAGNOSIS — F5101 Primary insomnia: Secondary | ICD-10-CM | POA: Diagnosis not present

## 2018-09-24 LAB — CBC WITH DIFFERENTIAL/PLATELET
Basophils Absolute: 0 10*3/uL (ref 0.0–0.1)
Basophils Relative: 0.5 % (ref 0.0–3.0)
Eosinophils Absolute: 0.1 10*3/uL (ref 0.0–0.7)
Eosinophils Relative: 2.8 % (ref 0.0–5.0)
HCT: 41.3 % (ref 39.0–52.0)
Hemoglobin: 13.9 g/dL (ref 13.0–17.0)
Lymphocytes Relative: 30.4 % (ref 12.0–46.0)
Lymphs Abs: 1.2 10*3/uL (ref 0.7–4.0)
MCHC: 33.7 g/dL (ref 30.0–36.0)
MCV: 90.3 fl (ref 78.0–100.0)
Monocytes Absolute: 0.4 10*3/uL (ref 0.1–1.0)
Monocytes Relative: 10 % (ref 3.0–12.0)
Neutro Abs: 2.3 10*3/uL (ref 1.4–7.7)
Neutrophils Relative %: 56.3 % (ref 43.0–77.0)
Platelets: 130 10*3/uL — ABNORMAL LOW (ref 150.0–400.0)
RBC: 4.57 Mil/uL (ref 4.22–5.81)
RDW: 14.4 % (ref 11.5–15.5)
WBC: 4 10*3/uL (ref 4.0–10.5)

## 2018-09-24 LAB — LIPID PANEL
Cholesterol: 151 mg/dL (ref 0–200)
HDL: 35.8 mg/dL — ABNORMAL LOW (ref >39.00)
LDL Cholesterol: 100 mg/dL — ABNORMAL HIGH (ref 0–99)
NonHDL: 115.03
Total CHOL/HDL Ratio: 4
Triglycerides: 76 mg/dL (ref 0.0–149.0)
VLDL: 15.2 mg/dL (ref 0.0–40.0)

## 2018-09-24 LAB — PSA: PSA: 1.94 ng/mL (ref 0.10–4.00)

## 2018-09-24 LAB — COMPREHENSIVE METABOLIC PANEL
ALT: 12 U/L (ref 0–53)
AST: 13 U/L (ref 0–37)
Albumin: 4.3 g/dL (ref 3.5–5.2)
Alkaline Phosphatase: 43 U/L (ref 39–117)
BUN: 17 mg/dL (ref 6–23)
CO2: 27 mEq/L (ref 19–32)
Calcium: 9 mg/dL (ref 8.4–10.5)
Chloride: 108 mEq/L (ref 96–112)
Creatinine, Ser: 1.11 mg/dL (ref 0.40–1.50)
GFR: 65.38 mL/min (ref >60.00)
Glucose, Bld: 77 mg/dL (ref 70–99)
Potassium: 4 mEq/L (ref 3.5–5.1)
Sodium: 142 mEq/L (ref 135–145)
Total Bilirubin: 0.8 mg/dL (ref 0.2–1.2)
Total Protein: 6.2 g/dL (ref 6.0–8.3)

## 2018-09-24 LAB — TSH: TSH: 2.93 u[IU]/mL (ref 0.35–4.50)

## 2018-09-24 MED ORDER — FLUOXETINE HCL 20 MG PO CAPS: ORAL_CAPSULE | ORAL | 1 refills | Status: DC

## 2018-09-24 MED ORDER — TADALAFIL 5 MG PO TABS: 5.0000 mg | ORAL_TABLET | Freq: Every day | ORAL | 1 refills | Status: DC | PRN

## 2018-09-24 MED ORDER — TAMSULOSIN HCL 0.4 MG PO CAPS: 0.4000 mg | ORAL_CAPSULE | Freq: Every day | ORAL | 3 refills | Status: DC

## 2018-09-24 MED ORDER — ZOLPIDEM TARTRATE 10 MG PO TABS: 10.0000 mg | ORAL_TABLET | Freq: Every evening | ORAL | 2 refills | Status: DC | PRN

## 2018-09-24 MED ORDER — TADALAFIL 5 MG PO TABS: 5.0000 mg | ORAL_TABLET | Freq: Every day | ORAL | 1 refills | Status: DC

## 2018-09-24 NOTE — Patient Instructions (Signed)
It was great seeing you today   I have sent in your medications  Please let me know if insurance covers Cialis   Someone from cardiology will call you to schedule an appointment    You can try using Prilosec for the next two weeks to see if this cuts down on gas

## 2018-09-24 NOTE — Progress Notes (Addendum)
Subjective:    Patient ID: Harold CellarJames B Trevathan Jr., male    DOB: 1945/09/25, 73 y.o.   MRN: [id number]  HPI Patient presents for yearly preventative medicine examination. He is a pleasant 73 year old male who  has a past medical history of BENIGN PROSTATIC HYPERTROPHY (02/06/2007), COLONIC POLYPS, HX OF (02/06/2007), DIVERTICULOSIS, COLON (02/06/2007), ERECTILE DYSFUNCTION, NON-ORGANIC, MILD (02/23/2009), HYPERLIPIDEMIA (02/22/2008), and OSTEOARTHRITIS (02/06/2007).  Insomnia-continues to be controlled with Ambien 10 mg nightly.  He denies side effects  Anxiety-well controlled with Prozac 20 mg daily  BPH- takes Flomax and feels better controlled, but still getting up multiple times a night to urinate   Hyperlipidemia-is diet controlled Lab Results  Component Value Date   CHOL 134 09/16/2017   HDL 39.90 09/16/2017   LDLCALC 83 09/16/2017   TRIG 56.0 09/16/2017   CHOLHDL 3 09/16/2017   Left sided chest pain-was last seen for this issue back in January 2020.  At this time he presented with a complaint of constant left-sided chest pain.  Pain was nonradiating was felt as "soreness".  About the same time he just developed the chest pain he also developed dry cough.  His EKG was normal and his chest x-ray was negative for any acute findings.  He was prescribed a course of prednisone to help with his cough and was thought that his left-sided chest pain was more muscular pain rather than cardiac.  Today he reports that he continues to have intermittent episodes of left-sided chest pain.  Pain is not radiating.  Unable to describe what the pain feels like at this time.  Pain only lasts for a few seconds and then resolves.  He does not feel as though he has the pain on a daily basis.  He does endorse shortness of breath that is with exertion only, this is somewhat new for him.  He has not noticed any lower extremity edema, or pain radiating up his left jaw or down his left arm.  ED- Takes Viagra PRN but  does not feel as though this works well for him   All immunizations and health maintenance protocols were reviewed with the patient and needed orders were placed. Vaccinations are up to date   Appropriate screening laboratory values were ordered for the patient including screening of hyperlipidemia, renal function and hepatic function. If indicated by BPH, a PSA was ordered.  Medication reconciliation,  past medical history, social history, problem list and allergies were reviewed in detail with the patient  Goals were established with regard to weight loss, exercise, and  diet in compliance with medications.   End of life planning was discussed.  He is up-to-date on routine screening such as colonoscopy, dental and vision screens.    Review of Systems  Constitutional: Negative.   HENT: Positive for hearing loss.   Eyes: Negative.   Respiratory: Positive for shortness of breath.   Cardiovascular: Positive for chest pain. Negative for leg swelling.  Gastrointestinal: Negative.   Endocrine: Negative.   Genitourinary: Negative.   Musculoskeletal: Negative.   Skin: Negative.   Allergic/Immunologic: Negative.   Neurological: Negative.   Hematological: Negative.   Psychiatric/Behavioral: Negative.   All other systems reviewed and are negative.  Past Medical History:  Diagnosis Date  . BENIGN PROSTATIC HYPERTROPHY 02/06/2007  . COLONIC POLYPS, HX OF 02/06/2007  . DIVERTICULOSIS, COLON 02/06/2007  . ERECTILE DYSFUNCTION, NON-ORGANIC, MILD 02/23/2009  . HYPERLIPIDEMIA 02/22/2008  . OSTEOARTHRITIS 02/06/2007    Social History   Socioeconomic History  .  Marital status: Married    Spouse name: Not on file  . Number of children: Not on file  . Years of education: Not on file  . Highest education level: Not on file  Occupational History  . Not on file  Social Needs  . Financial resource strain: Not on file  . Food insecurity    Worry: Not on file    Inability: Not on file  .  Transportation needs    Medical: Not on file    Non-medical: Not on file  Tobacco Use  . Smoking status: Never Smoker  . Smokeless tobacco: Never Used  Substance and Sexual Activity  . Alcohol use: Yes    Alcohol/week: 1.0 standard drinks    Types: 1 Cans of beer per week  . Drug use: No  . Sexual activity: Not on file  Lifestyle  . Physical activity    Days per week: Not on file    Minutes per session: Not on file  . Stress: Not on file  Relationships  . Social Herbalist on phone: Not on file    Gets together: Not on file    Attends religious service: Not on file    Active member of club or organization: Not on file    Attends meetings of clubs or organizations: Not on file    Relationship status: Not on file  . Intimate partner violence    Fear of current or ex partner: Not on file    Emotionally abused: Not on file    Physically abused: Not on file    Forced sexual activity: Not on file  Other Topics Concern  . Not on file  Social History Narrative   Retired    Married     Past Surgical History:  Procedure Laterality Date  . CERVICAL SPINE SURGERY    . ROTATOR CUFF REPAIR    . TEMPOROMANDIBULAR JOINT SURGERY    . TONSILLECTOMY    . VASECTOMY      Family History  Problem Relation Age of Onset  . COPD Mother   . Heart disease Mother   . Cancer Father        lung ca    Allergies  Allergen Reactions  . Nickel Rash  . Sulfacetamide Sodium     Dryness of mouth     Current Outpatient Medications on File Prior to Visit  Medication Sig Dispense Refill  . FLUoxetine (PROZAC) 20 MG capsule TAKE 1 BY MOUTH DAILY 90 capsule 3  . fluticasone (FLONASE) 50 MCG/ACT nasal spray Place 2 sprays into both nostrils daily. 48 g 3  . naproxen (NAPROXEN DR) 500 MG EC tablet Take 1 tablet (500 mg total) by mouth 2 (two) times daily with a meal. 180 tablet 3  . niacin (NIASPAN) 1000 MG CR tablet Take 1 tablet (1,000 mg total) by mouth at bedtime. 90 tablet 3  .  sildenafil (REVATIO) 20 MG tablet TAKE TWO TABLETS BY MOUTH DAILY AS DIRECTED 150 tablet 1  . tamsulosin (FLOMAX) 0.4 MG CAPS capsule Take 1 capsule (0.4 mg total) by mouth daily. 90 capsule 3  . zolpidem (AMBIEN) 10 MG tablet TAKE ONE TABLET BY MOUTH EVERY NIGHT AT BEDTIME AS NEEDED 30 tablet 2   No current facility-administered medications on file prior to visit.     BP 136/80   Temp 98.4 F (36.9 C)   Ht 5\' 9"  (1.753 m)   Wt 187 lb (84.8 kg)   BMI 27.62  kg/m       Objective:   Physical Exam Vitals signs and nursing note reviewed.  Constitutional:      General: He is not in acute distress.    Appearance: Normal appearance. He is not diaphoretic.  HENT:     Head: Normocephalic and atraumatic.     Right Ear: Tympanic membrane, ear canal and external ear normal. There is no impacted cerumen.     Left Ear: Tympanic membrane, ear canal and external ear normal. There is no impacted cerumen.     Nose: Nose normal. No congestion.     Mouth/Throat:     Mouth: Mucous membranes are moist.     Pharynx: Oropharynx is clear. No oropharyngeal exudate.  Eyes:     General: No scleral icterus.       Right eye: No discharge.        Left eye: No discharge.     Extraocular Movements: Extraocular movements intact.     Conjunctiva/sclera: Conjunctivae normal.     Pupils: Pupils are equal, round, and reactive to light.  Neck:     Musculoskeletal: Normal range of motion and neck supple.     Thyroid: No thyromegaly.     Vascular: No carotid bruit or JVD.     Trachea: No tracheal deviation.  Cardiovascular:     Rate and Rhythm: Normal rate and regular rhythm.     Pulses: Normal pulses.     Heart sounds: Normal heart sounds. No murmur. No friction rub. No gallop.   Pulmonary:     Effort: Pulmonary effort is normal. No respiratory distress.     Breath sounds: Normal breath sounds. No stridor. No wheezing, rhonchi or rales.  Chest:     Chest wall: No tenderness.  Abdominal:     General:  Bowel sounds are normal. There is no distension.     Palpations: Abdomen is soft. There is no mass.     Tenderness: There is no abdominal tenderness. There is no right CVA tenderness, left CVA tenderness, guarding or rebound.     Hernia: No hernia is present.  Musculoskeletal: Normal range of motion.        General: No swelling, tenderness, deformity or signs of injury.     Right lower leg: No edema.     Left lower leg: No edema.     Comments: No reproducible left chest wall pain  Lymphadenopathy:     Cervical: No cervical adenopathy.  Skin:    General: Skin is warm and dry.     Capillary Refill: Capillary refill takes less than 2 seconds.     Coloration: Skin is not jaundiced or pale.     Findings: No bruising, erythema, lesion or rash.  Neurological:     General: No focal deficit present.     Mental Status: He is alert and oriented to person, place, and time.     Cranial Nerves: No cranial nerve deficit.     Motor: No abnormal muscle tone.     Coordination: Coordination normal.     Deep Tendon Reflexes: Reflexes are normal and symmetric. Reflexes normal.  Psychiatric:        Mood and Affect: Mood normal.        Behavior: Behavior normal.        Thought Content: Thought content normal.        Judgment: Judgment normal.       Assessment & Plan:  1. Encounter for preventive health examination  - CBC with  Differential/Platelet - Comprehensive metabolic panel - Lipid panel - TSH  2. Dyslipidemia - Consider statin  - CBC with Differential/Platelet - Comprehensive metabolic panel - Lipid panel - TSH  3. Primary insomnia  - zolpidem (AMBIEN) 10 MG tablet; Take 1 tablet (10 mg total) by mouth at bedtime as needed.  Dispense: 30 tablet; Refill: 2  4. Anxiety - Well controlled. No change in medications  - FLUoxetine (PROZAC) 20 MG capsule; TAKE 1 BY MOUTH DAILY  Dispense: 90 capsule; Refill: 1  5. Chest pain, unspecified type -Concern for cardiac issue due to timeframe.   Will send to cardiology to be evaluated for possible echo and stress test. - Ambulatory referral to Cardiology  6. ERECTILE DYSFUNCTION, NON-ORGANIC, MILD - Will try and get Cialis covered by insurance - CBC with Differential/Platelet - Comprehensive metabolic panel - Lipid panel - TSH - PSA - tadalafil (CIALIS) 5 MG tablet; Take 1 tablet (5 mg total) by mouth daily.  Dispense: 30 tablet; Refill: 1  7. Benign prostatic hyperplasia with nocturia  - PSA - tamsulosin (FLOMAX) 0.4 MG CAPS capsule; Take 1 capsule (0.4 mg total) by mouth daily.  Dispense: 90 capsule; Refill: 3 - tadalafil (CIALIS) 5 MG tablet; Take 1 tablet (5 mg total) by mouth daily.  Dispense: 30 tablet; Refill: 1   Shirline Freesory Codie Krogh, NP

## 2018-09-28 ENCOUNTER — Telehealth: Payer: Self-pay | Admitting: Adult Health

## 2018-09-28 DIAGNOSIS — R351 Nocturia: Secondary | ICD-10-CM

## 2018-09-28 DIAGNOSIS — F419 Anxiety disorder, unspecified: Secondary | ICD-10-CM

## 2018-09-28 DIAGNOSIS — N401 Enlarged prostate with lower urinary tract symptoms: Secondary | ICD-10-CM

## 2018-09-28 NOTE — Telephone Encounter (Signed)
FLUoxetine fluticasone naproxen niacin tamsulosin   Pt called and stated that all above medication should have been sent to   [facility]) - [place], [place] [phone] (Phone) [phone] (Fax)    Please advise

## 2018-09-29 MED ORDER — NAPROXEN 500 MG PO TBEC: 500.0000 mg | DELAYED_RELEASE_TABLET | Freq: Two times a day (BID) | ORAL | 1 refills | Status: DC

## 2018-09-29 MED ORDER — TAMSULOSIN HCL 0.4 MG PO CAPS: 0.4000 mg | ORAL_CAPSULE | Freq: Every day | ORAL | 3 refills | Status: DC

## 2018-09-29 MED ORDER — FLUOXETINE HCL 20 MG PO CAPS: ORAL_CAPSULE | ORAL | 1 refills | Status: DC

## 2018-09-29 MED ORDER — NIACIN ER (ANTIHYPERLIPIDEMIC) 1000 MG PO TBCR: 1000.0000 mg | EXTENDED_RELEASE_TABLET | Freq: Every day | ORAL | 3 refills | Status: DC

## 2018-09-29 NOTE — Telephone Encounter (Signed)
Sent to the pharmacy by e-scribe. 

## 2018-10-05 ENCOUNTER — Encounter: Payer: Self-pay | Admitting: Adult Health

## 2018-10-06 MED ORDER — FLUTICASONE PROPIONATE 50 MCG/ACT NA SUSP: 2.0000 | Freq: Every day | NASAL | 3 refills | Status: DC

## 2018-10-06 NOTE — Telephone Encounter (Signed)
Both have been sent to the pharmacy by e-scribe.

## 2018-10-09 DIAGNOSIS — H903 Sensorineural hearing loss, bilateral: Secondary | ICD-10-CM | POA: Diagnosis not present

## 2018-10-13 ENCOUNTER — Other Ambulatory Visit: Payer: Self-pay | Admitting: *Deleted

## 2018-10-13 DIAGNOSIS — Z1159 Encounter for screening for other viral diseases: Secondary | ICD-10-CM

## 2018-10-14 ENCOUNTER — Other Ambulatory Visit (INDEPENDENT_AMBULATORY_CARE_PROVIDER_SITE_OTHER): Payer: PPO

## 2018-10-14 ENCOUNTER — Other Ambulatory Visit: Payer: Self-pay

## 2018-10-14 DIAGNOSIS — Z1159 Encounter for screening for other viral diseases: Secondary | ICD-10-CM | POA: Diagnosis not present

## 2018-10-15 LAB — HEPATITIS C ANTIBODY
Hepatitis C Ab: NONREACTIVE
SIGNAL TO CUT-OFF: 0.01 (ref <1.00)

## 2018-10-22 DIAGNOSIS — H903 Sensorineural hearing loss, bilateral: Secondary | ICD-10-CM | POA: Diagnosis not present

## 2018-10-29 ENCOUNTER — Other Ambulatory Visit: Payer: Self-pay | Admitting: Family Medicine

## 2018-10-29 MED ORDER — CYCLOBENZAPRINE HCL 5 MG PO TABS: 5.0000 mg | ORAL_TABLET | Freq: Three times a day (TID) | ORAL | 4 refills | Status: DC | PRN

## 2018-11-17 ENCOUNTER — Encounter: Payer: Self-pay | Admitting: Adult Health

## 2018-11-20 ENCOUNTER — Other Ambulatory Visit: Payer: Self-pay | Admitting: Adult Health

## 2018-11-20 DIAGNOSIS — F528 Other sexual dysfunction not due to a substance or known physiological condition: Secondary | ICD-10-CM

## 2018-11-20 DIAGNOSIS — N401 Enlarged prostate with lower urinary tract symptoms: Secondary | ICD-10-CM

## 2018-11-24 NOTE — Telephone Encounter (Signed)
Sent to the pharmacy by e-scribe. 

## 2018-12-21 ENCOUNTER — Other Ambulatory Visit: Payer: Self-pay | Admitting: Adult Health

## 2018-12-21 ENCOUNTER — Encounter: Payer: Self-pay | Admitting: Adult Health

## 2018-12-21 DIAGNOSIS — F5101 Primary insomnia: Secondary | ICD-10-CM

## 2018-12-22 ENCOUNTER — Other Ambulatory Visit: Payer: Self-pay | Admitting: Adult Health

## 2018-12-22 DIAGNOSIS — F5101 Primary insomnia: Secondary | ICD-10-CM

## 2018-12-22 MED ORDER — ZOLPIDEM TARTRATE 10 MG PO TABS: 10.0000 mg | ORAL_TABLET | Freq: Every evening | ORAL | 2 refills | Status: DC | PRN

## 2018-12-22 NOTE — Telephone Encounter (Signed)
Sent to local pharmacy.

## 2018-12-22 NOTE — Telephone Encounter (Signed)
Last filled 09/24/18 Last OV 09/24/2018  Ok to fill?

## 2018-12-23 ENCOUNTER — Other Ambulatory Visit: Payer: Self-pay | Admitting: Adult Health

## 2019-01-01 ENCOUNTER — Ambulatory Visit: Payer: Self-pay

## 2019-01-01 NOTE — Telephone Encounter (Signed)
Incoming call from Patient Who complains of elevated blood pressure .  Patient reports that he was at the dentist office and they took his blood pressure  And got 184/108, 187//109 and 185/104 automatic machine.  Denies hx of high blood pressure.  States that he was a little nervous when he sat down in the dentist chair. Reviewed protoclo with Patient.  Recommended that Pt.  Go to Ed or Urgent care.   Pt. Voiced understanding.  Will go to Urgent care.         Reason for Disposition . [4] Systolic BP  >= 158 OR Diastolic >= 309 AND [4] cardiac or neurologic symptoms (e.g., chest pain, difficulty breathing, unsteady gait, blurred vision)  Answer Assessment - Initial Assessment Questions 1. BLOOD PRESSURE: "What is the blood pressure?" "Did you take at least two measurements 5 minutes apart?"   184/108, 187/109185/104 2. ONSET: "When did you take your blood pressure?"     today 3. HOW: "How did you obtain the blood pressure?" (e.g., visiting nurse, automatic home BP monitor)     automatic 4. HISTORY: "Do you have a history of high blood pressure?"     denies 5. MEDICATIONS: "Are you taking any medications for blood pressure?" "Have you missed any doses recently?"   deniues 6. OTHER SYMPTOMS: "Do you have any symptoms?" (e.g., headache, chest pain, blurred vision, difficulty breathing, weakness)     Denies 7. PREGNANCY: "Is there any chance you are pregnant?" "When was your last menstrual period?"     na  Protocols used: HIGH BLOOD PRESSURE-A-AH

## 2019-01-01 NOTE — Telephone Encounter (Signed)
Will hold to see if pt goes to UC.

## 2019-01-01 NOTE — Telephone Encounter (Signed)
Pt states he got home and checked his BP.  Systolic was 284X and then came down to 130s.  Pt says he is feeling fine.  Denied headache, chest pain, blurred vision.  Did say he has fatigue but that is normal.  He also mentioned that his bp is usually high when he comes here but then comes down.  Informed him that I would forward to [facility] as FYI and call if anything needed to be done.  Pt agreed to plan.  Will route.

## 2019-01-02 ENCOUNTER — Encounter (HOSPITAL_COMMUNITY): Payer: Self-pay

## 2019-01-02 ENCOUNTER — Ambulatory Visit (HOSPITAL_COMMUNITY)
Admission: EM | Admit: 2019-01-02 | Discharge: 2019-01-02 | Disposition: A | Discharge status: Home / Self Care | Payer: PPO | Attending: Internal Medicine | Admitting: Internal Medicine

## 2019-01-02 DIAGNOSIS — R030 Elevated blood-pressure reading, without diagnosis of hypertension: Secondary | ICD-10-CM

## 2019-01-02 NOTE — ED Provider Notes (Signed)
[facility]    CSN: [id number] Arrival date & time: 01/02/19  1103      History   Chief Complaint Chief Complaint  Patient presents with  . Blood Pressure Check    HPI Harold Wilson. is a 73 y.o. male comes to urgent care to have blood pressure checked.  Patient is not a known hypertensive.  He went to his dentist office and was found to have elevated blood pressure resulting in cancellation of his scheduled elective procedure.  At home patient has been checking his blood pressure and his systolic blood pressure has been greater than 140.  Patient denies any headaches.  No numbness or tingling.  No shortness of breath, chest pain or abdominal pain.   HPI  Past Medical History:  Diagnosis Date  . BENIGN PROSTATIC HYPERTROPHY 02/06/2007  . COLONIC POLYPS, HX OF 02/06/2007  . DIVERTICULOSIS, COLON 02/06/2007  . ERECTILE DYSFUNCTION, NON-ORGANIC, MILD 02/23/2009  . HYPERLIPIDEMIA 02/22/2008  . OSTEOARTHRITIS 02/06/2007    Patient Active Problem List   Diagnosis Date Noted  . Insomnia 06/02/2012  . ADHD (attention deficit hyperactivity disorder) 11/11/2011  . ERECTILE DYSFUNCTION, NON-ORGANIC, MILD 02/23/2009  . Dyslipidemia 02/22/2008  . DIVERTICULOSIS, COLON 02/06/2007  . BPH (benign prostatic hyperplasia) 02/06/2007  . Osteoarthritis 02/06/2007  . History of colonic polyps 02/06/2007    Past Surgical History:  Procedure Laterality Date  . CERVICAL SPINE SURGERY    . ROTATOR CUFF REPAIR    . TEMPOROMANDIBULAR JOINT SURGERY    . TONSILLECTOMY    . VASECTOMY         Home Medications    Prior to Admission medications   Medication Sig Start Date End Date Taking? Authorizing Provider  cyclobenzaprine (FLEXERIL) 5 MG tablet Take 1 tablet (5 mg total) by mouth 3 (three) times daily as needed for muscle spasms. 10/29/18   Nafziger, Kandee Keen, NP  FLUoxetine (PROZAC) 20 MG capsule TAKE 1 BY MOUTH DAILY 09/29/18   Nafziger, Kandee Keen, NP  fluticasone (FLONASE) 50  MCG/ACT nasal spray Place 2 sprays into both nostrils daily. 10/06/18   Nafziger, Kandee Keen, NP  naproxen (NAPROXEN DR) 500 MG EC tablet Take 1 tablet (500 mg total) by mouth 2 (two) times daily with a meal. 09/29/18   Nafziger, Kandee Keen, NP  niacin (NIASPAN) 1000 MG CR tablet Take 1 tablet (1,000 mg total) by mouth at bedtime. 09/29/18   Nafziger, Kandee Keen, NP  sildenafil (REVATIO) 20 MG tablet TAKE TWO TABLETS BY MOUTH DAILY AS DIRECTED 12/24/18   Nafziger, Kandee Keen, NP  tamsulosin (FLOMAX) 0.4 MG CAPS capsule Take 1 capsule (0.4 mg total) by mouth daily. 09/29/18   Nafziger, Kandee Keen, NP  zolpidem (AMBIEN) 10 MG tablet TAKE ONE TABLET BY MOUTH EVERY NIGHT AT BEDTIME AS NEEDED 12/22/18   Nafziger, Kandee Keen, NP  zolpidem (AMBIEN) 10 MG tablet Take 1 tablet (10 mg total) by mouth at bedtime as needed. 12/22/18   [name], NP    Family History Family History  Problem Relation Age of Onset  . COPD Mother   . Heart disease Mother   . Cancer Father        lung ca    Social History Social History   Tobacco Use  . Smoking status: Never Smoker  . Smokeless tobacco: Never Used  Substance Use Topics  . Alcohol use: Yes    Alcohol/week: 1.0 standard drinks    Types: 1 Cans of beer per week  . Drug use: No     Allergies  Nickel and Sulfacetamide sodium   Review of Systems Review of Systems  Constitutional: Negative.   HENT: Negative.   Cardiovascular: Negative.   Gastrointestinal: Negative.   Musculoskeletal: Negative.      Physical Exam Triage Vital Signs ED Triage Vitals  Enc Vitals Group     BP 01/02/19 1134 (!) 156/87     Pulse Rate 01/02/19 1134 83     Resp 01/02/19 1134 16     Temp 01/02/19 1134 98.1 F (36.7 C)     Temp Source 01/02/19 1134 Oral     SpO2 01/02/19 1134 97 %     Weight --      Height --      Head Circumference --      Peak Flow --      Pain Score 01/02/19 1132 0     Pain Loc --      Pain Edu? --      Excl. in [place]? --    No data found.  Updated Vital Signs BP  (!) 156/87 (BP Location: Left Arm)   Pulse 83   Temp 98.1 F (36.7 C) (Oral)   Resp 16   SpO2 97%   Visual Acuity Right Eye Distance:   Left Eye Distance:   Bilateral Distance:    Right Eye Near:   Left Eye Near:    Bilateral Near:     Physical Exam Constitutional:      General: He is not in acute distress.    Appearance: Normal appearance. He is not ill-appearing.  Cardiovascular:     Rate and Rhythm: Normal rate and regular rhythm.     Pulses: Normal pulses.     Heart sounds: Normal heart sounds. No murmur. No friction rub. No gallop.   Pulmonary:     Effort: Pulmonary effort is normal.     Breath sounds: Normal breath sounds.  Abdominal:     General: Abdomen is flat. Bowel sounds are normal.  Skin:    Capillary Refill: Capillary refill takes less than 2 seconds.  Neurological:     General: No focal deficit present.     Mental Status: He is alert and oriented to person, place, and time.      UC Treatments / Results  Labs (all labs ordered are listed, but only abnormal results are displayed) Labs Reviewed - No data to display  EKG   Radiology No results found.  Procedures Procedures (including critical care time)  Medications Ordered in UC Medications - No data to display  Initial Impression / Assessment and Plan / UC Course  I have reviewed the triage vital signs and the nursing notes.  Pertinent labs & imaging results that were available during my care of the patient were reviewed by me and considered in my medical decision making (see chart for details).     1.  Elevated blood pressure without the diagnosis of hypertension: Patient is advised to continue monitoring his blood pressure at home He will follow-up with the primary care physician in 10 in his blood pressure logs Primary care will make a determination whether patient will require antihypertensive agents or not. Final Clinical Impressions(s) / UC Diagnoses   Final diagnoses:  None    Discharge Instructions   None    ED Prescriptions    None     PDMP not reviewed this encounter.   Chase Picket, MD 01/02/19 1420

## 2019-01-02 NOTE — ED Triage Notes (Signed)
Pt states his blood pressure at the dentist yesterday and his  PCP told him to go to the U.  BP at the dentist office 187/109, 184/108, 185/104  BP checked by the pt at his house yesterday: 150/86, 145/91 BP checked by the pt at his house today: 170/97, 166/99, 167/99  Pt denies any symptoms at this moment.

## 2019-01-11 ENCOUNTER — Encounter: Payer: Self-pay | Admitting: Adult Health

## 2019-01-21 ENCOUNTER — Other Ambulatory Visit: Payer: Self-pay

## 2019-01-21 ENCOUNTER — Other Ambulatory Visit: Payer: Self-pay | Admitting: Adult Health

## 2019-01-21 ENCOUNTER — Telehealth (INDEPENDENT_AMBULATORY_CARE_PROVIDER_SITE_OTHER): Payer: PPO | Admitting: Adult Health

## 2019-01-21 DIAGNOSIS — I10 Essential (primary) hypertension: Secondary | ICD-10-CM | POA: Diagnosis not present

## 2019-01-21 MED ORDER — LISINOPRIL 5 MG PO TABS: 5.0000 mg | ORAL_TABLET | Freq: Every day | ORAL | 0 refills | Status: DC

## 2019-01-21 NOTE — Progress Notes (Signed)
Virtual Visit via Video Note  I connected with Harold Wilson  on 01/21/19 at  2:00 PM EST by a video enabled telemedicine application and verified that I am speaking with the correct person using two identifiers.  Location patient: home Location provider:work or home office Persons participating in the virtual visit: patient, provider  I discussed the limitations of evaluation and management by telemedicine and the availability of in person appointments. The patient expressed understanding and agreed to proceed.   HPI: He is being evaluated today for concern of elevated blood pressure readings.  He has no known history of hypertension.  Was at the dentist recently and had a blood pressure reading of 540 systolic.  He then presented to urgent care on 01/02/2019 to have his blood pressure checked and there is 156/87.  He has been monitoring his blood pressures at home and reports readings between 140 and 160/80-102.  At times he has a mild headache.  Denies blurred vision.  He has not started on any new supplements or medications.  His diet has not changed.   ROS: See pertinent positives and negatives per HPI.  Past Medical History:  Diagnosis Date  . BENIGN PROSTATIC HYPERTROPHY 02/06/2007  . COLONIC POLYPS, HX OF 02/06/2007  . DIVERTICULOSIS, COLON 02/06/2007  . ERECTILE DYSFUNCTION, NON-ORGANIC, MILD 02/23/2009  . HYPERLIPIDEMIA 02/22/2008  . OSTEOARTHRITIS 02/06/2007    Past Surgical History:  Procedure Laterality Date  . CERVICAL SPINE SURGERY    . ROTATOR CUFF REPAIR    . TEMPOROMANDIBULAR JOINT SURGERY    . TONSILLECTOMY    . VASECTOMY      Family History  Problem Relation Age of Onset  . COPD Mother   . Heart disease Mother   . Cancer Father        lung ca     Current Outpatient Medications:  .  cyclobenzaprine (FLEXERIL) 5 MG tablet, Take 1 tablet (5 mg total) by mouth 3 (three) times daily as needed for muscle spasms., Disp: 30 tablet, Rfl: 4 .  FLUoxetine (PROZAC)  20 MG capsule, TAKE 1 BY MOUTH DAILY, Disp: 90 capsule, Rfl: 1 .  fluticasone (FLONASE) 50 MCG/ACT nasal spray, Place 2 sprays into both nostrils daily., Disp: 48 g, Rfl: 3 .  naproxen (NAPROXEN DR) 500 MG EC tablet, Take 1 tablet (500 mg total) by mouth 2 (two) times daily with a meal., Disp: 180 tablet, Rfl: 1 .  niacin (NIASPAN) 1000 MG CR tablet, Take 1 tablet (1,000 mg total) by mouth at bedtime., Disp: 90 tablet, Rfl: 3 .  sildenafil (REVATIO) 20 MG tablet, TAKE TWO TABLETS BY MOUTH DAILY AS DIRECTED, Disp: 150 tablet, Rfl: 0 .  tamsulosin (FLOMAX) 0.4 MG CAPS capsule, Take 1 capsule (0.4 mg total) by mouth daily., Disp: 90 capsule, Rfl: 3 .  zolpidem (AMBIEN) 10 MG tablet, TAKE ONE TABLET BY MOUTH EVERY NIGHT AT BEDTIME AS NEEDED, Disp: 30 tablet, Rfl: 2 .  zolpidem (AMBIEN) 10 MG tablet, Take 1 tablet (10 mg total) by mouth at bedtime as needed., Disp: 30 tablet, Rfl: 2  EXAM:  VITALS per patient if applicable:  GENERAL: alert, oriented, appears well and in no acute distress  HEENT: atraumatic, conjunttiva clear, no obvious abnormalities on inspection of external nose and ears  NECK: normal movements of the head and neck  LUNGS: on inspection no signs of respiratory distress, breathing rate appears normal, no obvious gross SOB, gasping or wheezing  CV: no obvious cyanosis  MS: moves all visible extremities  without noticeable abnormality  PSYCH/NEURO: pleasant and cooperative, no obvious depression or anxiety, speech and thought processing grossly intact  ASSESSMENT AND PLAN:  Discussed the following assessment and plan:  1. Essential hypertension -We will have him start on lisinopril 5 mg.  Continue to monitor blood pressure at home and follow-up in 2 weeks.  Side effects reviewed with the patient. - lisinopril (ZESTRIL) 5 MG tablet; Take 1 tablet (5 mg total) by mouth daily.  Dispense: 30 tablet; Refill: 0     I discussed the assessment and treatment plan with the  patient. The patient was provided an opportunity to ask questions and all were answered. The patient agreed with the plan and demonstrated an understanding of the instructions.   The patient was advised to call back or seek an in-person evaluation if the symptoms worsen or if the condition fails to improve as anticipated.   [name], NP

## 2019-01-25 NOTE — Telephone Encounter (Signed)
Sent to the pharmacy by e-scribe. 

## 2019-02-11 ENCOUNTER — Ambulatory Visit: Payer: Self-pay | Admitting: *Deleted

## 2019-02-11 NOTE — Telephone Encounter (Signed)
Pt and his wife called stating his BP is fluctuating; it was 147/86 at 1300 left upper arm,and 166/99 on 02/10/2019; on 12/7/it was 168/97 02/08/2019 and 132/75 on 02/07/2019; he had a headache on 12/9; he does not have a headache now; he takes lisinopril and has not missed doses; the pt denies SOB, dizziness, or chest pain; he does have continued chest tightness, and has been evaluated for this; recommendations made per nurse triage protocol; the pt sees [facility], [facility]; pt and his wife transferred to [facility] for scheduling.    Reason for Disposition . Systolic BP  >= 765 OR Diastolic >= 465  Answer Assessment - Initial Assessment Questions 1. BLOOD PRESSURE: "What is the blood pressure?" "Did you take at least two measurements 5 minutes apart?"     147/86 and 166/99 2. ONSET: "When did you take your blood pressure?"  02/11/2019 and 02/10/2019 3. HOW: "How did you obtain the blood pressure?" (e.g., visiting nurse, automatic home BP monitor)     Home cuff left upper arm 4. HISTORY: "Do you have a history of high blood pressure?"     yes 5. MEDICATIONS: "Are you taking any medications for blood pressure?" "Have you missed any doses recently?"    Yes, no missed doses 6. OTHER SYMPTOMS: "Do you have any symptoms?" (e.g., headache, chest pain, blurred vision, difficulty breathing, weakness)     no 7. PREGNANCY: "Is there any chance you are pregnant?" "When was your last menstrual period?"     no  Protocols used: HIGH BLOOD PRESSURE-A-AH

## 2019-02-12 ENCOUNTER — Telehealth (INDEPENDENT_AMBULATORY_CARE_PROVIDER_SITE_OTHER): Payer: PPO | Admitting: Adult Health

## 2019-02-12 ENCOUNTER — Other Ambulatory Visit: Payer: Self-pay

## 2019-02-12 DIAGNOSIS — I10 Essential (primary) hypertension: Secondary | ICD-10-CM

## 2019-02-12 MED ORDER — LISINOPRIL 20 MG PO TABS: 20.0000 mg | ORAL_TABLET | Freq: Every day | ORAL | 0 refills | Status: DC

## 2019-02-12 NOTE — Progress Notes (Signed)
Virtual Visit via Video Note  I connected with Harold Wilson  on 02/12/19 at  1:30 PM EST by a video enabled telemedicine application and verified that I am speaking with the correct person using two identifiers.  Location patient: home Location provider:work or home office Persons participating in the virtual visit: patient, provider  I discussed the limitations of evaluation and management by telemedicine and the availability of in person appointments. The patient expressed understanding and agreed to proceed.   HPI: He is being evaluated today for follow-up regarding hypertension.  About a month ago he was started on lisinopril 5 mg for elevated blood pressure readings.  He has been taking his medication every day and reports readings between 1 47-1 68 over 80s to 90s.  At times he does have a mild headache but this is intermittently.  He denies shortness of breath, dizziness, or chest pain.   ROS: See pertinent positives and negatives per HPI.  Past Medical History:  Diagnosis Date  . BENIGN PROSTATIC HYPERTROPHY 02/06/2007  . COLONIC POLYPS, HX OF 02/06/2007  . DIVERTICULOSIS, COLON 02/06/2007  . ERECTILE DYSFUNCTION, NON-ORGANIC, MILD 02/23/2009  . HYPERLIPIDEMIA 02/22/2008  . OSTEOARTHRITIS 02/06/2007    Past Surgical History:  Procedure Laterality Date  . CERVICAL SPINE SURGERY    . ROTATOR CUFF REPAIR    . TEMPOROMANDIBULAR JOINT SURGERY    . TONSILLECTOMY    . VASECTOMY      Family History  Problem Relation Age of Onset  . COPD Mother   . Heart disease Mother   . Cancer Father        lung ca     Current Outpatient Medications:  .  cyclobenzaprine (FLEXERIL) 5 MG tablet, Take 1 tablet (5 mg total) by mouth 3 (three) times daily as needed for muscle spasms., Disp: 30 tablet, Rfl: 4 .  FLUoxetine (PROZAC) 20 MG capsule, TAKE 1 BY MOUTH DAILY, Disp: 90 capsule, Rfl: 1 .  fluticasone (FLONASE) 50 MCG/ACT nasal spray, Place 2 sprays into both nostrils daily., Disp: 48  g, Rfl: 3 .  lisinopril (ZESTRIL) 5 MG tablet, Take 1 tablet (5 mg total) by mouth daily., Disp: 30 tablet, Rfl: 0 .  naproxen (NAPROXEN DR) 500 MG EC tablet, Take 1 tablet (500 mg total) by mouth 2 (two) times daily with a meal., Disp: 180 tablet, Rfl: 1 .  niacin (NIASPAN) 1000 MG CR tablet, Take 1 tablet (1,000 mg total) by mouth at bedtime., Disp: 90 tablet, Rfl: 3 .  sildenafil (REVATIO) 20 MG tablet, TAKE 2 TABLETS BY MOUTH DAILY AS DIRECTED, Disp: 150 tablet, Rfl: 1 .  tamsulosin (FLOMAX) 0.4 MG CAPS capsule, Take 1 capsule (0.4 mg total) by mouth daily., Disp: 90 capsule, Rfl: 3 .  zolpidem (AMBIEN) 10 MG tablet, TAKE ONE TABLET BY MOUTH EVERY NIGHT AT BEDTIME AS NEEDED, Disp: 30 tablet, Rfl: 2 .  zolpidem (AMBIEN) 10 MG tablet, Take 1 tablet (10 mg total) by mouth at bedtime as needed., Disp: 30 tablet, Rfl: 2  EXAM:  VITALS per patient if applicable:  GENERAL: alert, oriented, appears well and in no acute distress  HEENT: atraumatic, conjunttiva clear, no obvious abnormalities on inspection of external nose and ears  NECK: normal movements of the head and neck  LUNGS: on inspection no signs of respiratory distress, breathing rate appears normal, no obvious gross SOB, gasping or wheezing  CV: no obvious cyanosis  MS: moves all visible extremities without noticeable abnormality  PSYCH/NEURO: pleasant and cooperative, no obvious  depression or anxiety, speech and thought processing grossly intact  ASSESSMENT AND PLAN:  Discussed the following assessment and plan:  1. Essential hypertension -Not at goal.  Will increase lisinopril to 20 mg.  Continue to monitor blood pressures at home.  Follow-up in 2 weeks. - lisinopril (ZESTRIL) 20 MG tablet; Take 1 tablet (20 mg total) by mouth daily.  Dispense: 30 tablet; Refill: 0     I discussed the assessment and treatment plan with the patient. The patient was provided an opportunity to ask questions and all were answered. The patient  agreed with the plan and demonstrated an understanding of the instructions.   The patient was advised to call back or seek an in-person evaluation if the symptoms worsen or if the condition fails to improve as anticipated.   [name], NP

## 2019-02-17 ENCOUNTER — Other Ambulatory Visit: Payer: Self-pay | Admitting: Adult Health

## 2019-02-17 DIAGNOSIS — I10 Essential (primary) hypertension: Secondary | ICD-10-CM

## 2019-02-17 NOTE — Telephone Encounter (Signed)
DENIED.  PT IS TAKING THE 20 MG STRENGTH.

## 2019-03-01 ENCOUNTER — Encounter: Payer: Self-pay | Admitting: Adult Health

## 2019-03-02 ENCOUNTER — Other Ambulatory Visit: Payer: Self-pay | Admitting: Adult Health

## 2019-03-02 DIAGNOSIS — R079 Chest pain, unspecified: Secondary | ICD-10-CM

## 2019-03-03 ENCOUNTER — Encounter: Payer: Self-pay | Admitting: Adult Health

## 2019-03-04 ENCOUNTER — Other Ambulatory Visit: Payer: Self-pay | Admitting: Adult Health

## 2019-03-04 DIAGNOSIS — I10 Essential (primary) hypertension: Secondary | ICD-10-CM

## 2019-03-04 DIAGNOSIS — F5101 Primary insomnia: Secondary | ICD-10-CM

## 2019-03-04 MED ORDER — NAPROXEN 500 MG PO TBEC: 500.0000 mg | DELAYED_RELEASE_TABLET | Freq: Two times a day (BID) | ORAL | 1 refills | Status: DC

## 2019-03-04 MED ORDER — ZOLPIDEM TARTRATE 10 MG PO TABS: 10.0000 mg | ORAL_TABLET | Freq: Every evening | ORAL | 2 refills | Status: DC | PRN

## 2019-03-04 MED ORDER — LISINOPRIL 40 MG PO TABS: 40.0000 mg | ORAL_TABLET | Freq: Every day | ORAL | 0 refills | Status: DC

## 2019-03-11 ENCOUNTER — Other Ambulatory Visit: Payer: Self-pay | Admitting: Adult Health

## 2019-03-11 DIAGNOSIS — I10 Essential (primary) hypertension: Secondary | ICD-10-CM

## 2019-03-12 ENCOUNTER — Ambulatory Visit: Payer: PPO | Admitting: Cardiovascular Disease

## 2019-03-14 NOTE — Progress Notes (Signed)
Cardiology Office Note:   Date:  03/16/2019  NAME:  Harold Wilson.    MRN: [id number] DOB:  [birth year]   PCP:  Dorothyann Peng, NP  Cardiologist:  No primary care provider on file.   Referring MD: Dorothyann Peng, NP   Chief Complaint  Patient presents with  . Chest Pain   History of Present Illness:   Harold Wilson. is a 74 y.o. male with a hx of hypertension, hyperlipidemia who is being seen today for the evaluation of chest pain at the request of Dorothyann Peng, NP.  He presents for evaluation of 6 to 7 months of intermittent left-sided chest pain.  He reports he gets daily episodes of a dull pain in the left side of his chest.  It occurs at any time while sitting or with exertion.  He reports that last 2 to 3 minutes.  There are no associated symptoms of shortness of breath or palpitations.  He reports the pain resolves without any intervention or alleviating factors.  He has noticed no association with food.  His cardiovascular disease risk factors include hyperlipidemia as well as hypertension.  His blood pressure appears to be well controlled.  Cr 1.11, T Chol 151 HDL 35 LDL 100 TG 76  Past Medical History: Past Medical History:  Diagnosis Date  . BENIGN PROSTATIC HYPERTROPHY 02/06/2007  . COLONIC POLYPS, HX OF 02/06/2007  . DIVERTICULOSIS, COLON 02/06/2007  . ERECTILE DYSFUNCTION, NON-ORGANIC, MILD 02/23/2009  . HYPERLIPIDEMIA 02/22/2008  . Hypertension   . OSTEOARTHRITIS 02/06/2007    Past Surgical History: Past Surgical History:  Procedure Laterality Date  . CERVICAL SPINE SURGERY    . ROTATOR CUFF REPAIR    . TEMPOROMANDIBULAR JOINT SURGERY    . TONSILLECTOMY    . VASECTOMY      Current Medications: Current Meds  Medication Sig  . cyclobenzaprine (FLEXERIL) 5 MG tablet Take 1 tablet (5 mg total) by mouth 3 (three) times daily as needed for muscle spasms.  Marland Kitchen FLUoxetine (PROZAC) 20 MG capsule TAKE 1 BY MOUTH DAILY  . fluticasone (FLONASE) 50 MCG/ACT nasal  spray Place 2 sprays into both nostrils daily.  Marland Kitchen lisinopril (ZESTRIL) 40 MG tablet Take 1 tablet (40 mg total) by mouth daily.  . naproxen (NAPROXEN DR) 500 MG EC tablet Take 1 tablet (500 mg total) by mouth 2 (two) times daily with a meal.  . niacin (NIASPAN) 1000 MG CR tablet Take 1 tablet (1,000 mg total) by mouth at bedtime.  . sildenafil (REVATIO) 20 MG tablet TAKE 2 TABLETS BY MOUTH DAILY AS DIRECTED  . tamsulosin (FLOMAX) 0.4 MG CAPS capsule Take 1 capsule (0.4 mg total) by mouth daily.  Marland Kitchen zolpidem (AMBIEN) 10 MG tablet Take 1 tablet (10 mg total) by mouth at bedtime as needed.     Allergies:    Nickel and Sulfacetamide sodium   Social History: Social History   Socioeconomic History  . Marital status: Married    Spouse name: Not on file  . Number of children: 5  . Years of education: Not on file  . Highest education level: Not on file  Occupational History  . Occupation: retired  Tobacco Use  . Smoking status: Former Research scientist (life sciences)  . Smokeless tobacco: Never Used  Substance and Sexual Activity  . Alcohol use: Yes    Alcohol/week: 1.0 standard drinks    Types: 1 Cans of beer per week  . Drug use: No  . Sexual activity: Not on file  Other Topics  Concern  . Not on file  Social History Narrative   Retired    Married    Social Determinants of Corporate investment banker Strain:   . Difficulty of Paying Living Expenses: Not on file  Food Insecurity:   . Worried About Programme researcher, broadcasting/film/video in the Last Year: Not on file  . Ran Out of Food in the Last Year: Not on file  Transportation Needs:   . Lack of Transportation (Medical): Not on file  . Lack of Transportation (Non-Medical): Not on file  Physical Activity:   . Days of Exercise per Week: Not on file  . Minutes of Exercise per Session: Not on file  Stress:   . Feeling of Stress : Not on file  Social Connections:   . Frequency of Communication with Friends and Family: Not on file  . Frequency of Social Gatherings with  Friends and Family: Not on file  . Attends Religious Services: Not on file  . Active Member of Clubs or Organizations: Not on file  . Attends Banker Meetings: Not on file  . Marital Status: Not on file     Family History: The patient's family history includes COPD in his mother; Cancer in his father; Heart disease in his mother; Heart failure in his mother.  ROS:   All other ROS reviewed and negative. Pertinent positives noted in the HPI.     EKGs/Labs/Other Studies Reviewed:   The following studies were personally reviewed by me today:  EKG:  EKG is ordered today.  The ekg ordered today demonstrates normal sinus rhythm, heart rate 81, inferior infarct likely old, no acute ST-T changes, and was personally reviewed by me.   Recent Labs: 09/24/2018: ALT 12; BUN 17; Creatinine, Ser 1.11; Hemoglobin 13.9; Platelets 130.0; Potassium 4.0; Sodium 142; TSH 2.93   Recent Lipid Panel    Component Value Date/Time   CHOL 151 09/24/2018 1106   TRIG 76.0 09/24/2018 1106   TRIG 48 01/28/2006 0840   HDL 35.80 (L) 09/24/2018 1106   CHOLHDL 4 09/24/2018 1106   VLDL 15.2 09/24/2018 1106   LDLCALC 100 (H) 09/24/2018 1106    Physical Exam:   VS:  BP 128/84   Pulse 81   Ht 5\' 10"  (1.778 m)   Wt 194 lb 9.6 oz (88.3 kg)   SpO2 96%   BMI 27.92 kg/m    Wt Readings from Last 3 Encounters:  03/16/19 194 lb 9.6 oz (88.3 kg)  09/24/18 187 lb (84.8 kg)  03/13/18 197 lb (89.4 kg)    General: Well nourished, well developed, in no acute distress Heart: Atraumatic, normal size  Eyes: PEERLA, EOMI  Neck: Supple, no JVD Endocrine: No thryomegaly Cardiac: Normal S1, S2; RRR; no murmurs, rubs, or gallops Lungs: Clear to auscultation bilaterally, no wheezing, rhonchi or rales  Abd: Soft, nontender, no hepatomegaly  Ext: No edema, pulses 2+ Musculoskeletal: No deformities, BUE and BLE strength normal and equal Skin: Warm and dry, no rashes   Neuro: Alert and oriented to person, place,  time, and situation, CNII-XII grossly intact, no focal deficits  Psych: Normal mood and affect   ASSESSMENT:   Harold Wilson. is a 74 y.o. male who presents for the following: 1. Other chest pain   2. Essential hypertension   3. Mixed hyperlipidemia   4. Precordial pain     PLAN:   1. Other chest pain -He presents with atypical chest pain.  He does have cardiovascular disease  risk factors including hyperlipidemia and hypertension.  He had a recent normal TSH.  I think we will proceed with an echocardiogram as well as a Lexiscan nuclear medicine stress test.  This will exclude structural heart disease as well as obstructive CAD as etiology of his chest pain.  We will plan to see him back in around 1 month for virtual appointment to follow-up his results.  2. Essential hypertension -Well-controlled today no changes current medications  3. Mixed hyperlipidemia -No need for niacin he can stop this  4. Precordial pain -Stress test as above   Disposition: Return in about 1 month (around 04/16/2019).  Medication Adjustments/Labs and Tests Ordered: Current medicines are reviewed at length with the patient today.  Concerns regarding medicines are outlined above.  Orders Placed This Encounter  Procedures  . Myocardial Perfusion Imaging  . ECHOCARDIOGRAM COMPLETE   No orders of the defined types were placed in this encounter.   Patient Instructions  Medication Instructions:  Continue same medications  Lab Work: None ordered  Testing/Procedures: Schedule Lexiscan myoview Schedule Echo  Follow-Up: At [facility], you and your health needs are our priority.  As part of our continuing mission to provide you with exceptional heart care, we have created designated Provider Care Teams.  These Care Teams include your primary Cardiologist (physician) and Advanced Practice Providers (APPs -  Physician Assistants and Nurse Practitioners) who all work together to provide you with the  care you need, when you need it.  Your next appointment:  1 month  The format for your next appointment:  Virtual   Provider:  Dr.O'Neal      Signed, Lenna Gilford. Flora Lipps, MD [facility]  [address] [place], [place] [address] [phone]  03/16/2019 5:32 PM

## 2019-03-16 ENCOUNTER — Other Ambulatory Visit: Payer: Self-pay

## 2019-03-16 ENCOUNTER — Encounter: Payer: Self-pay | Admitting: Cardiovascular Disease

## 2019-03-16 ENCOUNTER — Ambulatory Visit: Payer: PPO | Admitting: Cardiovascular Disease

## 2019-03-16 VITALS — BP 128/84 | HR 81 | Ht 70.0 in | Wt 194.6 lb

## 2019-03-16 DIAGNOSIS — I10 Essential (primary) hypertension: Secondary | ICD-10-CM | POA: Diagnosis not present

## 2019-03-16 DIAGNOSIS — R0789 Other chest pain: Secondary | ICD-10-CM

## 2019-03-16 DIAGNOSIS — R072 Precordial pain: Secondary | ICD-10-CM | POA: Diagnosis not present

## 2019-03-16 DIAGNOSIS — E782 Mixed hyperlipidemia: Secondary | ICD-10-CM

## 2019-03-16 NOTE — Patient Instructions (Signed)
Medication Instructions:  Continue same medications  Lab Work: None ordered  Testing/Procedures: Schedule Lexiscan myoview Schedule Echo  Follow-Up: At [facility], you and your health needs are our priority.  As part of our continuing mission to provide you with exceptional heart care, we have created designated Provider Care Teams.  These Care Teams include your primary Cardiologist (physician) and Advanced Practice Providers (APPs -  Physician Assistants and Nurse Practitioners) who all work together to provide you with the care you need, when you need it.  Your next appointment:  1 month  The format for your next appointment:  Virtual   Provider:  Dr.O'Neal

## 2019-03-17 ENCOUNTER — Telehealth (HOSPITAL_COMMUNITY): Payer: Self-pay

## 2019-03-17 ENCOUNTER — Other Ambulatory Visit (INDEPENDENT_AMBULATORY_CARE_PROVIDER_SITE_OTHER): Payer: PPO

## 2019-03-17 DIAGNOSIS — I10 Essential (primary) hypertension: Secondary | ICD-10-CM

## 2019-03-17 DIAGNOSIS — R072 Precordial pain: Secondary | ICD-10-CM

## 2019-03-17 DIAGNOSIS — E782 Mixed hyperlipidemia: Secondary | ICD-10-CM | POA: Diagnosis not present

## 2019-03-17 DIAGNOSIS — R0789 Other chest pain: Secondary | ICD-10-CM | POA: Diagnosis not present

## 2019-03-17 NOTE — Telephone Encounter (Signed)
Encounter complete. 

## 2019-03-18 ENCOUNTER — Other Ambulatory Visit: Payer: Self-pay

## 2019-03-18 ENCOUNTER — Ambulatory Visit (HOSPITAL_COMMUNITY)
Admission: RE | Admit: 2019-03-18 | Discharge: 2019-03-18 | Disposition: A | Discharge status: Home / Self Care | Payer: PPO | Source: Ambulatory Visit | Attending: Cardiology | Admitting: Cardiology

## 2019-03-18 DIAGNOSIS — R072 Precordial pain: Secondary | ICD-10-CM | POA: Diagnosis not present

## 2019-03-18 LAB — MYOCARDIAL PERFUSION IMAGING
LV dias vol: 137 mL (ref 62–150)
LV sys vol: 77 mL
Peak HR: 96 {beats}/min
Rest HR: 63 {beats}/min
SDS: 0
SRS: 6
SSS: 6
TID: 1.15

## 2019-03-18 MED ORDER — REGADENOSON 0.4 MG/5ML IV SOLN
0.4000 mg | Freq: Once | INTRAVENOUS | Status: AC
  Administered 2019-03-18: 0.4 mg via INTRAVENOUS

## 2019-03-18 MED ORDER — TECHNETIUM TC 99M TETROFOSMIN IV KIT
30.9000 | PACK | Freq: Once | INTRAVENOUS | Status: AC | PRN
  Administered 2019-03-18: 30.9 via INTRAVENOUS
  Filled 2019-03-18: qty 31

## 2019-03-18 MED ORDER — TECHNETIUM TC 99M TETROFOSMIN IV KIT
9.8000 | PACK | Freq: Once | INTRAVENOUS | Status: AC | PRN
  Administered 2019-03-18: 9.8 via INTRAVENOUS
  Filled 2019-03-18: qty 10

## 2019-03-25 ENCOUNTER — Ambulatory Visit (HOSPITAL_COMMUNITY): Payer: PPO | Attending: Internal Medicine

## 2019-03-25 ENCOUNTER — Encounter: Payer: Self-pay | Admitting: Adult Health

## 2019-03-25 ENCOUNTER — Other Ambulatory Visit: Payer: Self-pay

## 2019-03-25 DIAGNOSIS — R072 Precordial pain: Secondary | ICD-10-CM | POA: Diagnosis not present

## 2019-03-25 DIAGNOSIS — I10 Essential (primary) hypertension: Secondary | ICD-10-CM | POA: Diagnosis not present

## 2019-03-25 DIAGNOSIS — R0789 Other chest pain: Secondary | ICD-10-CM | POA: Insufficient documentation

## 2019-03-25 DIAGNOSIS — E782 Mixed hyperlipidemia: Secondary | ICD-10-CM | POA: Insufficient documentation

## 2019-03-26 NOTE — Telephone Encounter (Signed)
Called pt again no answer. Will call again Monday

## 2019-03-26 NOTE — Telephone Encounter (Signed)
Called pt for clarification of message

## 2019-03-29 NOTE — Telephone Encounter (Signed)
Pt returned the call to [facility]

## 2019-03-31 NOTE — Telephone Encounter (Signed)
Called pt no answer will call later.

## 2019-03-31 NOTE — Telephone Encounter (Signed)
The patient was returning your call about something that he had put in MyChart  Please Advise

## 2019-04-06 NOTE — Telephone Encounter (Signed)
Spoke to pt and advised to call the health department for Covid vaccination information. Pt also stated that he was told that study shows niacin do not help with Cholesterol. Pt wants to know if he should stop it. Also, pt wants to know if he can take cialis 10 mg instead of 5 mg.

## 2019-04-08 NOTE — Telephone Encounter (Signed)
Please advise 

## 2019-04-09 ENCOUNTER — Other Ambulatory Visit: Payer: Self-pay

## 2019-04-09 ENCOUNTER — Encounter: Payer: Self-pay | Admitting: Adult Health

## 2019-04-09 DIAGNOSIS — F528 Other sexual dysfunction not due to a substance or known physiological condition: Secondary | ICD-10-CM

## 2019-04-09 MED ORDER — TADALAFIL 10 MG PO TABS: 10.0000 mg | ORAL_TABLET | Freq: Every day | ORAL | 3 refills | Status: DC | PRN

## 2019-04-09 NOTE — Telephone Encounter (Signed)
This has been taking care of. Rx Viagra d/c Cialis 10 mg PRN sent to pharmacy for 15 tab 3 rf. Pt also advised that it is okay to stop taking niacin. Pt verbalized understanding. No further action needed.

## 2019-04-09 NOTE — Telephone Encounter (Signed)
Tried to call pt back to advised that this is the recommended qty. Alsos to advised that flexeril was never sent to pharmacy bc pt never ask for this Rx. Did not get an answer or vm.

## 2019-04-12 ENCOUNTER — Encounter: Payer: Self-pay | Admitting: Adult Health

## 2019-04-12 DIAGNOSIS — F419 Anxiety disorder, unspecified: Secondary | ICD-10-CM

## 2019-04-13 ENCOUNTER — Encounter: Payer: Self-pay | Admitting: Adult Health

## 2019-04-13 MED ORDER — FLUOXETINE HCL 20 MG PO CAPS: ORAL_CAPSULE | ORAL | 1 refills | Status: DC

## 2019-04-16 ENCOUNTER — Telehealth: Payer: PPO | Admitting: Cardiovascular Disease

## 2019-04-22 ENCOUNTER — Telehealth: Payer: Self-pay | Admitting: Cardiovascular Disease

## 2019-04-22 NOTE — Progress Notes (Deleted)
Cardiology Office Note:   Date:  04/22/2019  NAME:  Harold Wilson.    MRN: [id number] DOB:  05/21/1945   PCP:  [name], NP  Cardiologist:  No primary care provider on file.  Electrophysiologist:  None   Referring MD: [name], NP   No chief complaint on file. ***  History of Present Illness:   Harold Williamson. is a 74 y.o. male with a hx of hypertension hyperlipidemia who presents for follow-up of chest pain.  Underwent cardiac stress test which was normal.  Echocardiogram with normal left ventricular function.  Past Medical History: Past Medical History:  Diagnosis Date  . BENIGN PROSTATIC HYPERTROPHY 02/06/2007  . COLONIC POLYPS, HX OF 02/06/2007  . DIVERTICULOSIS, COLON 02/06/2007  . ERECTILE DYSFUNCTION, NON-ORGANIC, MILD 02/23/2009  . HYPERLIPIDEMIA 02/22/2008  . Hypertension   . OSTEOARTHRITIS 02/06/2007    Past Surgical History: Past Surgical History:  Procedure Laterality Date  . CERVICAL SPINE SURGERY    . ROTATOR CUFF REPAIR    . TEMPOROMANDIBULAR JOINT SURGERY    . TONSILLECTOMY    . VASECTOMY      Current Medications: No outpatient medications have been marked as taking for the 04/23/19 encounter (Appointment) with O'Neal, Ronnald Ramp, MD.     Allergies:    Nickel and Sulfacetamide sodium   Social History: Social History   Socioeconomic History  . Marital status: Married    Spouse name: Not on file  . Number of children: 5  . Years of education: Not on file  . Highest education level: Not on file  Occupational History  . Occupation: retired  Tobacco Use  . Smoking status: Former Games developer  . Smokeless tobacco: Never Used  Substance and Sexual Activity  . Alcohol use: Yes    Alcohol/week: 1.0 standard drinks    Types: 1 Cans of beer per week  . Drug use: No  . Sexual activity: Not on file  Other Topics Concern  . Not on file  Social History Narrative   Retired    Married    Social Determinants of Research scientist (physical sciences) Strain:   . Difficulty of Paying Living Expenses: Not on file  Food Insecurity:   . Worried About Programme researcher, broadcasting/film/video in the Last Year: Not on file  . Ran Out of Food in the Last Year: Not on file  Transportation Needs:   . Lack of Transportation (Medical): Not on file  . Lack of Transportation (Non-Medical): Not on file  Physical Activity:   . Days of Exercise per Week: Not on file  . Minutes of Exercise per Session: Not on file  Stress:   . Feeling of Stress : Not on file  Social Connections:   . Frequency of Communication with Friends and Family: Not on file  . Frequency of Social Gatherings with Friends and Family: Not on file  . Attends Religious Services: Not on file  . Active Member of Clubs or Organizations: Not on file  . Attends Banker Meetings: Not on file  . Marital Status: Not on file     Family History: The patient's ***family history includes COPD in his mother; Cancer in his father; Heart disease in his mother; Heart failure in his mother.  ROS:   All other ROS reviewed and negative. Pertinent positives noted in the HPI.     EKGs/Labs/Other Studies Reviewed:   The following studies were personally reviewed by me today:  EKG:  EKG  is *** ordered today.  The ekg ordered today demonstrates ***, and was personally reviewed by me.   NM Stress 03/18/2019 1. There is a fixed defect present in the apex. The wall motion in this region is normal. Overall, this is consistent with apical thinning artifact.  2. No evidence of ischemia.  3. Mildly reduced EF, 43%.  4. Mildly dilated LV.  5. Intermediate risk study due to LVEF. Recommend correlation with echocardiogram.   TTE 03/25/2019 1. Left ventricular ejection fraction, by visual estimation, is 55 to  60%. The left ventricle has normal function. There is no left ventricular  hypertrophy.  2. The left ventricle demonstrates regional wall motion abnormalities.  3. LVEF is normal with basal  inferior hypokinesis.  4. Global right ventricle has normal systolic function.The right  ventricular size is normal. No increase in right ventricular wall  thickness.  5. Left atrial size was normal.  6. Right atrial size was normal.  7. The mitral valve is normal in structure. Trivial mitral valve  regurgitation.  8. The tricuspid valve is normal in structure.  9. The tricuspid valve is normal in structure. Tricuspid valve  regurgitation is trivial.  10. The aortic valve is normal in structure. Aortic valve regurgitation is  not visualized.  11. The pulmonic valve was normal in structure. Pulmonic valve  regurgitation is trivial.  12. The inferior vena cava is normal in size with greater than 50%  respiratory variability, suggesting right atrial pressure of 3 mmHg.   Recent Labs: 09/24/2018: ALT 12; BUN 17; Creatinine, Ser 1.11; Hemoglobin 13.9; Platelets 130.0; Potassium 4.0; Sodium 142; TSH 2.93   Recent Lipid Panel    Component Value Date/Time   CHOL 151 09/24/2018 1106   TRIG 76.0 09/24/2018 1106   TRIG 48 01/28/2006 0840   HDL 35.80 (L) 09/24/2018 1106   CHOLHDL 4 09/24/2018 1106   VLDL 15.2 09/24/2018 1106   LDLCALC 100 (H) 09/24/2018 1106    Physical Exam:   VS:  There were no vitals taken for this visit.   Wt Readings from Last 3 Encounters:  03/18/19 194 lb (88 kg)  03/16/19 194 lb 9.6 oz (88.3 kg)  09/24/18 187 lb (84.8 kg)    General: Well nourished, well developed, in no acute distress Heart: Atraumatic, normal size  Eyes: PEERLA, EOMI  Neck: Supple, no JVD Endocrine: No thryomegaly Cardiac: Normal S1, S2; RRR; no murmurs, rubs, or gallops Lungs: Clear to auscultation bilaterally, no wheezing, rhonchi or rales  Abd: Soft, nontender, no hepatomegaly  Ext: No edema, pulses 2+ Musculoskeletal: No deformities, BUE and BLE strength normal and equal Skin: Warm and dry, no rashes   Neuro: Alert and oriented to person, place, time, and situation, CNII-XII  grossly intact, no focal deficits  Psych: Normal mood and affect   ASSESSMENT:   Harold Wilson. is a 74 y.o. male who presents for the following: No diagnosis found.  PLAN:   There are no diagnoses linked to this encounter.  Disposition: No follow-ups on file.  Medication Adjustments/Labs and Tests Ordered: Current medicines are reviewed at length with the patient today.  Concerns regarding medicines are outlined above.  No orders of the defined types were placed in this encounter.  No orders of the defined types were placed in this encounter.   There are no Patient Instructions on file for this visit.   Signed, Addison Naegeli. Audie Box, [place]  [address], [place], [place] [address] [phone]  04/22/2019 12:09 PM

## 2019-04-22 NOTE — Telephone Encounter (Signed)
LMTCB to reschedule appt with Dr. Flora Lipps. Currently scheduled for 2/19 at 8:40 AM.

## 2019-04-23 ENCOUNTER — Ambulatory Visit: Payer: PPO | Admitting: Cardiovascular Disease

## 2019-05-04 ENCOUNTER — Encounter: Payer: Self-pay | Admitting: Adult Health

## 2019-05-09 NOTE — Progress Notes (Signed)
Cardiology Office Note:   Date:  05/10/2019  NAME:  Harold Wilson.    MRN: [id number] DOB:  Apr 05, 1945   PCP:  [name], NP  Cardiologist:  Reatha Harps, MD   Referring MD: [name], NP   Chief Complaint  Patient presents with  . Chest Pain   History of Present Illness:   Librado Guandique. is a 74 y.o. male with a hx of HTN, HLD who presents for follow-up of chest pain. Stress without evidence of ischemia and echo unremarkable.  Her echocardiogram was read as hypokinesis of the basal inferior wall.  On my review there is no evidence of this.  This commonly is seen on echocardiograms.  Blood pressure today is a bit elevated.  He presents with a log that shows values over the last week of range systolically 147-166 and diastolic 86-100.  He reports that he is still getting episodic chest pain.  He reports that it is left-sided.  He reports that his burning sensation in the left chest.  Often worse at night and associated with cough.  It is alleviated by heartburn medication.  And belching.  He reports that he has been taking over-the-counter medication for this.  I instructed that it is best to take it in the morning on empty stomach with 8 ounces of water 30 minutes before he eats anything.  He will try this.  He is relieved to know that his stress test and ultrasound were normal.  He has plans to get more active over the next year.  Past Medical History: Past Medical History:  Diagnosis Date  . BENIGN PROSTATIC HYPERTROPHY 02/06/2007  . COLONIC POLYPS, HX OF 02/06/2007  . DIVERTICULOSIS, COLON 02/06/2007  . ERECTILE DYSFUNCTION, NON-ORGANIC, MILD 02/23/2009  . HYPERLIPIDEMIA 02/22/2008  . Hypertension   . OSTEOARTHRITIS 02/06/2007    Past Surgical History: Past Surgical History:  Procedure Laterality Date  . CERVICAL SPINE SURGERY    . ROTATOR CUFF REPAIR    . TEMPOROMANDIBULAR JOINT SURGERY    . TONSILLECTOMY    . VASECTOMY      Current Medications: Current  Meds  Medication Sig  . cyclobenzaprine (FLEXERIL) 5 MG tablet Take 1 tablet (5 mg total) by mouth 3 (three) times daily as needed for muscle spasms.  Marland Kitchen FLUoxetine (PROZAC) 20 MG capsule TAKE 1 BY MOUTH DAILY  . fluticasone (FLONASE) 50 MCG/ACT nasal spray Place 2 sprays into both nostrils daily.  Marland Kitchen lisinopril (ZESTRIL) 40 MG tablet Take 1 tablet (40 mg total) by mouth daily.  . naproxen (NAPROXEN DR) 500 MG EC tablet Take 1 tablet (500 mg total) by mouth 2 (two) times daily with a meal.  . tadalafil (CIALIS) 10 MG tablet Take 1 tablet (10 mg total) by mouth daily as needed for erectile dysfunction.  . tamsulosin (FLOMAX) 0.4 MG CAPS capsule Take 1 capsule (0.4 mg total) by mouth daily.  Marland Kitchen zolpidem (AMBIEN) 10 MG tablet Take 1 tablet (10 mg total) by mouth at bedtime as needed.     Allergies:    Nickel and Sulfacetamide sodium   Social History: Social History   Socioeconomic History  . Marital status: Married    Spouse name: Not on file  . Number of children: 5  . Years of education: Not on file  . Highest education level: Not on file  Occupational History  . Occupation: retired  Tobacco Use  . Smoking status: Former Games developer  . Smokeless tobacco: Never Used  Substance  and Sexual Activity  . Alcohol use: Yes    Alcohol/week: 1.0 standard drinks    Types: 1 Cans of beer per week  . Drug use: No  . Sexual activity: Not on file  Other Topics Concern  . Not on file  Social History Narrative   Retired    Married    Social Determinants of Corporate investment banker Strain:   . Difficulty of Paying Living Expenses: Not on file  Food Insecurity:   . Worried About Programme researcher, broadcasting/film/video in the Last Year: Not on file  . Ran Out of Food in the Last Year: Not on file  Transportation Needs:   . Lack of Transportation (Medical): Not on file  . Lack of Transportation (Non-Medical): Not on file  Physical Activity:   . Days of Exercise per Week: Not on file  . Minutes of Exercise per  Session: Not on file  Stress:   . Feeling of Stress : Not on file  Social Connections:   . Frequency of Communication with Friends and Family: Not on file  . Frequency of Social Gatherings with Friends and Family: Not on file  . Attends Religious Services: Not on file  . Active Member of Clubs or Organizations: Not on file  . Attends Banker Meetings: Not on file  . Marital Status: Not on file     Family History: The patient's family history includes COPD in his mother; Cancer in his father; Heart disease in his mother; Heart failure in his mother.  ROS:   All other ROS reviewed and negative. Pertinent positives noted in the HPI.     EKGs/Labs/Other Studies Reviewed:   The following studies were personally reviewed by me today:  NM Stress 03/18/2019  The left ventricular ejection fraction is moderately decreased (30-44%).  Nuclear stress EF: 43%.  There was no ST segment deviation noted during stress.  No T wave inversion was noted during stress.  Defect 1: There is a defect present in the apex location.  The study is normal.  This is an intermediate risk study.   1. There is a fixed defect present in the apex. The wall motion in this region is normal. Overall, this is consistent with apical thinning artifact.  2. No evidence of ischemia.  3. Mildly reduced EF, 43%.  4. Mildly dilated LV.  5. Intermediate risk study due to LVEF. Recommend correlation with echocardiogram.   TTE 03/25/2019 - 55-60% with basal inferior hypokinesis   Recent Labs: 09/24/2018: ALT 12; BUN 17; Creatinine, Ser 1.11; Hemoglobin 13.9; Platelets 130.0; Potassium 4.0; Sodium 142; TSH 2.93   Recent Lipid Panel    Component Value Date/Time   CHOL 151 09/24/2018 1106   TRIG 76.0 09/24/2018 1106   TRIG 48 01/28/2006 0840   HDL 35.80 (L) 09/24/2018 1106   CHOLHDL 4 09/24/2018 1106   VLDL 15.2 09/24/2018 1106   LDLCALC 100 (H) 09/24/2018 1106    Physical Exam:   VS:  BP (!) 150/98    Pulse 92   Ht 5\' 10"  (1.778 m)   Wt 197 lb 3.2 oz (89.4 kg)   SpO2 97%   BMI 28.30 kg/m    Wt Readings from Last 3 Encounters:  05/10/19 197 lb 3.2 oz (89.4 kg)  03/18/19 194 lb (88 kg)  03/16/19 194 lb 9.6 oz (88.3 kg)    General: Well nourished, well developed, in no acute distress Heart: Atraumatic, normal size  Eyes: PEERLA, EOMI  Neck:  Supple, no JVD Endocrine: No thryomegaly Cardiac: Normal S1, S2; RRR; no murmurs, rubs, or gallops Lungs: Clear to auscultation bilaterally, no wheezing, rhonchi or rales  Abd: Soft, nontender, no hepatomegaly  Ext: No edema, pulses 2+ Musculoskeletal: No deformities, BUE and BLE strength normal and equal Skin: Warm and dry, no rashes   Neuro: Alert and oriented to person, place, time, and situation, CNII-XII grossly intact, no focal deficits  Psych: Normal mood and affect   ASSESSMENT:   Riaan Toledo. is a 74 y.o. male who presents for the following: 1. Chest pain, unspecified type   2. Essential hypertension   3. Mixed hyperlipidemia     PLAN:   1. Chest pain, unspecified type -Atypical chest pain.  More consistent with GERD.  He will continue to take his heartburn medication.  He had a cardiac stress test without evidence of ischemia as well as an echocardiogram which showed normal LV function with normal wall motion.  His echocardiogram was read as hypokinesis of the basal inferior segment, on my review this is not there.  It is a normal echocardiogram.  He will continue to main attain a high level of activity and have his blood pressure treated.  2. Essential hypertension -We will add amlodipine 10 mg daily.  Review of his log shows his blood pressure not at goal.  Goal is less than 130/80.  3. Mixed hyperlipidemia -Most recent LDL around 100.  I think this is okay for now.  We will hold on statin medication.  Disposition: Return in about 1 year (around 05/09/2020).  Medication Adjustments/Labs and Tests Ordered: Current  medicines are reviewed at length with the patient today.  Concerns regarding medicines are outlined above.  No orders of the defined types were placed in this encounter.  Meds ordered this encounter  Medications  . amLODipine (NORVASC) 10 MG tablet    Sig: Take 1 tablet (10 mg total) by mouth daily.    Dispense:  180 tablet    Refill:  3    Patient Instructions  Medication Instructions:  Start Amlodipine 10 mg daily   *If you need a refill on your cardiac medications before your next appointment, please call your pharmacy*   Follow-Up: At [facility], you and your health needs are our priority.  As part of our continuing mission to provide you with exceptional heart care, we have created designated Provider Care Teams.  These Care Teams include your primary Cardiologist (physician) and Advanced Practice Providers (APPs -  Physician Assistants and Nurse Practitioners) who all work together to provide you with the care you need, when you need it.  We recommend signing up for the patient portal called "MyChart".  Sign up information is provided on this After Visit Summary.  MyChart is used to connect with patients for Virtual Visits (Telemedicine).  Patients are able to view lab/test results, encounter notes, upcoming appointments, etc.  Non-urgent messages can be sent to your provider as well.   To learn more about what you can do with MyChart, go to [email or web address].    Your next appointment:   12 month(s)  The format for your next appointment:   In Person  Provider:   Eleonore Chiquito, MD      Time Spent with Patient: I have spent a total of 35 minutes with patient reviewing hospital notes, telemetry, EKGs, labs and examining the patient as well as establishing an assessment and plan that was discussed with the patient.  > 50% of  time was spent in direct patient care.  Signed, Lenna Gilford. Flora Lipps, MD [facility]  [address] [place],  [place] [address] [phone]  05/10/2019 8:43 AM

## 2019-05-10 ENCOUNTER — Ambulatory Visit: Payer: PPO | Admitting: Cardiovascular Disease

## 2019-05-10 ENCOUNTER — Encounter: Payer: Self-pay | Admitting: Cardiovascular Disease

## 2019-05-10 ENCOUNTER — Other Ambulatory Visit: Payer: Self-pay

## 2019-05-10 VITALS — BP 150/98 | HR 92 | Ht 70.0 in | Wt 197.2 lb

## 2019-05-10 DIAGNOSIS — I10 Essential (primary) hypertension: Secondary | ICD-10-CM | POA: Diagnosis not present

## 2019-05-10 DIAGNOSIS — R079 Chest pain, unspecified: Secondary | ICD-10-CM

## 2019-05-10 DIAGNOSIS — E782 Mixed hyperlipidemia: Secondary | ICD-10-CM

## 2019-05-10 MED ORDER — AMLODIPINE BESYLATE 10 MG PO TABS: 10.0000 mg | ORAL_TABLET | Freq: Every day | ORAL | 3 refills | Status: DC

## 2019-05-10 NOTE — Patient Instructions (Signed)
Medication Instructions:  Start Amlodipine 10 mg daily   *If you need a refill on your cardiac medications before your next appointment, please call your pharmacy*   Follow-Up: At [facility], you and your health needs are our priority.  As part of our continuing mission to provide you with exceptional heart care, we have created designated Provider Care Teams.  These Care Teams include your primary Cardiologist (physician) and Advanced Practice Providers (APPs -  Physician Assistants and Nurse Practitioners) who all work together to provide you with the care you need, when you need it.  We recommend signing up for the patient portal called "MyChart".  Sign up information is provided on this After Visit Summary.  MyChart is used to connect with patients for Virtual Visits (Telemedicine).  Patients are able to view lab/test results, encounter notes, upcoming appointments, etc.  Non-urgent messages can be sent to your provider as well.   To learn more about what you can do with MyChart, go to [email or web address].    Your next appointment:   12 month(s)  The format for your next appointment:   In Person  Provider:   Lennie Odor, MD

## 2019-05-15 ENCOUNTER — Other Ambulatory Visit: Payer: Self-pay | Admitting: Adult Health

## 2019-05-24 ENCOUNTER — Encounter: Payer: Self-pay | Admitting: Adult Health

## 2019-05-25 ENCOUNTER — Other Ambulatory Visit: Payer: Self-pay | Admitting: Adult Health

## 2019-05-25 DIAGNOSIS — F5101 Primary insomnia: Secondary | ICD-10-CM

## 2019-05-25 MED ORDER — ZOLPIDEM TARTRATE 10 MG PO TABS: 10.0000 mg | ORAL_TABLET | Freq: Every evening | ORAL | 2 refills | Status: DC | PRN

## 2019-05-26 ENCOUNTER — Telehealth: Payer: Self-pay | Admitting: Adult Health

## 2019-05-26 NOTE — Progress Notes (Signed)
°  Chronic Care Management   Note  05/26/2019 Name: Harold Wilson. MRN: [id number] DOB: 1945-12-08  Harold Wilson. is a 74 y.o. year old male who is a primary care patient of [name], NP. I reached out to Harold Wilson. by phone today in response to a referral sent by Mr. LANNIE YUSUF Jr.'s PCP, [name], NP.   [name] was given information about Chronic Care Management services today including:  1. CCM service includes personalized support from designated clinical staff supervised by his physician, including individualized plan of care and coordination with other care providers 2. 24/7 contact phone numbers for assistance for urgent and routine care needs. 3. Service will only be billed when office clinical staff spend 20 minutes or more in a month to coordinate care. 4. Only one practitioner may furnish and bill the service in a calendar month. 5. The patient may stop CCM services at any time (effective at the end of the month) by phone call to the office staff.   Patient agreed to services and verbal consent obtained.   Follow up plan:   Raynicia Dukes [facility] Scheduler

## 2019-05-29 ENCOUNTER — Other Ambulatory Visit: Payer: Self-pay | Admitting: Adult Health

## 2019-05-29 DIAGNOSIS — I10 Essential (primary) hypertension: Secondary | ICD-10-CM

## 2019-05-31 NOTE — Telephone Encounter (Signed)
Sent to the pharmacy by e-scribe. 

## 2019-06-04 ENCOUNTER — Other Ambulatory Visit: Payer: Self-pay | Admitting: Adult Health

## 2019-06-04 DIAGNOSIS — F528 Other sexual dysfunction not due to a substance or known physiological condition: Secondary | ICD-10-CM

## 2019-06-04 DIAGNOSIS — I10 Essential (primary) hypertension: Secondary | ICD-10-CM

## 2019-06-07 NOTE — Telephone Encounter (Signed)
Lisinopril sent to the pharmacy on 05/31/2019 for 90 days.

## 2019-06-15 ENCOUNTER — Ambulatory Visit: Payer: PPO

## 2019-06-15 ENCOUNTER — Other Ambulatory Visit: Payer: Self-pay

## 2019-06-15 DIAGNOSIS — R351 Nocturia: Secondary | ICD-10-CM

## 2019-06-15 DIAGNOSIS — E785 Hyperlipidemia, unspecified: Secondary | ICD-10-CM

## 2019-06-15 DIAGNOSIS — M159 Polyosteoarthritis, unspecified: Secondary | ICD-10-CM

## 2019-06-15 DIAGNOSIS — I10 Essential (primary) hypertension: Secondary | ICD-10-CM

## 2019-06-15 DIAGNOSIS — F5101 Primary insomnia: Secondary | ICD-10-CM

## 2019-06-15 DIAGNOSIS — F419 Anxiety disorder, unspecified: Secondary | ICD-10-CM

## 2019-06-15 DIAGNOSIS — N401 Enlarged prostate with lower urinary tract symptoms: Secondary | ICD-10-CM

## 2019-06-15 DIAGNOSIS — M150 Primary generalized (osteo)arthritis: Secondary | ICD-10-CM

## 2019-06-15 NOTE — Chronic Care Management (AMB) (Signed)
Chronic Care Management Pharmacy  Name: Harold Wilson.  MRN: [id number] DOB: Mar 08, 1945  Initial Questions: 1. Have you seen any other providers since your last visit? NA 2. Any changes in your medicines or health? No   Chief Complaint/ HPI Harold Wilson.,  74 y.o. , male presents for their Initial CCM visit with the clinical pharmacist via telephone due to COVID-19 Pandemic.  PCP : Dorothyann Peng, NP  Their chronic conditions include: HTN, HLD, Anxiety, Osteoarthritis, BPH, Insomnia   Office Visits: 02/12/2019- Patient reported for virtual visit with Dorothyann Peng, NP for HTN follow up. Patient started lisinopril 5mg   About a month ago. Patient reported BP readings: 147-168/80-90s. Lisinopril increased to 20mg . Patient to continue to monitor BP at home and follow up in 2 weeks.   Consult Visit: 05/10/2019- Cardiology- Patient presented for office visit with Dr. Eleonore Chiquito, MD for chest pain follow up. Patient reported BP readings: 147-166/ 86-100 with episodic chest pain. Patient to continue taking heartburn medication. Cardiac stress test and ECHO showed normal LV function. For BP, amlodipine 10mg  daily was prescribed. BP goal: <130/80. LDL: 100s. Continue to hold statin medication.    03/16/2019- Cardiology- Patient presented for office visit with Dr. Eleonore Chiquito, MD for chest pain. Patient reports dull pain on left side of chest. Patient to obtain ECHO and Lexiscan nuclear medicine stress test. Patient to follow up in 1 month. Patient instructed he can stop Niacin.   Medications: Outpatient Encounter Medications as of 06/15/2019  Medication Sig  . amLODipine (NORVASC) 10 MG tablet Take 1 tablet (10 mg total) by mouth daily.  . cyclobenzaprine (FLEXERIL) 5 MG tablet TAKE ONE TABLET BY MOUTH THREE TIMES A DAY AS NEEDED FOR MUSCLE SPASMS  . FLUoxetine (PROZAC) 20 MG capsule TAKE 1 BY MOUTH DAILY  . fluticasone (FLONASE) 50 MCG/ACT nasal spray Place 2 sprays into both  nostrils daily.  Marland Kitchen lisinopril (ZESTRIL) 40 MG tablet TAKE ONE TABLET BY MOUTH DAILY  . naproxen (NAPROXEN DR) 500 MG EC tablet Take 1 tablet (500 mg total) by mouth 2 (two) times daily with a meal.  . tadalafil (CIALIS) 10 MG tablet TAKE ONE TABLET BY MOUTH DAILY AS NEEDED FOR ERECTILE DYSFUNCTION (Patient taking differently: 5 mg. )  . tamsulosin (FLOMAX) 0.4 MG CAPS capsule Take 1 capsule (0.4 mg total) by mouth daily.  Marland Kitchen zolpidem (AMBIEN) 10 MG tablet Take 1 tablet (10 mg total) by mouth at bedtime as needed.   No facility-administered encounter medications on file as of 06/15/2019.     Current Diagnosis/Assessment:  Goals Addressed            This Visit's Progress   . Pharmacy Care Plan       CARE PLAN ENTRY  Current Barriers:  . Chronic Disease Management support, education, and care coordination needs related to HTN, HLD, and Anxiety, Osteoarthritis, BPH, Insomnia  Uncontrolled hypertension . Current antihypertensive regimen:  amlodipine 10mg , 1 tablet once daily and lisinopril 40mg , 1 tablet once daily  . Previous antihypertensives tried: none . Last practice recorded BP readings:  BP Readings from Last 3 Encounters:  05/10/19 (!) 150/98  03/16/19 128/84  01/02/19 (!) 156/87 .  Current home BP readings: 166-063 (systolic blood pressure)   High cholesterol . Current antihyperlipidemic regimen: lifestyle modifications  . Previous antihyperlipidemic medications tried: niacin . Most recent lipid panel:     Component Value Date/Time   CHOL 151 09/24/2018 1106   TRIG 76.0 09/24/2018 1106   TRIG  48 01/28/2006 0840   HDL 35.80 (L) 09/24/2018 1106   CHOLHDL 4 09/24/2018 1106   VLDL 15.2 09/24/2018 1106   LDLCALC 100 (H) 09/24/2018 1106  The 10-year ASCVD risk score Denman George DC Jr., et al., 2013) is: 28.7%  Anxiety . Current regimen:  fluoxetine 20mg , 1 tablet once daily   Osteoarthritis . Current regimen: naproxen 500mg , 1 tablet twice daily with a meal and  cyclobenzaprine 5mg , 1 tablet three times a day as needed for muscle spasms   BPH . Current regimen:  tamsulosin 0.4mg , 1 capsule once daily   Insomnia . Current regimen:  zolpidem 10mg , 1 tablet at bedtime as needed   Pharmacist Clinical Goal(s):  Over the next 14 days, patient will work with PharmD and providers to optimize antihypertensive regimen . Blood pressure o Maintain blood pressure: <130/80 mmHg o Maintain low salt diet.  . High cholesterol:  . Cholesterol goals: Total Cholesterol goal under 200, Triglycerides goal under 150, HDL goal above 40 (men) or above 50 (women), LDL goal under 100.   Interventions: . Comprehensive medication review performed; medication list updated in electronic medical record.  care team collaboration  . Blood pressure:  . Discussed need to continue checking blood pressure at home.  . Discussed diet modifications. DASH diet:  following a diet emphasizing fruits and vegetables and low-fat dairy products along with whole grains, fish, poultry, and nuts. Reducing red meats and sugars.  . Exercising . Reducing the amount of salt intake to 1500mg /per day.  . Recommend using a salt substitute to replace your salt if you need flavor.    . Weight reduction- We discussed losing 5-10% of body weight . Continue:  amlodipine 10mg , 1 tablet once daily and lisinopril 40mg , 1 tablet once daily  . High cholesterol . How to reduce cholesterol through diet/weight management and physical activity.    . We discussed how a diet high in plant sterols (fruits/vegetables/nuts/whole grains/legumes) may reduce your cholesterol.  Encouraged increasing fiber to a daily intake of 10-25g/day  . Continue: lifestyle modifications only  . Anxiety . Continue: fluoxetine 20mg , 1 tablet once daily  . Osteoarthritis . Continue:  - naproxen 500mg , 1 tablet twice daily with a meal  - cyclobenzaprine 5mg , 1 tablet three times a day as needed for muscle spasms   . BPH  . Continue:  tamsulosin 0.4mg , 1 capsule once daily  . Insomnia . Continue: zolpidem 10mg , 1 tablet at bedtime as needed   Patient Self Care Activities:  . Patient will continue to check BP daily, document, and provide at future appointments . Continue current medications as directed by providers.  . Continue following up with primary care provider and/or specialists. . Continue at home blood pressure readings. . Continue working on healthy habits (diet/ exercise).  Initial goal documentation       SDOH Interventions     Most Recent Value  SDOH Interventions  SDOH Interventions for the Following Domains  Financial Strain, Transportation  Financial Strain Interventions  Referral [Provided information to patient. Patient requested to have on hand just in case needed in the future.]  Transportation Interventions  [facility] [Provided information to patient. Patient requested to have on hand just in case needed in the future.]      Hypertension   Office blood pressures are  BP Readings from Last 3 Encounters:  05/10/19 (!) 150/98  03/16/19 128/84  01/02/19 (!) 156/87   Patient has failed these meds in  the past: none  Patient checks BP at home 1-2x per week  Patient home BP readings are ranging: 140-150  Patient is uncontrolled on:  - amlodipine 10mg , 1 tablet once daily - lisinopril 40mg , 1 tablet once daily   We discussed diet and exercise extensively  - diet:  . DASH diet:  following a diet emphasizing fruits and vegetables and low-fat dairy products along with whole grains, fish, poultry, and nuts. Reducing red meats and sugars.  . Exercising   . Patient reports not exercising since his wife has limitations and he spends time with her.  . Reducing the amount of salt intake to 1500mg /per day.  . Patient reports he does not add salt to foods. Discussed reading labels for sodium content.  . Recommend using a salt  substitute to replace your salt if you need flavor.      . Weight reduction- We discussed losing 5-10% of body weight.    Plan Managed by Dr. . Discussed with patient BP goal <130/80 and may need further management.  Patient to increase BP readings per week and report BP readings for further assessment.  Patient would like exercises to do at home.  Continue current medications and control with diet and exercise.    Hyperlipidemia   Lipid Panel     Component Value Date/Time   CHOL 151 09/24/2018 1106   TRIG 76.0 09/24/2018 1106   TRIG 48 01/28/2006 0840   HDL 35.80 (L) 09/24/2018 1106   CHOLHDL 4 09/24/2018 1106   VLDL 15.2 09/24/2018 1106   LDLCALC 100 (H) 09/24/2018 1106    The 10-year ASCVD risk score 09/26/2018 DC Jr., et al., 2013) is: 28.7%   Values used to calculate the score:     Age: 31 years     Sex: Male     Is Non-Hispanic African American: No     Diabetic: No     Tobacco smoker: No     Systolic Blood Pressure: 150 mmHg     Is BP treated: Yes     HDL Cholesterol: 35.8 mg/dL     Total Cholesterol: 151 mg/dL   Patient has failed these meds in past: niacin  Patient is currently uncontrolled on the following medications:  - lifestyle modifications only   We discussed:  diet and exercise extensively  . How to reduce cholesterol through diet/weight management and physical activity.    . We discussed how a diet high in plant sterols (fruits/vegetables/nuts/whole grains/legumes) may reduce your cholesterol.  Plan Managed by cardiologist.  Continue control with diet and exercise  Anxiety  Patient has failed these meds in past: none  Patient is currently controlled on the following medications:  - fluoxetine 20mg , 1 tablet once daily   Plan Continue current medications.   Osteoarthritis   Patient has failed these meds in past: none  Patient is currently controlled on the following medications:  - naproxen 500mg , 1 tablet twice daily with a meal  -  cyclobenzaprine 5mg , 1 tablet three times a day as needed for muscle spasms   Plan Continue current medications. Cautioned use of NSAIDs in setting of uncontrolled HTN.   BPH  Patient has failed these meds in past: none  Patient is currently controlled on the following medications:  - tamsulosin 0.4mg , 1 capsule once daily   Plan Continue current medications  Insomnia   Patient has failed these meds in past:  Patient is currently controlled on the following medications:  - zolpidem 10mg , 1 tablet at  bedtime as needed    Plan Continue current medications   Medication Management  Patient organizes medications: does not use pill box; has routine for many years- lays out bottles in visible area.  Barriers: patient requested information for financial and transportation aid.  Adherence: no gaps in refill history (medication dispense history from 12/17/18 to 06/15/19)    Follow up Follow up visit with PharmD in 6 months.  Discussed with patient BP goal <130/80 and may need further management.  Patient reported further BP readings after visit (128/78, 135/77, 138/75 145/81, 138/79). Reviewed proper blood pressure monitoring techniques. Patient to continue BP readings and report in 2 weeks.   Conducted cost comparison per patient request for [facility] versus current pharmacies Production [facility], [facility]tele[facility] Mail order and [facility]).  --- advised patient costs similar to [place] order and [employer].     Laural Benes, PharmD Clinical Pharmacist [facility] [phone]

## 2019-06-20 ENCOUNTER — Other Ambulatory Visit: Payer: Self-pay | Admitting: Adult Health

## 2019-06-21 ENCOUNTER — Encounter: Payer: Self-pay | Admitting: Adult Health

## 2019-06-21 NOTE — Patient Instructions (Addendum)
. Visit Information  Goals Addressed            This Visit's Progress   . Pharmacy Care Plan       CARE PLAN ENTRY  Current Barriers:  . Chronic Disease Management support, education, and care coordination needs related to HTN, HLD, and Anxiety, Osteoarthritis, BPH, Insomnia  Uncontrolled hypertension . Current antihypertensive regimen:  amlodipine '10mg'$ , 1 tablet once daily and lisinopril '40mg'$ , 1 tablet once daily  . Previous antihypertensives tried: none . Last practice recorded BP readings:  BP Readings from Last 3 Encounters:  05/10/19 (!) 150/98  03/16/19 128/84  01/02/19 (!) 156/87 .  Current home BP readings: 096-283 (systolic blood pressure)   High cholesterol . Current antihyperlipidemic regimen: lifestyle modifications  . Previous antihyperlipidemic medications tried: niacin . Most recent lipid panel:     Component Value Date/Time   CHOL 151 09/24/2018 1106   TRIG 76.0 09/24/2018 1106   TRIG 48 01/28/2006 0840   HDL 35.80 (L) 09/24/2018 1106   CHOLHDL 4 09/24/2018 1106   VLDL 15.2 09/24/2018 1106   LDLCALC 100 (H) 09/24/2018 1106  The 10-year ASCVD risk score Mikey Bussing DC Jr., et al., 2013) is: 28.7%  Anxiety . Current regimen:  fluoxetine '20mg'$ , 1 tablet once daily   Osteoarthritis . Current regimen: naproxen '500mg'$ , 1 tablet twice daily with a meal and cyclobenzaprine '5mg'$ , 1 tablet three times a day as needed for muscle spasms   BPH . Current regimen:  tamsulosin 0.'4mg'$ , 1 capsule once daily   Insomnia . Current regimen:  zolpidem '10mg'$ , 1 tablet at bedtime as needed   Pharmacist Clinical Goal(s):  Marland Kitchen Over the next 14 days, patient will work with PharmD and providers to optimize antihypertensive regimen . Blood pressure o Maintain blood pressure: <130/80 mmHg o Maintain low salt diet.  . High cholesterol:  . Cholesterol goals: Total Cholesterol goal under 200, Triglycerides goal under 150, HDL goal above 40 (men) or above 50 (women), LDL goal under 100.    Interventions: . Comprehensive medication review performed; medication list updated in electronic medical record.  Harold Wilson care team collaboration  . Blood pressure:  . Discussed need to continue checking blood pressure at home.  . Discussed diet modifications. DASH diet:  following a diet emphasizing fruits and vegetables and low-fat dairy products along with whole grains, fish, poultry, and nuts. Reducing red meats and sugars.  . Exercising . Reducing the amount of salt intake to '1500mg'$ /per day.  . Recommend using a salt substitute to replace your salt if you need flavor.    . Weight reduction- We discussed losing 5-10% of body weight . Continue:  amlodipine '10mg'$ , 1 tablet once daily and lisinopril '40mg'$ , 1 tablet once daily  . High cholesterol . How to reduce cholesterol through diet/weight management and physical activity.    . We discussed how a diet high in plant sterols (fruits/vegetables/nuts/whole grains/legumes) may reduce your cholesterol.  Encouraged increasing fiber to a daily intake of 10-25g/day  . Continue: lifestyle modifications only  . Anxiety . Continue: fluoxetine '20mg'$ , 1 tablet once daily  . Osteoarthritis . Continue:  - naproxen '500mg'$ , 1 tablet twice daily with a meal  - cyclobenzaprine '5mg'$ , 1 tablet three times a day as needed for muscle spasms  . BPH  . Continue:  tamsulosin 0.'4mg'$ , 1 capsule once daily  . Insomnia . Continue: zolpidem '10mg'$ , 1 tablet at bedtime as needed   Patient Self Care Activities:  . Patient will continue to check BP  daily, document, and provide at future appointments . Continue current medications as directed by providers.  . Continue following up with primary care provider and/or specialists. . Continue at home blood pressure readings. . Continue working on healthy habits (diet/ exercise).  Initial goal documentation        Harold Wilson was given information about Chronic Care Management services today including:   1. CCM service includes personalized support from designated clinical staff supervised by his physician, including individualized plan of care and coordination with other care providers 2. 24/7 contact phone numbers for assistance for urgent and routine care needs. 3. Standard insurance, coinsurance, copays and deductibles apply for chronic care management only during months in which we provide at least 20 minutes of these services. Most insurances cover these services at 100%, however patients may be responsible for any copay, coinsurance and/or deductible if applicable. This service may help you avoid the need for more expensive face-to-face services. 4. Only one practitioner may furnish and bill the service in a calendar month. 5. The patient may stop CCM services at any time (effective at the end of the month) by phone call to the office staff.  Patient agreed to services and verbal consent obtained.   The patient verbalized understanding of instructions provided today and agreed to receive a mailed copy of patient instruction and/or educational materials. Telephone follow up appointment with pharmacy team member scheduled for: 12/15/2019  Anson Crofts, PharmD Clinical Pharmacist [facility] [phone]    Exercises To Do While Sitting  Exercises that you do while sitting (chair exercises) can give you many of the same benefits as full exercise. Benefits include strengthening your heart, burning calories, and keeping muscles and joints healthy. Exercise can also improve your mood and help with depression and anxiety. You may benefit from chair exercises if you are unable to do standing exercises because of:  Diabetic foot pain.  Obesity.  Illness.  Arthritis.  Recovery from surgery or injury.  Breathing problems.  Balance problems.  Another type of disability. Before starting chair exercises, check with your health care provider or a  physical therapist to find out how much exercise you can tolerate and which exercises are safe for you. If your health care provider approves:  Start out slowly and build up over time. Aim to work up to about 10-20 minutes for each exercise session.  Make exercise part of your daily routine.  Drink water when you exercise. Do not wait until you are thirsty. Drink every 10-15 minutes.  Stop exercising right away if you have pain, nausea, shortness of breath, or dizziness.  If you are exercising in a wheelchair, make sure to lock the wheels.  Ask your health care provider whether you can do tai chi or yoga. Many positions in these mind-body exercises can be modified to do while seated. Warm-up Before starting other exercises: 1. Sit up as straight as you can. Have your knees bent at 90 degrees, which is the shape of the capital letter "L." Keep your feet flat on the floor. 2. Sit at the front edge of your chair, if you can. 3. Pull in (tighten) the muscles in your abdomen and stretch your spine and neck as straight as you can. Hold this position for a few minutes. 4. Breathe in and out evenly. Try to concentrate on your breathing, and relax your mind. Stretching Exercise A: Arm stretch 1. Hold your arms out straight in front of your body.  2. Bend your hands at the wrist with your fingers pointing up, as if signaling someone to stop. Notice the slight tension in your forearms as you hold the position. 3. Keeping your arms out and your hands bent, rotate your hands outward as far as you can and hold this stretch. Aim to have your thumbs pointing up and your pinkie fingers pointing down. Slowly repeat arm stretches for one minute as tolerated. Exercise B: Leg stretch 1. If you can move your legs, try to "draw" letters on the floor with the toes of your foot. Write your name with one foot. 2. Write your name with the toes of your other foot. Slowly repeat the movements for one minute as  tolerated. Exercise C: Reach for the sky 1. Reach your hands as far over your head as you can to stretch your spine. 2. Move your hands and arms as if you are climbing a rope. Slowly repeat the movements for one minute as tolerated. Range of motion exercises Exercise A: Shoulder roll 1. Let your arms hang loosely at your sides. 2. Lift just your shoulders up toward your ears, then let them relax back down. 3. When your shoulders feel loose, rotate your shoulders in backward and forward circles. Do shoulder rolls slowly for one minute as tolerated. Exercise B: March in place 1. As if you are marching, pump your arms and lift your legs up and down. Lift your knees as high as you can. ? If you are unable to lift your knees, just pump your arms and move your ankles and feet up and down. March in place for one minute as tolerated. Exercise C: Seated jumping jacks 1. Let your arms hang down straight. 2. Keeping your arms straight, lift them up over your head. Aim to point your fingers to the ceiling. 3. While you lift your arms, straighten your legs and slide your heels along the floor to your sides, as wide as you can. 4. As you bring your arms back down to your sides, slide your legs back together. ? If you are unable to use your legs, just move your arms. Slowly repeat seated jumping jacks for one minute as tolerated. Strengthening exercises Exercise A: Shoulder squeeze 1. Hold your arms straight out from your body to your sides, with your elbows bent and your fists pointed at the ceiling. 2. Keeping your arms in the bent position, move them forward so your elbows and forearms meet in front of your face. 3. Open your arms back out as wide as you can with your elbows still bent, until you feel your shoulder blades squeezing together. Hold for 5 seconds. Slowly repeat the movements forward and backward for one minute as tolerated. Contact a health care provider if you:  Had to stop  exercising due to any of the following: ? Pain. ? Nausea. ? Shortness of breath. ? Dizziness. ? Fatigue.  Have significant pain or soreness after exercising. Get help right away if you have:  Chest pain.  Difficulty breathing. These symptoms may represent a serious problem that is an emergency. Do not wait to see if the symptoms will go away. Get medical help right away. Call your local emergency services (911 in the [place] Do not drive yourself to the hospital. This information is not intended to replace advice given to you by your health care provider. Make sure you discuss any questions you have with your health care provider. Document Revised: 06/11/2018 Document Reviewed: 01/01/2017 Elsevier Patient  Education  2020 [employer]  

## 2019-07-18 ENCOUNTER — Other Ambulatory Visit: Payer: Self-pay | Admitting: Adult Health

## 2019-07-18 DIAGNOSIS — N401 Enlarged prostate with lower urinary tract symptoms: Secondary | ICD-10-CM

## 2019-07-18 DIAGNOSIS — F528 Other sexual dysfunction not due to a substance or known physiological condition: Secondary | ICD-10-CM

## 2019-07-20 NOTE — Telephone Encounter (Signed)
Sent to the pharmacy by e-scribe. 

## 2019-08-13 ENCOUNTER — Other Ambulatory Visit: Payer: Self-pay | Admitting: Adult Health

## 2019-08-13 ENCOUNTER — Encounter: Payer: Self-pay | Admitting: Adult Health

## 2019-08-23 DIAGNOSIS — L738 Other specified follicular disorders: Secondary | ICD-10-CM | POA: Diagnosis not present

## 2019-08-23 DIAGNOSIS — L7211 Pilar cyst: Secondary | ICD-10-CM | POA: Diagnosis not present

## 2019-08-23 DIAGNOSIS — K219 Gastro-esophageal reflux disease without esophagitis: Secondary | ICD-10-CM | POA: Diagnosis not present

## 2019-08-23 DIAGNOSIS — H9113 Presbycusis, bilateral: Secondary | ICD-10-CM | POA: Diagnosis not present

## 2019-08-26 ENCOUNTER — Other Ambulatory Visit: Payer: Self-pay | Admitting: Adult Health

## 2019-08-26 DIAGNOSIS — I10 Essential (primary) hypertension: Secondary | ICD-10-CM

## 2019-08-31 ENCOUNTER — Encounter: Payer: Self-pay | Admitting: Adult Health

## 2019-09-02 ENCOUNTER — Encounter: Payer: Self-pay | Admitting: Adult Health

## 2019-09-02 ENCOUNTER — Other Ambulatory Visit: Payer: Self-pay | Admitting: Adult Health

## 2019-09-02 DIAGNOSIS — F5101 Primary insomnia: Secondary | ICD-10-CM

## 2019-09-09 ENCOUNTER — Other Ambulatory Visit: Payer: Self-pay | Admitting: Adult Health

## 2019-09-17 ENCOUNTER — Other Ambulatory Visit: Payer: Self-pay | Admitting: Adult Health

## 2019-09-17 DIAGNOSIS — F528 Other sexual dysfunction not due to a substance or known physiological condition: Secondary | ICD-10-CM

## 2019-09-17 DIAGNOSIS — N401 Enlarged prostate with lower urinary tract symptoms: Secondary | ICD-10-CM

## 2019-09-21 NOTE — Telephone Encounter (Signed)
SENT TO THE PHARMACY BY E-SCRIBE. 

## 2019-09-29 ENCOUNTER — Encounter: Payer: PPO | Admitting: Adult Health

## 2019-10-04 ENCOUNTER — Telehealth: Payer: Self-pay

## 2019-10-04 NOTE — Progress Notes (Addendum)
Chronic Care Management Pharmacy Assistant   Name: Harold Wilson.  MRN: [id number] DOB: Oct 03, 1945  Reason for Encounter: Disease State  Patient Questions:  1.  Have you seen any other providers since your last visit?Yes  2.  Any changes in your medicines or health? No  Their chronic conditions include: HTN, HLD, Anxiety, Osteoarthritis, BPH, Insomnia    PCP : [name], NP   Consults 08-23-2019 (Otolaryngology): Patient presented in the office for an initial consult with Dr. Pollyann Kennedy complaining of sinus drainage. Patient stated he had a history of postnasal drainage, nocturnal cough, sore throat, and heartburn for approximately one year. Per Dr. Okey Dupre, patient was advised to avoid TOB, caffeine, alcohol, chocolate, and peppermint as his sxs align with reflux from excessive Diet Coke. There were no medication changes. Patient is to follow up in two weeks. - Allergies:  - Allergies  Allergen Reactions   Nickel Rash   Sulfacetamide Sodium     Dryness of mouth     Medications: Outpatient Encounter Medications as of 10/04/2019  Medication Sig   amLODipine (NORVASC) 10 MG tablet Take 1 tablet (10 mg total) by mouth daily.   cyclobenzaprine (FLEXERIL) 5 MG tablet TAKE ONE TABLET BY MOUTH THREE TIMES A DAY AS NEEDED FOR MUSCLE SPASMS   FLUoxetine (PROZAC) 20 MG capsule TAKE 1 BY MOUTH DAILY   fluticasone (FLONASE) 50 MCG/ACT nasal spray Place 2 sprays into both nostrils daily.   lisinopril (ZESTRIL) 40 MG tablet TAKE ONE TABLET BY MOUTH DAILY   naproxen (NAPROXEN DR) 500 MG EC tablet Take 1 tablet (500 mg total) by mouth 2 (two) times daily with a meal.   tadalafil (CIALIS) 5 MG tablet TAKE ONE TABLET BY MOUTH DAILY   tamsulosin (FLOMAX) 0.4 MG CAPS capsule Take 1 capsule (0.4 mg total) by mouth daily.   zolpidem (AMBIEN) 10 MG tablet TAKE ONE TABLET BY MOUTH AT BEDTIME AS NEEDED   No facility-administered encounter medications on file as of 10/04/2019.    Current  Diagnosis: Patient Active Problem List   Diagnosis Date Noted   Essential hypertension 01/21/2019   Insomnia 06/02/2012   ADHD (attention deficit hyperactivity disorder) 11/11/2011   ERECTILE DYSFUNCTION, NON-ORGANIC, MILD 02/23/2009   Dyslipidemia 02/22/2008   DIVERTICULOSIS, COLON 02/06/2007   BPH (benign prostatic hyperplasia) 02/06/2007   Osteoarthritis 02/06/2007   History of colonic polyps 02/06/2007    Goals Addressed   None     Reviewed chart prior to disease state call. Spoke with patient regarding BP  Recent Office Vitals: BP Readings from Last 3 Encounters:  05/10/19 (!) 150/98  03/16/19 128/84  01/02/19 (!) 156/87   Pulse Readings from Last 3 Encounters:  05/10/19 92  03/16/19 81  01/02/19 83    Wt Readings from Last 3 Encounters:  05/10/19 197 lb 3.2 oz (89.4 kg)  03/18/19 194 lb (88 kg)  03/16/19 194 lb 9.6 oz (88.3 kg)     Kidney Function Lab Results  Component Value Date/Time   CREATININE 1.11 09/24/2018 11:06 AM   CREATININE 1.06 03/13/2018 03:49 PM   CREATININE 1.23 09/16/2017 04:12 PM   GFR 65.38 09/24/2018 11:06 AM   GFRNONAA 76.07 02/27/2010 09:13 AM   GFRAA 98 02/12/2008 08:34 AM    BMP Latest Ref Rng & Units 09/24/2018 03/13/2018 09/16/2017  Glucose 70 - 99 mg/dL 77 87 78  BUN 6 - 23 mg/dL 17 13 15   Creatinine 0.40 - 1.50 mg/dL 1.02 5.85  BUN/Creat Ratio 6 -  22 (calc) - NOT APPLICABLE -  Sodium 135 - 145 mEq/L 142 143 142  Potassium 3.5 - 5.1 mEq/L 4.0 3.6 4.1  Chloride 96 - 112 mEq/L 108 110 106  CO2 19 - 32 mEq/L 27 27 27   Calcium 8.4 - 10.5 mg/dL 9.0 ) 8.9    Current antihypertensive regimen:   amlodipine 10mg , 1 tablet once daily and lisinopril 40mg , 1 tablet once daily   How often are you checking your Blood Pressure? several times per month Current home BP readings: He did not have a current reading during call, but said when he checked it last the systolic read 114.  What recent interventions/DTPs have been made  by any provider to improve Blood Pressure control since last CPP Visit: none  Any recent hospitalizations or ED visits since last visit with CPP? No   What diet changes have been made to improve Blood Pressure Control?  Patient's wife makes most meals and has started to incorporate more fish and vegetables.  What exercise is being done to improve your Blood Pressure Control?  Patient states he doesn't do much exercise. Most of his activity comes from running errands.  Adherence Review: Is the patient currently on ACE/ARB medication? Yes Does the patient have >5 day gap between last estimated fill dates? Yes  Amlodipine  1 tablet (10 mg total) by mouth daily., Starting Mon 05/10/2019, Until Sun 08/08/2019   07/10/2019, CMA Clinical Pharmacist Assistant [phone]

## 2019-10-05 ENCOUNTER — Ambulatory Visit (INDEPENDENT_AMBULATORY_CARE_PROVIDER_SITE_OTHER): Payer: PPO | Admitting: Adult Health

## 2019-10-05 ENCOUNTER — Encounter: Payer: Self-pay | Admitting: Adult Health

## 2019-10-05 ENCOUNTER — Other Ambulatory Visit: Payer: Self-pay

## 2019-10-05 VITALS — BP 124/72 | Temp 98.6°F | Ht 69.0 in | Wt 193.0 lb

## 2019-10-05 DIAGNOSIS — N401 Enlarged prostate with lower urinary tract symptoms: Secondary | ICD-10-CM | POA: Diagnosis not present

## 2019-10-05 DIAGNOSIS — R351 Nocturia: Secondary | ICD-10-CM

## 2019-10-05 DIAGNOSIS — E785 Hyperlipidemia, unspecified: Secondary | ICD-10-CM

## 2019-10-05 DIAGNOSIS — I10 Essential (primary) hypertension: Secondary | ICD-10-CM

## 2019-10-05 DIAGNOSIS — F419 Anxiety disorder, unspecified: Secondary | ICD-10-CM | POA: Diagnosis not present

## 2019-10-05 DIAGNOSIS — Z0000 Encounter for general adult medical examination without abnormal findings: Secondary | ICD-10-CM | POA: Diagnosis not present

## 2019-10-05 DIAGNOSIS — F5101 Primary insomnia: Secondary | ICD-10-CM

## 2019-10-05 DIAGNOSIS — M159 Polyosteoarthritis, unspecified: Secondary | ICD-10-CM

## 2019-10-05 DIAGNOSIS — M8949 Other hypertrophic osteoarthropathy, multiple sites: Secondary | ICD-10-CM

## 2019-10-05 LAB — COMPLETE METABOLIC PANEL WITH GFR
AG Ratio: 1.9 (calc) (ref 1.0–2.5)
ALT: 12 U/L (ref 9–46)
AST: 14 U/L (ref 10–35)
Albumin: 3.9 g/dL (ref 3.6–5.1)
Alkaline phosphatase (APISO): 45 U/L (ref 35–144)
BUN/Creatinine Ratio: 12 (calc) (ref 6–22)
BUN: 15 mg/dL (ref 7–25)
CO2: 26 mmol/L (ref 20–32)
Calcium: 8.4 mg/dL — ABNORMAL LOW (ref 8.6–10.3)
Chloride: 109 mmol/L (ref 98–110)
Creat: 1.29 mg/dL — ABNORMAL HIGH (ref 0.70–1.18)
GFR, Est African American: 64 mL/min/{1.73_m2} (ref >=60)
GFR, Est Non African American: 55 mL/min/{1.73_m2} — ABNORMAL LOW (ref >=60)
Globulin: 2.1 g/dL (calc) (ref 1.9–3.7)
Glucose, Bld: 81 mg/dL (ref 65–99)
Potassium: 4.3 mmol/L (ref 3.5–5.3)
Sodium: 142 mmol/L (ref 135–146)
Total Bilirubin: 0.5 mg/dL (ref 0.2–1.2)
Total Protein: 6 g/dL — ABNORMAL LOW (ref 6.1–8.1)

## 2019-10-05 LAB — LIPID PANEL
Cholesterol: 135 mg/dL (ref <200)
HDL: 35 mg/dL — ABNORMAL LOW (ref >=40)
LDL Cholesterol (Calc): 86 mg/dL (calc)
Non-HDL Cholesterol (Calc): 100 mg/dL (calc) (ref <130)
Total CHOL/HDL Ratio: 3.9 (calc) (ref <5.0)
Triglycerides: 57 mg/dL (ref <150)

## 2019-10-05 LAB — TSH: TSH: 1.9 mIU/L (ref 0.40–4.50)

## 2019-10-05 LAB — CBC WITH DIFFERENTIAL/PLATELET
Absolute Monocytes: 427 cells/uL (ref 200–950)
Basophils Absolute: 22 cells/uL (ref 0–200)
Basophils Relative: 0.5 %
Eosinophils Absolute: 189 cells/uL (ref 15–500)
Eosinophils Relative: 4.3 %
HCT: 37.3 % — ABNORMAL LOW (ref 38.5–50.0)
Hemoglobin: 12.5 g/dL — ABNORMAL LOW (ref 13.2–17.1)
Lymphs Abs: 774 cells/uL — ABNORMAL LOW (ref 850–3900)
MCH: 30 pg (ref 27.0–33.0)
MCHC: 33.5 g/dL (ref 32.0–36.0)
MCV: 89.4 fL (ref 80.0–100.0)
MPV: 10.2 fL (ref 7.5–12.5)
Monocytes Relative: 9.7 %
Neutro Abs: 2988 cells/uL (ref 1500–7800)
Neutrophils Relative %: 67.9 %
Platelets: 144 10*3/uL (ref 140–400)
RBC: 4.17 10*6/uL — ABNORMAL LOW (ref 4.20–5.80)
RDW: 12.8 % (ref 11.0–15.0)
Total Lymphocyte: 17.6 %
WBC: 4.4 10*3/uL (ref 3.8–10.8)

## 2019-10-05 LAB — PSA: PSA: 2.4 ng/mL (ref <=4.0)

## 2019-10-05 MED ORDER — CYCLOBENZAPRINE HCL 5 MG PO TABS: ORAL_TABLET | ORAL | 0 refills | Status: DC

## 2019-10-05 NOTE — Progress Notes (Signed)
Subjective:    Patient ID: Harold Wilson., male    DOB: 06/19/1945, 74 y.o.   MRN: [id number]  HPI  Patient presents for yearly preventative medicine examination. He is a pleasant 74 year old male who  has a past medical history of BENIGN PROSTATIC HYPERTROPHY (02/06/2007), COLONIC POLYPS, HX OF (02/06/2007), DIVERTICULOSIS, COLON (02/06/2007), ERECTILE DYSFUNCTION, NON-ORGANIC, MILD (02/23/2009), HYPERLIPIDEMIA (02/22/2008), Hypertension, and OSTEOARTHRITIS (02/06/2007).  Hypertension -currently prescribed Norvasc 10 mg daily and lisinopril 40 mg daily. He does check his blood pressure at home and reports readings between 120-140/70-80's.   BP Readings from Last 3 Encounters:  10/05/19 124/72  05/10/19 (!) 150/98  03/16/19 128/84    Hyperlipidemia-cardiology holding statin medication at this time. Lab Results  Component Value Date   CHOL 151 09/24/2018   HDL 35.80 (L) 09/24/2018   LDLCALC 100 (H) 09/24/2018   TRIG 76.0 09/24/2018   CHOLHDL 4 09/24/2018    Anxiety-early prescribed Prozac 20 mg daily.  He reports that he feels well controlled on  this medication.  Osteoarthritis Current regimen includes naproxen 500 mg twice daily and Flexeril 5 mg 3 times daily as needed  BPH-takes Flomax 0.4 mg daily and Cialis 5 mg daily. .  Feels well controlled  Insomnia-takes Ambien 10 mg nightly and feels as though he is getting restful sleep   All immunizations and health maintenance protocols were reviewed with the patient and needed orders were placed.  Appropriate screening laboratory values were ordered for the patient including screening of hyperlipidemia, renal function and hepatic function. If indicated by BPH, a PSA was ordered.  Medication reconciliation,  past medical history, social history, problem list and allergies were reviewed in detail with the patient  Goals were established with regard to weight loss, exercise, and  diet in compliance with medications  Wt  Readings from Last 3 Encounters:  10/05/19 193 lb (87.5 kg)  05/10/19 197 lb 3.2 oz (89.4 kg)  03/18/19 194 lb (88 kg)    End of life planning was discussed.  He denies any acute issues   Review of Systems  Constitutional: Negative.   HENT: Negative.   Eyes: Negative.   Respiratory: Negative.   Cardiovascular: Negative.   Gastrointestinal: Negative.   Endocrine: Negative.   Genitourinary: Negative.   Musculoskeletal: Positive for arthralgias and back pain.  Skin: Negative.   Allergic/Immunologic: Negative.   Neurological: Negative.   Hematological: Negative.   Psychiatric/Behavioral: Negative.   All other systems reviewed and are negative.  Past Medical History:  Diagnosis Date  . BENIGN PROSTATIC HYPERTROPHY 02/06/2007  . COLONIC POLYPS, HX OF 02/06/2007  . DIVERTICULOSIS, COLON 02/06/2007  . ERECTILE DYSFUNCTION, NON-ORGANIC, MILD 02/23/2009  . HYPERLIPIDEMIA 02/22/2008  . Hypertension   . OSTEOARTHRITIS 02/06/2007    Social History   Socioeconomic History  . Marital status: Married    Spouse name: Not on file  . Number of children: 5  . Years of education: Not on file  . Highest education level: Not on file  Occupational History  . Occupation: retired  Tobacco Use  . Smoking status: Former Research scientist (life sciences)  . Smokeless tobacco: Never Used  Substance and Sexual Activity  . Alcohol use: Yes    Alcohol/week: 1.0 standard drink    Types: 1 Cans of beer per week  . Drug use: No  . Sexual activity: Not on file  Other Topics Concern  . Not on file  Social History Narrative   Retired    Married  Social Determinants of Health   Financial Resource Strain: Medium Risk  . Difficulty of Paying Living Expenses: Somewhat hard  Food Insecurity:   . Worried About Charity fundraiser in the Last Year:   . Arboriculturist in the Last Year:   Transportation Needs: No Transportation Needs  . Lack of Transportation (Medical): No  . Lack of Transportation (Non-Medical): No    Physical Activity:   . Days of Exercise per Week:   . Minutes of Exercise per Session:   Stress:   . Feeling of Stress :   Social Connections:   . Frequency of Communication with Friends and Family:   . Frequency of Social Gatherings with Friends and Family:   . Attends Religious Services:   . Active Member of Clubs or Organizations:   . Attends Archivist Meetings:   Marland Kitchen Marital Status:   Intimate Partner Violence:   . Fear of Current or Ex-Partner:   . Emotionally Abused:   Marland Kitchen Physically Abused:   . Sexually Abused:     Past Surgical History:  Procedure Laterality Date  . CERVICAL SPINE SURGERY    . ROTATOR CUFF REPAIR    . TEMPOROMANDIBULAR JOINT SURGERY    . TONSILLECTOMY    . VASECTOMY      Family History  Problem Relation Age of Onset  . COPD Mother   . Heart disease Mother   . Heart failure Mother   . Cancer Father        lung ca    Allergies  Allergen Reactions  . Nickel Rash  . Sulfacetamide Sodium     Dryness of mouth     Current Outpatient Medications on File Prior to Visit  Medication Sig Dispense Refill  . cyclobenzaprine (FLEXERIL) 5 MG tablet TAKE ONE TABLET BY MOUTH THREE TIMES A DAY AS NEEDED FOR MUSCLE SPASMS 30 tablet 0  . FLUoxetine (PROZAC) 20 MG capsule TAKE 1 BY MOUTH DAILY 90 capsule 1  . fluticasone (FLONASE) 50 MCG/ACT nasal spray Place 2 sprays into both nostrils daily. 48 g 3  . lisinopril (ZESTRIL) 40 MG tablet TAKE ONE TABLET BY MOUTH DAILY 90 tablet 1  . naproxen (NAPROXEN DR) 500 MG EC tablet Take 1 tablet (500 mg total) by mouth 2 (two) times daily with a meal. 180 tablet 1  . tadalafil (CIALIS) 5 MG tablet TAKE ONE TABLET BY MOUTH DAILY 30 tablet 0  . tamsulosin (FLOMAX) 0.4 MG CAPS capsule Take 1 capsule (0.4 mg total) by mouth daily. 90 capsule 3  . zolpidem (AMBIEN) 10 MG tablet TAKE ONE TABLET BY MOUTH AT BEDTIME AS NEEDED 30 tablet 1  . amLODipine (NORVASC) 10 MG tablet Take 1 tablet (10 mg total) by mouth daily.  180 tablet 3   No current facility-administered medications on file prior to visit.    BP 124/72   Temp 98.6 F (37 C)   Ht _0  (1.753 m)   Wt 193 lb (87.5 kg)   BMI 28.50 kg/m       Objective:   Physical Exam Vitals and nursing note reviewed.  Constitutional:      General: He is not in acute distress.    Appearance: Normal appearance. He is well-developed. He is obese.  HENT:     Head: Normocephalic and atraumatic.     Right Ear: Tympanic membrane, ear canal and external ear normal. There is no impacted cerumen.     Left Ear: Tympanic membrane, ear canal  and external ear normal. There is no impacted cerumen.     Nose: Nose normal. No congestion or rhinorrhea.     Mouth/Throat:     Mouth: Mucous membranes are moist.     Pharynx: Oropharynx is clear. No oropharyngeal exudate or posterior oropharyngeal erythema.  Eyes:     General:        Right eye: No discharge.        Left eye: No discharge.     Extraocular Movements: Extraocular movements intact.     Conjunctiva/sclera: Conjunctivae normal.     Pupils: Pupils are equal, round, and reactive to light.  Neck:     Vascular: No carotid bruit.     Trachea: No tracheal deviation.  Cardiovascular:     Rate and Rhythm: Normal rate and regular rhythm.     Pulses: Normal pulses.     Heart sounds: Normal heart sounds. No murmur heard.  No friction rub. No gallop.   Pulmonary:     Effort: Pulmonary effort is normal. No respiratory distress.     Breath sounds: Normal breath sounds. No stridor. No wheezing, rhonchi or rales.  Chest:     Chest wall: No tenderness.  Abdominal:     General: Bowel sounds are normal. There is no distension.     Palpations: Abdomen is soft. There is no mass.     Tenderness: There is no abdominal tenderness. There is no right CVA tenderness, left CVA tenderness, guarding or rebound.     Hernia: No hernia is present.  Musculoskeletal:        General: No swelling, tenderness, deformity or signs of  injury. Normal range of motion.     Right lower leg: No edema.     Left lower leg: No edema.  Lymphadenopathy:     Cervical: No cervical adenopathy.  Skin:    General: Skin is warm and dry.     Capillary Refill: Capillary refill takes less than 2 seconds.     Coloration: Skin is not jaundiced or pale.     Findings: No bruising, erythema, lesion or rash.  Neurological:     General: No focal deficit present.     Mental Status: He is alert and oriented to person, place, and time.     Cranial Nerves: No cranial nerve deficit.     Sensory: No sensory deficit.     Motor: No weakness.     Coordination: Coordination normal.     Gait: Gait normal.     Deep Tendon Reflexes: Reflexes normal.  Psychiatric:        Mood and Affect: Mood normal.        Behavior: Behavior normal.        Thought Content: Thought content normal.        Judgment: Judgment normal.       Assessment & Plan:  1. Routine general medical examination at a health care facility - Follow up in one year or sooner if needed - Continue to work on weight loss through diet and exercise - CBC with Differential/Platelet; Future - Lipid panel; Future - TSH; Future - CMP with eGFR([facility]); Future  2. Essential hypertension - BP at goal during this office visit  - CBC with Differential/Platelet; Future - Lipid panel; Future - TSH; Future - CMP with eGFR([facility]); Future  3. Dyslipidemia - Consider statin  - CBC with Differential/Platelet; Future - Lipid panel; Future - TSH; Future - CMP with eGFR([facility]); Future  4. Anxiety - well controlled with  prilosec   5. Primary osteoarthritis involving multiple joints  - cyclobenzaprine (FLEXERIL) 5 MG tablet; TAKE ONE TABLET BY MOUTH THREE TIMES A DAY AS NEEDED FOR MUSCLE SPASMS  Dispense: 90 tablet; Refill: 0  6. Benign prostatic hyperplasia with nocturia - Continue with flomax and Cialis  - PSA; Future  7. Primary insomnia - Continue with Harold Haver, NP

## 2019-10-05 NOTE — Patient Instructions (Signed)
It was great seeing you today   We will follow up with you regarding your blood work   Health Maintenance, Male A healthy lifestyle and preventative care can promote health and wellness.  Maintain regular health, dental, and eye exams.  Eat a healthy diet. Foods like vegetables, fruits, whole grains, low-fat dairy products, and lean protein foods contain the nutrients you need and are low in calories. Decrease your intake of foods high in solid fats, added sugars, and salt. Get information about a proper diet from your health care provider, if necessary.  Regular physical exercise is one of the most important things you can do for your health. Most adults should get at least 150 minutes of moderate-intensity exercise (any activity that increases your heart rate and causes you to sweat) each week. In addition, most adults need muscle-strengthening exercises on 2 or more days a week.   Maintain a healthy weight. The body mass index (BMI) is a screening tool to identify possible weight problems. It provides an estimate of body fat based on height and weight. Your health care provider can find your BMI and can help you achieve or maintain a healthy weight. For males 20 years and older:  A BMI below 18.5 is considered underweight.  A BMI of 18.5 to 24.9 is normal.  A BMI of 25 to 29.9 is considered overweight.  A BMI of 30 and above is considered obese.  Maintain normal blood lipids and cholesterol by exercising and minimizing your intake of saturated fat. Eat a balanced diet with plenty of fruits and vegetables. Blood tests for lipids and cholesterol should begin at age 20 and be repeated every 5 years. If your lipid or cholesterol levels are high, you are over age 50, or you are at high risk for heart disease, you may need your cholesterol levels checked more frequently.Ongoing high lipid and cholesterol levels should be treated with medicines if diet and exercise are not working.  If you  smoke, find out from your health care provider how to quit. If you do not use tobacco, do not start.  Lung cancer screening is recommended for adults aged 55-80 years who are at high risk for developing lung cancer because of a history of smoking. A yearly low-dose CT scan of the lungs is recommended for people who have at least a 30-pack-year history of smoking and are current smokers or have quit within the past 15 years. A pack year of smoking is smoking an average of 1 pack of cigarettes a day for 1 year (for example, a 30-pack-year history of smoking could mean smoking 1 pack a day for 30 years or 2 packs a day for 15 years). Yearly screening should continue until the smoker has stopped smoking for at least 15 years. Yearly screening should be stopped for people who develop a health problem that would prevent them from having lung cancer treatment.  If you choose to drink alcohol, do not have more than 2 drinks per day. One drink is considered to be 12 oz (360 mL) of beer, 5 oz (150 mL) of wine, or 1.5 oz (45 mL) of liquor.  Avoid the use of street drugs. Do not share needles with anyone. Ask for help if you need support or instructions about stopping the use of drugs.  High blood pressure causes heart disease and increases the risk of stroke. High blood pressure is more likely to develop in:  People who have blood pressure in the end of   the normal range (100-139/85-89 mm Hg).  People who are overweight or obese.  People who are African American.  If you are 18-39 years of age, have your blood pressure checked every 3-5 years. If you are 40 years of age or older, have your blood pressure checked every year. You should have your blood pressure measured twice--once when you are at a hospital or clinic, and once when you are not at a hospital or clinic. Record the average of the two measurements. To check your blood pressure when you are not at a hospital or clinic, you can use:  An automated  blood pressure machine at a pharmacy.  A home blood pressure monitor.  If you are 45-79 years old, ask your health care provider if you should take aspirin to prevent heart disease.  Diabetes screening involves taking a blood sample to check your fasting blood sugar level. This should be done once every 3 years after age 45 if you are at a normal weight and without risk factors for diabetes. Testing should be considered at a younger age or be carried out more frequently if you are overweight and have at least 1 risk factor for diabetes.  Colorectal cancer can be detected and often prevented. Most routine colorectal cancer screening begins at the age of 50 and continues through age 75. However, your health care provider may recommend screening at an earlier age if you have risk factors for colon cancer. On a yearly basis, your health care provider may provide home test kits to check for hidden blood in the stool. A small camera at the end of a tube may be used to directly examine the colon (sigmoidoscopy or colonoscopy) to detect the earliest forms of colorectal cancer. Talk to your health care provider about this at age 50 when routine screening begins. A direct exam of the colon should be repeated every 5-10 years through age 75, unless early forms of precancerous polyps or small growths are found.  People who are at an increased risk for hepatitis B should be screened for this virus. You are considered at high risk for hepatitis B if:  You were born in a country where hepatitis B occurs often. Talk with your health care provider about which countries are considered high risk.  Your parents were born in a high-risk country and you have not received a shot to protect against hepatitis B (hepatitis B vaccine).  You have HIV or AIDS.  You use needles to inject street drugs.  You live with, or have sex with, someone who has hepatitis B.  You are a man who has sex with other men (MSM).  You get  hemodialysis treatment.  You take certain medicines for conditions like cancer, organ transplantation, and autoimmune conditions.  Hepatitis C blood testing is recommended for all people born from 1945 through 1965 and any individual with known risk factors for hepatitis C.  Healthy men should no longer receive prostate-specific antigen (PSA) blood tests as part of routine cancer screening. Talk to your health care provider about prostate cancer screening.  Testicular cancer screening is not recommended for adolescents or adult males who have no symptoms. Screening includes self-exam, a health care provider exam, and other screening tests. Consult with your health care provider about any symptoms you have or any concerns you have about testicular cancer.  Practice safe sex. Use condoms and avoid high-risk sexual practices to reduce the spread of sexually transmitted infections (STIs).  You should be screened   for STIs, including gonorrhea and chlamydia if:  You are sexually active and are younger than 24 years.  You are older than 24 years, and your health care provider tells you that you are at risk for this type of infection.  Your sexual activity has changed since you were last screened, and you are at an increased risk for chlamydia or gonorrhea. Ask your health care provider if you are at risk.  If you are at risk of being infected with HIV, it is recommended that you take a prescription medicine daily to prevent HIV infection. This is called pre-exposure prophylaxis (PrEP). You are considered at risk if:  You are a man who has sex with other men (MSM).  You are a heterosexual man who is sexually active with multiple partners.  You take drugs by injection.  You are sexually active with a partner who has HIV.  Talk with your health care provider about whether you are at high risk of being infected with HIV. If you choose to begin PrEP, you should first be tested for HIV. You should  then be tested every 3 months for as long as you are taking PrEP.  Use sunscreen. Apply sunscreen liberally and repeatedly throughout the day. You should seek shade when your shadow is shorter than you. Protect yourself by wearing long sleeves, pants, a wide-brimmed hat, and sunglasses year round whenever you are outdoors.  Tell your health care provider of new moles or changes in moles, especially if there is a change in shape or color. Also, tell your health care provider if a mole is larger than the size of a pencil eraser.  A one-time screening for abdominal aortic aneurysm (AAA) and surgical repair of large AAAs by ultrasound is recommended for men aged 65-75 years who are current or former smokers.  Stay current with your vaccines (immunizations).   This information is not intended to replace advice given to you by your health care provider. Make sure you discuss any questions you have with your health care provider.   Document Released: 08/17/2007 Document Revised: 03/11/2014 Document Reviewed: 07/16/2010 Elsevier Interactive Patient Education 2016 [employer]   

## 2019-10-13 NOTE — Progress Notes (Signed)
Callled patient to clarify if still taking amlodipine since last time refilled was on 05/10/2019 for 90 day supply. Unable to leave message since VM is full.

## 2019-10-14 ENCOUNTER — Encounter: Payer: Self-pay | Admitting: Adult Health

## 2019-10-14 DIAGNOSIS — N401 Enlarged prostate with lower urinary tract symptoms: Secondary | ICD-10-CM

## 2019-10-14 DIAGNOSIS — R351 Nocturia: Secondary | ICD-10-CM

## 2019-10-14 DIAGNOSIS — I10 Essential (primary) hypertension: Secondary | ICD-10-CM

## 2019-10-14 DIAGNOSIS — M159 Polyosteoarthritis, unspecified: Secondary | ICD-10-CM

## 2019-10-14 DIAGNOSIS — F419 Anxiety disorder, unspecified: Secondary | ICD-10-CM

## 2019-10-14 DIAGNOSIS — F528 Other sexual dysfunction not due to a substance or known physiological condition: Secondary | ICD-10-CM

## 2019-10-14 DIAGNOSIS — F5101 Primary insomnia: Secondary | ICD-10-CM

## 2019-10-19 MED ORDER — FLUOXETINE HCL 20 MG PO CAPS: ORAL_CAPSULE | ORAL | 1 refills | Status: DC

## 2019-10-19 MED ORDER — FLUTICASONE PROPIONATE 50 MCG/ACT NA SUSP: 2.0000 | Freq: Every day | NASAL | 3 refills | Status: DC

## 2019-10-19 MED ORDER — NAPROXEN 500 MG PO TBEC: 500.0000 mg | DELAYED_RELEASE_TABLET | Freq: Two times a day (BID) | ORAL | 1 refills | Status: DC

## 2019-10-19 MED ORDER — TADALAFIL 5 MG PO TABS: 5.0000 mg | ORAL_TABLET | Freq: Every day | ORAL | 5 refills | Status: DC

## 2019-10-19 MED ORDER — ZOLPIDEM TARTRATE 10 MG PO TABS: 10.0000 mg | ORAL_TABLET | Freq: Every evening | ORAL | 2 refills | Status: DC | PRN

## 2019-10-19 MED ORDER — TAMSULOSIN HCL 0.4 MG PO CAPS: 0.4000 mg | ORAL_CAPSULE | Freq: Every day | ORAL | 3 refills | Status: DC

## 2019-10-19 MED ORDER — CYCLOBENZAPRINE HCL 5 MG PO TABS: ORAL_TABLET | ORAL | 0 refills | Status: DC

## 2019-10-19 MED ORDER — LISINOPRIL 40 MG PO TABS: 40.0000 mg | ORAL_TABLET | Freq: Every day | ORAL | 3 refills | Status: DC

## 2019-10-19 NOTE — Telephone Encounter (Signed)
All meds have been sent in with the exception of the Ambien and Flexeril.  Please send.

## 2019-10-28 ENCOUNTER — Encounter: Payer: Self-pay | Admitting: Adult Health

## 2019-11-03 ENCOUNTER — Telehealth: Payer: Self-pay | Admitting: Adult Health

## 2019-11-03 NOTE — Telephone Encounter (Signed)
Spoke with patient he was unable to schedule at this time and will call back.

## 2019-11-28 ENCOUNTER — Other Ambulatory Visit: Payer: Self-pay | Admitting: Adult Health

## 2019-11-28 DIAGNOSIS — M159 Polyosteoarthritis, unspecified: Secondary | ICD-10-CM

## 2019-12-15 ENCOUNTER — Telehealth: Payer: PPO

## 2020-01-24 ENCOUNTER — Other Ambulatory Visit: Payer: Self-pay | Admitting: Adult Health

## 2020-01-24 DIAGNOSIS — M159 Polyosteoarthritis, unspecified: Secondary | ICD-10-CM

## 2020-01-24 DIAGNOSIS — M8949 Other hypertrophic osteoarthropathy, multiple sites: Secondary | ICD-10-CM

## 2020-02-01 ENCOUNTER — Other Ambulatory Visit: Payer: Self-pay | Admitting: Adult Health

## 2020-02-01 ENCOUNTER — Encounter: Payer: Self-pay | Admitting: Adult Health

## 2020-02-01 DIAGNOSIS — F5101 Primary insomnia: Secondary | ICD-10-CM

## 2020-02-01 NOTE — Telephone Encounter (Signed)
Rx last filled 12/30/19, okay to refill?

## 2020-02-01 NOTE — Telephone Encounter (Signed)
Refilled once in Cory's absence. 

## 2020-02-25 ENCOUNTER — Encounter: Payer: Self-pay | Admitting: Adult Health

## 2020-02-26 ENCOUNTER — Encounter: Payer: Self-pay | Admitting: Adult Health

## 2020-02-26 ENCOUNTER — Other Ambulatory Visit: Payer: Self-pay | Admitting: Adult Health

## 2020-02-26 DIAGNOSIS — I10 Essential (primary) hypertension: Secondary | ICD-10-CM

## 2020-02-28 ENCOUNTER — Encounter: Payer: Self-pay | Admitting: Adult Health

## 2020-02-28 MED ORDER — LISINOPRIL 40 MG PO TABS: 40.0000 mg | ORAL_TABLET | Freq: Every day | ORAL | 3 refills | Status: DC

## 2020-02-29 ENCOUNTER — Other Ambulatory Visit: Payer: Self-pay | Admitting: Adult Health

## 2020-02-29 DIAGNOSIS — F5101 Primary insomnia: Secondary | ICD-10-CM

## 2020-02-29 MED ORDER — ZOLPIDEM TARTRATE 10 MG PO TABS: 10.0000 mg | ORAL_TABLET | Freq: Every evening | ORAL | 2 refills | Status: DC | PRN

## 2020-04-03 ENCOUNTER — Other Ambulatory Visit: Payer: Self-pay | Admitting: Adult Health

## 2020-04-03 DIAGNOSIS — M159 Polyosteoarthritis, unspecified: Secondary | ICD-10-CM

## 2020-04-03 DIAGNOSIS — M8949 Other hypertrophic osteoarthropathy, multiple sites: Secondary | ICD-10-CM

## 2020-04-04 ENCOUNTER — Encounter: Payer: Self-pay | Admitting: Adult Health

## 2020-04-04 NOTE — Telephone Encounter (Signed)
Sent to the pharmacy by e-scribe. 

## 2020-04-04 NOTE — Telephone Encounter (Signed)
I have scheduled the pt to see [facility] on 04/06/20 @ 10:30.  Nothing further needed.

## 2020-04-06 ENCOUNTER — Encounter: Payer: Self-pay | Admitting: Adult Health

## 2020-04-06 ENCOUNTER — Other Ambulatory Visit: Payer: Self-pay

## 2020-04-06 ENCOUNTER — Ambulatory Visit (INDEPENDENT_AMBULATORY_CARE_PROVIDER_SITE_OTHER): Payer: PPO

## 2020-04-06 ENCOUNTER — Ambulatory Visit (INDEPENDENT_AMBULATORY_CARE_PROVIDER_SITE_OTHER): Payer: PPO | Admitting: Adult Health

## 2020-04-06 VITALS — BP 150/84 | Temp 98.2°F | Wt 199.0 lb

## 2020-04-06 DIAGNOSIS — M25511 Pain in right shoulder: Secondary | ICD-10-CM

## 2020-04-06 DIAGNOSIS — M542 Cervicalgia: Secondary | ICD-10-CM

## 2020-04-06 DIAGNOSIS — R600 Localized edema: Secondary | ICD-10-CM | POA: Diagnosis not present

## 2020-04-06 LAB — BASIC METABOLIC PANEL
BUN: 17 mg/dL (ref 6–23)
CO2: 29 mEq/L (ref 19–32)
Calcium: 8.5 mg/dL (ref 8.4–10.5)
Chloride: 108 mEq/L (ref 96–112)
Creatinine, Ser: 1.22 mg/dL (ref 0.40–1.50)
GFR: 59.4 mL/min — ABNORMAL LOW (ref >60.00)
Glucose, Bld: 87 mg/dL (ref 70–99)
Potassium: 4.1 mEq/L (ref 3.5–5.1)
Sodium: 141 mEq/L (ref 135–145)

## 2020-04-06 NOTE — Progress Notes (Signed)
Subjective:    Patient ID: Harold Wilson., male    DOB: 02-Mar-1946, 75 y.o.   MRN: [id number]  HPI 75 year old male who  has a past medical history of BENIGN PROSTATIC HYPERTROPHY (02/06/2007), COLONIC POLYPS, HX OF (02/06/2007), DIVERTICULOSIS, COLON (02/06/2007), ERECTILE DYSFUNCTION, NON-ORGANIC, MILD (02/23/2009), HYPERLIPIDEMIA (02/22/2008), Hypertension, and OSTEOARTHRITIS (02/06/2007).  Is being evaluated today for multiple issues  1.  Bilateral lower extremity edema.  Reports that he drove back from [place] yesterday and noticed that both his feet are swollen.  When he woke up this morning he had significant improvement in the lower extremity edema.  He denies calf pain, redness, warmth, redness of breath, or chest pain.  He did drink more alcohol than he was accustomed on vacation and probably had more sodium than what is his norm.  2. Right shoulder pain -reports that about 10 days ago he slipped on some ice injuring his right shoulder and bicep.  Pain has improved.  History of right shoulder surgery in the past.  Has full range of motion of the right arm, is able to make a biceps without deformity.  Feels as though the pain is probably improving over the last week.  3. Cervical spine pain -history of cervical spine fusion multiple years ago.  For an unknown amount of time he reports some mild discomfort and the feeling of cracking in his neck with certain positions.  Most noticeable when shaving.  The fall that was mentioned above may have exacerbated his issues.   Review of Systems See HPI   Past Medical History:  Diagnosis Date  . BENIGN PROSTATIC HYPERTROPHY 02/06/2007  . COLONIC POLYPS, HX OF 02/06/2007  . DIVERTICULOSIS, COLON 02/06/2007  . ERECTILE DYSFUNCTION, NON-ORGANIC, MILD 02/23/2009  . HYPERLIPIDEMIA 02/22/2008  . Hypertension   . OSTEOARTHRITIS 02/06/2007    Social History   Socioeconomic History  . Marital status: Married    Spouse name: Not on file  .  Number of children: 5  . Years of education: Not on file  . Highest education level: Not on file  Occupational History  . Occupation: retired  Tobacco Use  . Smoking status: Former Games developer  . Smokeless tobacco: Never Used  Substance and Sexual Activity  . Alcohol use: Yes    Alcohol/week: 1.0 standard drink    Types: 1 Cans of beer per week  . Drug use: No  . Sexual activity: Not on file  Other Topics Concern  . Not on file  Social History Narrative   Retired    Married    Social Determinants of Corporate investment banker Strain: Medium Risk  . Difficulty of Paying Living Expenses: Somewhat hard  Food Insecurity: Not on file  Transportation Needs: No Transportation Needs  . Lack of Transportation (Medical): No  . Lack of Transportation (Non-Medical): No  Physical Activity: Not on file  Stress: Not on file  Social Connections: Not on file  Intimate Partner Violence: Not on file    Past Surgical History:  Procedure Laterality Date  . CERVICAL SPINE SURGERY    . ROTATOR CUFF REPAIR    . TEMPOROMANDIBULAR JOINT SURGERY    . TONSILLECTOMY    . VASECTOMY      Family History  Problem Relation Age of Onset  . COPD Mother   . Heart disease Mother   . Heart failure Mother   . Cancer Father        lung ca    Allergies  Allergen Reactions  . Nickel Rash  . Sulfacetamide Sodium     Dryness of mouth     Current Outpatient Medications on File Prior to Visit  Medication Sig Dispense Refill  . cyclobenzaprine (FLEXERIL) 5 MG tablet TAKE ONE TABLET BY MOUTH THREE TIMES A DAY AS NEEDED FOR MUSCLE SPASMS 90 tablet 1  . FLUoxetine (PROZAC) 20 MG capsule TAKE 1 BY MOUTH DAILY 90 capsule 1  . fluticasone (FLONASE) 50 MCG/ACT nasal spray Place 2 sprays into both nostrils daily. 48 g 3  . lisinopril (ZESTRIL) 40 MG tablet Take 1 tablet (40 mg total) by mouth daily. 90 tablet 3  . naproxen (NAPROXEN DR) 500 MG EC tablet Take 1 tablet (500 mg total) by mouth 2 (two) times daily  with a meal. 180 tablet 1  . sildenafil (REVATIO) 20 MG tablet TAKE 2 TABLETS BY MOUTH DAILY AS DIRECTED 150 tablet 0  . tadalafil (CIALIS) 5 MG tablet Take 1 tablet (5 mg total) by mouth daily. 30 tablet 5  . tamsulosin (FLOMAX) 0.4 MG CAPS capsule Take 1 capsule (0.4 mg total) by mouth daily. 90 capsule 3  . zolpidem (AMBIEN) 10 MG tablet Take 1 tablet (10 mg total) by mouth at bedtime as needed. 30 tablet 2  . amLODipine (NORVASC) 10 MG tablet Take 1 tablet (10 mg total) by mouth daily. 180 tablet 3   No current facility-administered medications on file prior to visit.    BP (!) 150/84 Comment: PT HAS NOT TAKEN HIS MEDS  Temp 98.2 F (36.8 C)   Wt 199 lb (90.3 kg)   BMI 29.39 kg/m       Objective:   Physical Exam Vitals and nursing note reviewed.  Constitutional:      Appearance: Normal appearance.  Cardiovascular:     Rate and Rhythm: Normal rate and regular rhythm.     Pulses: Normal pulses.     Heart sounds: Normal heart sounds.     Comments: Very mild pitting edema from ankle to shin bilaterally. Pulmonary:     Effort: Pulmonary effort is normal.     Breath sounds: Normal breath sounds.  Musculoskeletal:        General: No swelling or tenderness. Normal range of motion.     Right shoulder: Normal.     Cervical back: Normal.     Right lower leg: 1+ Pitting Edema present.     Left lower leg: 1+ Pitting Edema present.  Skin:    General: Skin is warm and dry.     Capillary Refill: Capillary refill takes less than 2 seconds.  Neurological:     General: No focal deficit present.     Mental Status: He is alert and oriented to person, place, and time.  Psychiatric:        Mood and Affect: Mood normal.        Thought Content: Thought content normal.        Judgment: Judgment normal.       Assessment & Plan:  1. Lower extremity edema -No signs of DVT.  Likely from diet while on vacation as well as a long car ride.  Dema is improving.  Advised to elevate legs and  drink more water and I would expect edema to resolve. - Basic Metabolic Panel; Future - Basic Metabolic Panel  2. Acute pain of right shoulder  - DG Shoulder Right; Future  3. Cervical spine pain - consider MRI  - DG Cervical Spine Complete; Future  Kandee Keen  Belia Febo, NP

## 2020-04-12 ENCOUNTER — Other Ambulatory Visit: Payer: Self-pay | Admitting: Adult Health

## 2020-04-13 NOTE — Telephone Encounter (Signed)
Sent to the pharmacy by e-scribe. 

## 2020-05-11 NOTE — Progress Notes (Signed)
Cardiology Office Note:   Date:  05/12/2020  NAME:  Harold Wilson.    MRN: [id number] DOB:  [birth year]   PCP:  [name], NP  Cardiologist:  Reatha Harps, MD   Referring MD: [name], NP   Chief Complaint  Patient presents with  . Follow-up   History of Present Illness:   Harold Augustus. is a 75 y.o. male with a hx of HTN, HLD, GERD who presents for follow-up. Seen last year for atypical CP that was attributed to GERD. Normal echo and normal stress.  Reports he is doing well.  Has had no further recurrence of chest pain.  All his tests were normal.  Blood pressure 116/66.  This is well controlled.  He denies any recent medication changes.  Most recent cholesterol level likely acceptable given his age.  He reports he is not exercising but denies any symptoms when getting around town.  No major issues reported.  Problem List 1. GERD 2. HTN 3. HLD -Tchol 135, LDL 86, HDL 35, TG 57  Past Medical History: Past Medical History:  Diagnosis Date  . BENIGN PROSTATIC HYPERTROPHY 02/06/2007  . COLONIC POLYPS, HX OF 02/06/2007  . DIVERTICULOSIS, COLON 02/06/2007  . ERECTILE DYSFUNCTION, NON-ORGANIC, MILD 02/23/2009  . HYPERLIPIDEMIA 02/22/2008  . Hypertension   . OSTEOARTHRITIS 02/06/2007    Past Surgical History: Past Surgical History:  Procedure Laterality Date  . CERVICAL SPINE SURGERY    . ROTATOR CUFF REPAIR    . TEMPOROMANDIBULAR JOINT SURGERY    . TONSILLECTOMY    . VASECTOMY      Current Medications: Current Meds  Medication Sig  . amLODipine (NORVASC) 10 MG tablet Take 1 tablet (10 mg total) by mouth daily.  . cyclobenzaprine (FLEXERIL) 5 MG tablet TAKE ONE TABLET BY MOUTH THREE TIMES A DAY AS NEEDED FOR MUSCLE SPASMS  . FLUoxetine (PROZAC) 20 MG capsule TAKE 1 BY MOUTH DAILY  . fluticasone (FLONASE) 50 MCG/ACT nasal spray Place 2 sprays into both nostrils daily.  Marland Kitchen lisinopril (ZESTRIL) 40 MG tablet Take 1 tablet (40 mg total) by mouth daily.  .  naproxen (NAPROXEN DR) 500 MG EC tablet Take 1 tablet (500 mg total) by mouth 2 (two) times daily with a meal.  . sildenafil (REVATIO) 20 MG tablet TAKE TWO TABLETS BY MOUTH DAILY AS DIRECTED  . tadalafil (CIALIS) 5 MG tablet Take 1 tablet (5 mg total) by mouth daily.  . tamsulosin (FLOMAX) 0.4 MG CAPS capsule Take 1 capsule (0.4 mg total) by mouth daily.  Marland Kitchen zolpidem (AMBIEN) 10 MG tablet Take 1 tablet (10 mg total) by mouth at bedtime as needed.     Allergies:    Nickel and Sulfacetamide sodium   Social History: Social History   Socioeconomic History  . Marital status: Married    Spouse name: Not on file  . Number of children: 5  . Years of education: Not on file  . Highest education level: Not on file  Occupational History  . Occupation: retired  Tobacco Use  . Smoking status: Former Games developer  . Smokeless tobacco: Never Used  Substance and Sexual Activity  . Alcohol use: Yes    Alcohol/week: 1.0 standard drink    Types: 1 Cans of beer per week  . Drug use: No  . Sexual activity: Not on file  Other Topics Concern  . Not on file  Social History Narrative   Retired    Married    Social Determinants of  Health   Financial Resource Strain: Medium Risk  . Difficulty of Paying Living Expenses: Somewhat hard  Food Insecurity: Not on file  Transportation Needs: No Transportation Needs  . Lack of Transportation (Medical): No  . Lack of Transportation (Non-Medical): No  Physical Activity: Not on file  Stress: Not on file  Social Connections: Not on file     Family History: The patient's family history includes COPD in his mother; Cancer in his father; Heart disease in his mother; Heart failure in his mother.  ROS:   All other ROS reviewed and negative. Pertinent positives noted in the HPI.     EKGs/Labs/Other Studies Reviewed:   The following studies were personally reviewed by me today:  TTE 03/25/2019 1. Left ventricular ejection fraction, by visual estimation, is 55  to  60%. The left ventricle has normal function. There is no left ventricular  hypertrophy.  2. The left ventricle demonstrates regional wall motion abnormalities.  3. LVEF is normal with basal inferior hypokinesis.  4. Global right ventricle has normal systolic function.The right  ventricular size is normal. No increase in right ventricular wall  thickness.  5. Left atrial size was normal.  6. Right atrial size was normal.  7. The mitral valve is normal in structure. Trivial mitral valve  regurgitation.  8. The tricuspid valve is normal in structure.  9. The tricuspid valve is normal in structure. Tricuspid valve  regurgitation is trivial.  10. The aortic valve is normal in structure. Aortic valve regurgitation is  not visualized.  11. The pulmonic valve was normal in structure. Pulmonic valve  regurgitation is trivial.  12. The inferior vena cava is normal in size with greater than 50%  respiratory variability, suggesting right atrial pressure of 3 mmHg.   NM Stress 03/18/2019 1. There is a fixed defect present in the apex. The wall motion in this region is normal. Overall, this is consistent with apical thinning artifact.  2. No evidence of ischemia.  3. Mildly reduced EF, 43%.  4. Mildly dilated LV.  5. Intermediate risk study due to LVEF. Recommend correlation with echocardiogram.    Recent Labs: 10/05/2019: ALT 12; Hemoglobin 12.5; Platelets 144; TSH 1.90 04/06/2020: BUN 17; Creatinine, Ser 1.22; Potassium 4.1; Sodium 141   Recent Lipid Panel    Component Value Date/Time   CHOL 135 10/05/2019 0954   TRIG 57 10/05/2019 0954   TRIG 48 01/28/2006 0840   HDL 35 (L) 10/05/2019 0954   CHOLHDL 3.9 10/05/2019 0954   VLDL 15.2 09/24/2018 1106   LDLCALC 86 10/05/2019 0954    Physical Exam:   VS:  BP 116/66 (BP Location: Left Arm, Patient Position: Sitting)   Pulse 88   Ht 5\' 9"  (1.753 m)   Wt 190 lb 3.2 oz (86.3 kg)   SpO2 97%   BMI 28.09 kg/m    Wt Readings from  Last 3 Encounters:  05/12/20 190 lb 3.2 oz (86.3 kg)  04/06/20 199 lb (90.3 kg)  10/05/19 193 lb (87.5 kg)    General: Well nourished, well developed, in no acute distress Head: Atraumatic, normal size  Eyes: PEERLA, EOMI  Neck: Supple, no JVD Endocrine: No thryomegaly Cardiac: Normal S1, S2; RRR; no murmurs, rubs, or gallops Lungs: Clear to auscultation bilaterally, no wheezing, rhonchi or rales  Abd: Soft, nontender, no hepatomegaly  Ext: No edema, pulses 2+ Musculoskeletal: No deformities, BUE and BLE strength normal and equal Skin: Warm and dry, no rashes   Neuro: Alert and oriented to  person, place, time, and situation, CNII-XII grossly intact, no focal deficits  Psych: Normal mood and affect   ASSESSMENT:   Harold Reh. is a 75 y.o. male who presents for the following: 1. Chest pain, unspecified type   2. Essential hypertension   3. Mixed hyperlipidemia     PLAN:   1. Chest pain, unspecified type -Atypical chest pain last year.  Normal echo.  Normal stress test.  No further recurrence.  He can see [place] as needed.  2. Essential hypertension -Well-controlled on current medications.  3. Mixed hyperlipidemia -Acceptable.  Disposition: Return if symptoms worsen or fail to improve.  Medication Adjustments/Labs and Tests Ordered: Current medicines are reviewed at length with the patient today.  Concerns regarding medicines are outlined above.  No orders of the defined types were placed in this encounter.  No orders of the defined types were placed in this encounter.   Patient Instructions  Medication Instructions:  The current medical regimen is effective;  continue present plan and medications.  *If you need a refill on your cardiac medications before your next appointment, please call your pharmacy*  Follow-Up: At [facility], you and your health needs are our priority.  As part of our continuing mission to provide you with exceptional heart care, we have  created designated Provider Care Teams.  These Care Teams include your primary Cardiologist (physician) and Advanced Practice Providers (APPs -  Physician Assistants and Nurse Practitioners) who all work together to provide you with the care you need, when you need it.  We recommend signing up for the patient portal called "MyChart".  Sign up information is provided on this After Visit Summary.  MyChart is used to connect with patients for Virtual Visits (Telemedicine).  Patients are able to view lab/test results, encounter notes, upcoming appointments, etc.  Non-urgent messages can be sent to your provider as well.   To learn more about what you can do with MyChart, go to [email or web address].    Your next appointment:   As needed  The format for your next appointment:   In Person  Provider:   Lennie Odor, MD         Time Spent with Patient: I have spent a total of 25 minutes with patient reviewing hospital notes, telemetry, EKGs, labs and examining the patient as well as establishing an assessment and plan that was discussed with the patient.  > 50% of time was spent in direct patient care.  Signed, Lenna Gilford. Flora Lipps, MD, [facility]  [facility]  [address], [address] [place], [place] [address] [phone]  05/12/2020 11:02 AM

## 2020-05-12 ENCOUNTER — Other Ambulatory Visit: Payer: Self-pay

## 2020-05-12 ENCOUNTER — Encounter: Payer: Self-pay | Admitting: Cardiovascular Disease

## 2020-05-12 ENCOUNTER — Ambulatory Visit: Payer: PPO | Admitting: Cardiovascular Disease

## 2020-05-12 VITALS — BP 116/66 | HR 88 | Ht 69.0 in | Wt 190.2 lb

## 2020-05-12 DIAGNOSIS — I10 Essential (primary) hypertension: Secondary | ICD-10-CM

## 2020-05-12 DIAGNOSIS — R079 Chest pain, unspecified: Secondary | ICD-10-CM | POA: Diagnosis not present

## 2020-05-12 DIAGNOSIS — E782 Mixed hyperlipidemia: Secondary | ICD-10-CM

## 2020-05-12 NOTE — Patient Instructions (Signed)
Medication Instructions:  The current medical regimen is effective;  continue present plan and medications.  *If you need a refill on your cardiac medications before your next appointment, please call your pharmacy*    Follow-Up: At [facility], you and your health needs are our priority.  As part of our continuing mission to provide you with exceptional heart care, we have created designated Provider Care Teams.  These Care Teams include your primary Cardiologist (physician) and Advanced Practice Providers (APPs -  Physician Assistants and Nurse Practitioners) who all work together to provide you with the care you need, when you need it.  We recommend signing up for the patient portal called "MyChart".  Sign up information is provided on this After Visit Summary.  MyChart is used to connect with patients for Virtual Visits (Telemedicine).  Patients are able to view lab/test results, encounter notes, upcoming appointments, etc.  Non-urgent messages can be sent to your provider as well.   To learn more about what you can do with MyChart, go to [email or web address].    Your next appointment:   As needed  The format for your next appointment:   In Person  Provider:   Hooker O'Neal, MD      

## 2020-05-16 ENCOUNTER — Encounter: Payer: Self-pay | Admitting: Adult Health

## 2020-05-16 DIAGNOSIS — F419 Anxiety disorder, unspecified: Secondary | ICD-10-CM

## 2020-05-16 DIAGNOSIS — N401 Enlarged prostate with lower urinary tract symptoms: Secondary | ICD-10-CM

## 2020-05-16 MED ORDER — NAPROXEN 500 MG PO TBEC: 500.0000 mg | DELAYED_RELEASE_TABLET | Freq: Two times a day (BID) | ORAL | 0 refills | Status: DC

## 2020-05-16 MED ORDER — TAMSULOSIN HCL 0.4 MG PO CAPS: 0.4000 mg | ORAL_CAPSULE | Freq: Every day | ORAL | 1 refills | Status: DC

## 2020-05-16 MED ORDER — FLUOXETINE HCL 20 MG PO CAPS: ORAL_CAPSULE | ORAL | 1 refills | Status: DC

## 2020-05-23 ENCOUNTER — Telehealth: Payer: Self-pay | Admitting: Pharmacist

## 2020-05-23 NOTE — Chronic Care Management (AMB) (Signed)
    Chronic Care Management Pharmacy Assistant   Name: Harold Wilson.  MRN: [id number] DOB: Dec 10, 1945  Reason for Encounter: General Adherence Call       Recent office visits:  . 02.03.2022 [name], NP Family Medicine  Recent consult visits:  . 03.11.2022 O'Neal, Ronnald Ramp, MD Cardiology  Hospital visits:  None in previous 6 months  Medications: Outpatient Encounter Medications as of 05/23/2020  Medication Sig  . amLODipine (NORVASC) 10 MG tablet Take 1 tablet (10 mg total) by mouth daily.  . cyclobenzaprine (FLEXERIL) 5 MG tablet TAKE ONE TABLET BY MOUTH THREE TIMES A DAY AS NEEDED FOR MUSCLE SPASMS  . FLUoxetine (PROZAC) 20 MG capsule TAKE 1 BY MOUTH DAILY  . fluticasone (FLONASE) 50 MCG/ACT nasal spray Place 2 sprays into both nostrils daily.  Marland Kitchen lisinopril (ZESTRIL) 40 MG tablet Take 1 tablet (40 mg total) by mouth daily.  . naproxen (NAPROXEN DR) 500 MG EC tablet Take 1 tablet (500 mg total) by mouth 2 (two) times daily with a meal.  . sildenafil (REVATIO) 20 MG tablet TAKE TWO TABLETS BY MOUTH DAILY AS DIRECTED  . tadalafil (CIALIS) 5 MG tablet Take 1 tablet (5 mg total) by mouth daily.  . tamsulosin (FLOMAX) 0.4 MG CAPS capsule Take 1 capsule (0.4 mg total) by mouth daily.  Marland Kitchen zolpidem (AMBIEN) 10 MG tablet Take 1 tablet (10 mg total) by mouth at bedtime as needed.   No facility-administered encounter medications on file as of 05/23/2020.   I spoke with the patient and discussed medication adherence. He does not issue currently with her current medication. The patient states that he has been doing well.  He states he tries to eat more and healthily. She has increased her water intake. He denies ED visits since his last CPP follow-up. He recently followed up with cardiology. There are no changes to his medications and his blood pressure was stable. The patient denies any side effects with his medication. Also, denies any problems with his current pharmacy.The  patient stated that he will call back to make a new CCM appointment.  Berenice Bouton, [facility] Clinical Pharmacy Assistant [phone]

## 2020-05-26 ENCOUNTER — Encounter: Payer: Self-pay | Admitting: Adult Health

## 2020-05-26 ENCOUNTER — Other Ambulatory Visit: Payer: Self-pay | Admitting: Adult Health

## 2020-05-26 DIAGNOSIS — F5101 Primary insomnia: Secondary | ICD-10-CM

## 2020-05-26 NOTE — Telephone Encounter (Signed)
Last filled 02/29/2020 Last OV 04/06/2020  Ok to fill? 

## 2020-05-26 NOTE — Telephone Encounter (Signed)
Last filled 02/29/2020 Last OV 04/06/2020  Ok to fill?

## 2020-05-27 ENCOUNTER — Other Ambulatory Visit: Payer: Self-pay | Admitting: Adult Health

## 2020-05-27 DIAGNOSIS — N401 Enlarged prostate with lower urinary tract symptoms: Secondary | ICD-10-CM

## 2020-05-27 DIAGNOSIS — F528 Other sexual dysfunction not due to a substance or known physiological condition: Secondary | ICD-10-CM

## 2020-06-02 ENCOUNTER — Other Ambulatory Visit: Payer: Self-pay | Admitting: Adult Health

## 2020-06-02 ENCOUNTER — Encounter: Payer: Self-pay | Admitting: Adult Health

## 2020-06-04 ENCOUNTER — Other Ambulatory Visit: Payer: Self-pay | Admitting: Adult Health

## 2020-06-04 DIAGNOSIS — M159 Polyosteoarthritis, unspecified: Secondary | ICD-10-CM

## 2020-06-04 DIAGNOSIS — M8949 Other hypertrophic osteoarthropathy, multiple sites: Secondary | ICD-10-CM

## 2020-06-14 ENCOUNTER — Encounter: Payer: Self-pay | Admitting: Adult Health

## 2020-06-14 ENCOUNTER — Other Ambulatory Visit: Payer: Self-pay | Admitting: Adult Health

## 2020-06-14 DIAGNOSIS — G8929 Other chronic pain: Secondary | ICD-10-CM

## 2020-06-14 DIAGNOSIS — M25511 Pain in right shoulder: Secondary | ICD-10-CM

## 2020-06-20 ENCOUNTER — Encounter: Payer: Self-pay | Admitting: Orthopaedic Surgery

## 2020-06-20 ENCOUNTER — Ambulatory Visit (INDEPENDENT_AMBULATORY_CARE_PROVIDER_SITE_OTHER): Payer: PPO

## 2020-06-20 ENCOUNTER — Ambulatory Visit: Payer: PPO | Admitting: Orthopaedic Surgery

## 2020-06-20 VITALS — Ht 69.0 in | Wt 190.0 lb

## 2020-06-20 DIAGNOSIS — M542 Cervicalgia: Secondary | ICD-10-CM

## 2020-06-20 MED ORDER — PREDNISONE 10 MG (21) PO TBPK: ORAL_TABLET | ORAL | 0 refills | Status: DC

## 2020-06-20 NOTE — Progress Notes (Signed)
Office Visit Note   Patient: Harold Wilson.           Date of Birth: 12-Jul-1945           MRN: [id number] Visit Date: 06/20/2020              Requested by: [name], NP [address] [place],  [place] [address] PCP: [name], NP   Assessment & Plan: Visit Diagnoses:  1. Neck pain     Plan: Impression is cervical radiculopathy with mainly numbness.  Fortunately he is not having any pain or weakness.  He is agreeable to try a Dosepak and he will follow-up with Dr. Danielle Dess if he does not receive any relief.  Follow-Up Instructions: Return if symptoms worsen or fail to improve.   Orders:  Orders Placed This Encounter  Procedures  . XR Cervical Spine 2 or 3 views   Meds ordered this encounter  Medications  . predniSONE (STERAPRED UNI-PAK 21 TAB) 10 MG (21) TBPK tablet    Sig: Take as directed    Dispense:  21 tablet    Refill:  0      Procedures: No procedures performed   Clinical Data: No additional findings.   Subjective: Chief Complaint  Patient presents with  . Neck - Pain    Harold Wilson is a 75 year old gentleman here for evaluation of neck pain and radicular symptoms down the left arm.  He has had prior two-level ACDF by Dr. Danielle Dess.  He endorses constant numbness down his left arm.  Denies any weakness or real pain.  He is taking Flexeril at times.   Review of Systems  Constitutional: Negative.   All other systems reviewed and are negative.    Objective: Vital Signs: Ht 5\' 9"  (1.753 m)   Wt 190 lb (86.2 kg)   BMI 28.06 kg/m   Physical Exam Vitals and nursing note reviewed.  Constitutional:      Appearance: He is well-developed.  HENT:     Head: Normocephalic and atraumatic.  Eyes:     Pupils: Pupils are equal, round, and reactive to light.  Pulmonary:     Effort: Pulmonary effort is normal.  Abdominal:     Palpations: Abdomen is soft.  Musculoskeletal:        General: Normal range of motion.     Cervical back: Neck  supple.  Skin:    General: Skin is warm.  Neurological:     Mental Status: He is alert and oriented to person, place, and time.  Psychiatric:        Behavior: Behavior normal.        Thought Content: Thought content normal.        Judgment: Judgment normal.     Ortho Exam Cervical spine motion is moderately limited.  He has not having any problems with dexterity.  Reflexes are normal.  Negative Spurling's.  Strength and sensation are all intact. Specialty Comments:  No specialty comments available.  Imaging: XR Cervical Spine 2 or 3 views  Result Date: 06/20/2020 Status post two-level ACDF with good fusion.  There is adjacent level disease above and below the fusion.    [name] History: Patient Active Problem List   Diagnosis Date Noted  . Essential hypertension 01/21/2019  . Insomnia 06/02/2012  . ADHD (attention deficit hyperactivity disorder) 11/11/2011  . ERECTILE DYSFUNCTION, NON-ORGANIC, MILD 02/23/2009  . Dyslipidemia 02/22/2008  . DIVERTICULOSIS, COLON 02/06/2007  . BPH (benign prostatic hyperplasia) 02/06/2007  . Osteoarthritis  02/06/2007  . History of colonic polyps 02/06/2007   Past Medical History:  Diagnosis Date  . BENIGN PROSTATIC HYPERTROPHY 02/06/2007  . COLONIC POLYPS, HX OF 02/06/2007  . DIVERTICULOSIS, COLON 02/06/2007  . ERECTILE DYSFUNCTION, NON-ORGANIC, MILD 02/23/2009  . HYPERLIPIDEMIA 02/22/2008  . Hypertension   . OSTEOARTHRITIS 02/06/2007    Family History  Problem Relation Age of Onset  . COPD Mother   . Heart disease Mother   . Heart failure Mother   . Cancer Father        lung ca    Past Surgical History:  Procedure Laterality Date  . CERVICAL SPINE SURGERY    . ROTATOR CUFF REPAIR    . TEMPOROMANDIBULAR JOINT SURGERY    . TONSILLECTOMY    . VASECTOMY     Social History   Occupational History  . Occupation: retired  Tobacco Use  . Smoking status: Former Games developer  . Smokeless tobacco: Never Used  Substance and Sexual Activity   . Alcohol use: Yes    Alcohol/week: 1.0 standard drink    Types: 1 Cans of beer per week  . Drug use: No  . Sexual activity: Not on file

## 2020-06-23 ENCOUNTER — Other Ambulatory Visit: Payer: Self-pay | Admitting: Adult Health

## 2020-07-21 ENCOUNTER — Telehealth: Payer: Self-pay | Admitting: Pharmacist

## 2020-07-21 NOTE — Chronic Care Management (AMB) (Signed)
Chronic Care Management Pharmacy Assistant   Name: Harold Wilson.  MRN: [id number] DOB: October 16, 1945  Reason for Encounter: Disease State/ General Assessment Call.    Conditions to be addressed/monitored: HTN, HLD and Anxiety   Recent office visits:  04/06/20 [name] NP (PCP) - seen for lower extremity edema and other chronic conditions. No medication changes. No follow up noted.   Recent consult visits:  06/20/20 [name] MD (Orthopedic Surgery) - initial consult for neck pain. Referred by PCP. Patient started on prednisone 10mg . Follow up as needed.   05/12/20 07/12/20 MD (Cardiology) - seen for chest pain. Hypertension and hyperlipidemia. No medication changes. Follow up as needed.   Hospital visits:  None in previous 6 months  Medications: Outpatient Encounter Medications as of 07/21/2020  Medication Sig  . amLODipine (NORVASC) 10 MG tablet Take 1 tablet (10 mg total) by mouth daily.  . cyclobenzaprine (FLEXERIL) 5 MG tablet TAKE ONE TABLET BY MOUTH THREE TIMES A DAY AS NEEDED FOR MUSCLE SPASMS  . FLUoxetine (PROZAC) 20 MG capsule TAKE 1 BY MOUTH DAILY  . fluticasone (FLONASE) 50 MCG/ACT nasal spray Place 2 sprays into both nostrils daily.  07/23/2020 lisinopril (ZESTRIL) 40 MG tablet Take 1 tablet (40 mg total) by mouth daily.  . naproxen (NAPROXEN DR) 500 MG EC tablet Take 1 tablet (500 mg total) by mouth 2 (two) times daily with a meal.  . predniSONE (STERAPRED UNI-PAK 21 TAB) 10 MG (21) TBPK tablet Take as directed  . sildenafil (REVATIO) 20 MG tablet TAKE TWO TABLETS BY MOUTH DAILY AS DIRECTED  . tadalafil (CIALIS) 5 MG tablet TAKE ONE TABLET BY MOUTH DAILY  . tamsulosin (FLOMAX) 0.4 MG CAPS capsule Take 1 capsule (0.4 mg total) by mouth daily.  Marland Kitchen zolpidem (AMBIEN) 10 MG tablet TAKE ONE TABLET BY MOUTH EVERY NIGHT AT BEDTIME AS NEEDED   No facility-administered encounter medications on file as of 07/21/2020.    Reviewed chart prior to disease state call.  Spoke with patient regarding BP  Recent Office Vitals: BP Readings from Last 3 Encounters:  05/12/20 116/66  04/06/20 (!) 150/84  10/05/19 124/72   Pulse Readings from Last 3 Encounters:  05/12/20 88  05/10/19 92  03/16/19 81    Wt Readings from Last 3 Encounters:  06/20/20 190 lb (86.2 kg)  05/12/20 190 lb 3.2 oz (86.3 kg)  04/06/20 199 lb (90.3 kg)     Kidney Function Lab Results  Component Value Date/Time   CREATININE 1.22 04/06/2020 10:52 AM   CREATININE 1.29 (H) 10/05/2019 09:54 AM   CREATININE 1.11 09/24/2018 11:06 AM   CREATININE 1.06 03/13/2018 03:49 PM   GFR 59.40 (L) 04/06/2020 10:52 AM   GFRNONAA 55 (L) 10/05/2019 09:54 AM   GFRAA 64 10/05/2019 09:54 AM    BMP Latest Ref Rng & Units 04/06/2020 10/05/2019 09/24/2018  Glucose 70 - 99 mg/dL 87 81 77  BUN 6 - 23 mg/dL 17 15 17   Creatinine 0.40 - 1.50 mg/dL 09/26/2018 ) 8.75  BUN/Creat Ratio 6 - 22 (calc) - 12 -  Sodium 135 - 145 mEq/L 141 142 142  Potassium 3.5 - 5.1 mEq/L 4.1 4.3 4.0  Chloride 96 - 112 mEq/L 108 109 108  CO2 19 - 32 mEq/L 29 26 27   Calcium 8.4 - 10.5 mg/dL 8.5 6.43(P) 9.0    . Current antihypertensive regimen:  o Amlodipine 10mg  - take daily o Lisinopril 40mg  - take daily  . How often are you  checking your Blood Pressure?   . Current home BP readings:   . What recent interventions/DTPs have been made by any provider to improve Blood Pressure control since last CPP Visit: None.   . Any recent hospitalizations or ED visits since last visit with CPP? No  . What diet changes have been made to improve Blood Pressure Control?  o   . What exercise is being done to improve your Blood Pressure Control?  o   Adherence Review: Is the patient currently on ACE/ARB medication? Yes Does the patient have >5 day gap between last estimated fill dates? No  Multiple unsuccessful attempts to reach patient by phone.   Star Rating Drugs:  o Lisinopril 40mg  - last filled 05/25/20 90DS at 05/27/20 CMA  Clinical Pharmacist Assistant [phone]

## 2020-07-24 NOTE — Telephone Encounter (Cosign Needed)
2nd attempt

## 2020-07-25 NOTE — Telephone Encounter (Cosign Needed)
3 attempts

## 2020-07-26 ENCOUNTER — Ambulatory Visit: Payer: PPO | Admitting: Orthopaedic Surgery

## 2020-07-30 ENCOUNTER — Other Ambulatory Visit: Payer: Self-pay | Admitting: Cardiovascular Disease

## 2020-08-02 ENCOUNTER — Encounter: Payer: Self-pay | Admitting: Orthopaedic Surgery

## 2020-08-02 ENCOUNTER — Ambulatory Visit: Payer: PPO | Admitting: Orthopaedic Surgery

## 2020-08-02 ENCOUNTER — Ambulatory Visit (INDEPENDENT_AMBULATORY_CARE_PROVIDER_SITE_OTHER): Payer: PPO

## 2020-08-02 ENCOUNTER — Other Ambulatory Visit: Payer: Self-pay

## 2020-08-02 VITALS — Ht 69.0 in | Wt 190.0 lb

## 2020-08-02 DIAGNOSIS — M25511 Pain in right shoulder: Secondary | ICD-10-CM | POA: Diagnosis not present

## 2020-08-02 DIAGNOSIS — G8929 Other chronic pain: Secondary | ICD-10-CM | POA: Diagnosis not present

## 2020-08-02 MED ORDER — LIDOCAINE HCL 2 % IJ SOLN
2.0000 mL | INTRAMUSCULAR | Status: AC | PRN
  Administered 2020-08-02: 2 mL

## 2020-08-02 MED ORDER — BUPIVACAINE HCL 0.25 % IJ SOLN
2.0000 mL | INTRAMUSCULAR | Status: AC | PRN
  Administered 2020-08-02: 2 mL via INTRA_ARTICULAR

## 2020-08-02 MED ORDER — METHYLPREDNISOLONE ACETATE 40 MG/ML IJ SUSP
40.0000 mg | INTRAMUSCULAR | Status: AC | PRN
  Administered 2020-08-02: 40 mg via INTRA_ARTICULAR

## 2020-08-02 NOTE — Progress Notes (Signed)
Office Visit Note   Patient: Harold Wilson.           Date of Birth: 12-04-1945           MRN: [id number] Visit Date: 08/02/2020              Requested by: [name], NP [address] [place],  [place] [address] PCP: [name], NP   Assessment & Plan: Visit Diagnoses:  1. Chronic right shoulder pain     Plan: Impression is right shoulder subacromial bursitis and biceps tendinitis.  At this point, we have discussed proceeding with subacromial cortisone injection.  If his symptoms do not improve 2 to 3 weeks from now, he will call us and let us know and we will obtain an MR arthrogram of the right shoulder to assess his rotator cuff.  Otherwise, follow-up with [place] as needed.  Follow-Up Instructions: Return if symptoms worsen or fail to improve.   Orders:  Orders Placed This Encounter  Procedures  . Large Joint Inj: R subacromial bursa  . XR Shoulder Right   No orders of the defined types were placed in this encounter.     Procedures: Large Joint Inj: R subacromial bursa on 08/02/2020 9:03 AM Indications: pain Details: 22 G needle Medications: 2 mL lidocaine 2 %; 2 mL bupivacaine 0.25 %; 40 mg methylPREDNISolone acetate 40 MG/ML Outcome: tolerated well, no immediate complications Patient was prepped and draped in the usual sterile fashion.       Clinical Data: No additional findings.   Subjective: Chief Complaint  Patient presents with  . Right Shoulder - Pain    HPI patient is a pleasant 75 year old gentleman who comes in today following an injury to the right shoulder.  2 to 3 months ago, he slipped falling on the posterior lateral aspect of his right shoulder.  He has not had pain to the deltoid and biceps since.  He denies any weakness to the right upper extremity.  Pain is worse when he is picking something up as well as when he is sleeping on the right side.  He has not taken any medication for this.  He denies any paresthesias to the right  upper extremity.  Of note, he is status post right shoulder rotator cuff repair approximately 20 years ago by Dr. Tresa Res.  Doing well until this recent injury.  Review of Systems as detailed in HPI.  All others reviewed and are negative.   Objective: Vital Signs: Ht 5\' 9"  (1.753 m)   Wt 190 lb (86.2 kg)   BMI 28.06 kg/m   Physical Exam well-developed well-nourished gentleman in no acute distress.  Alert and oriented x3.  Ortho Exam examination of the right shoulder reveals full active range of motion in all planes.  He has a positive empty can with 4 out of 5 strength.  4-5 strength with resisted external rotation.  Positive cross body adduction.  No tenderness to [name].  Positive speeds test.  He is neurovascular intact distally.  Specialty Comments:  No specialty comments available.  Imaging: XR Shoulder Right  Result Date: 08/02/2020 X-rays demonstrate [name].  No other acute or structural normalities    [name] History: Patient Active Problem List   Diagnosis Date Noted  . Essential hypertension 01/21/2019  . Insomnia 06/02/2012  . ADHD (attention deficit hyperactivity disorder) 11/11/2011  . ERECTILE DYSFUNCTION, NON-ORGANIC, MILD 02/23/2009  . Dyslipidemia 02/22/2008  . DIVERTICULOSIS, COLON 02/06/2007  . BPH (  benign prostatic hyperplasia) 02/06/2007  . Osteoarthritis 02/06/2007  . History of colonic polyps 02/06/2007   Past Medical History:  Diagnosis Date  . BENIGN PROSTATIC HYPERTROPHY 02/06/2007  . COLONIC POLYPS, HX OF 02/06/2007  . DIVERTICULOSIS, COLON 02/06/2007  . ERECTILE DYSFUNCTION, NON-ORGANIC, MILD 02/23/2009  . HYPERLIPIDEMIA 02/22/2008  . Hypertension   . OSTEOARTHRITIS 02/06/2007    Family History  Problem Relation Age of Onset  . COPD Mother   . Heart disease Mother   . Heart failure Mother   . Cancer Father        lung ca    Past Surgical History:  Procedure Laterality Date  . CERVICAL SPINE SURGERY    . ROTATOR  CUFF REPAIR    . TEMPOROMANDIBULAR JOINT SURGERY    . TONSILLECTOMY    . VASECTOMY     Social History   Occupational History  . Occupation: retired  Tobacco Use  . Smoking status: Former Games developer  . Smokeless tobacco: Never Used  Substance and Sexual Activity  . Alcohol use: Yes    Alcohol/week: 1.0 standard drink    Types: 1 Cans of beer per week  . Drug use: No  . Sexual activity: Not on file

## 2020-08-03 ENCOUNTER — Other Ambulatory Visit: Payer: Self-pay | Admitting: Physician Assistant

## 2020-08-03 MED ORDER — TRAMADOL HCL 50 MG PO TABS: 50.0000 mg | ORAL_TABLET | Freq: Two times a day (BID) | ORAL | 2 refills | Status: DC | PRN

## 2020-08-03 NOTE — Telephone Encounter (Signed)
I completely forgot but I just sent in

## 2020-08-11 ENCOUNTER — Other Ambulatory Visit: Payer: Self-pay | Admitting: Adult Health

## 2020-08-11 DIAGNOSIS — M159 Polyosteoarthritis, unspecified: Secondary | ICD-10-CM

## 2020-08-15 ENCOUNTER — Telehealth: Payer: Self-pay | Admitting: Adult Health

## 2020-08-15 NOTE — Telephone Encounter (Signed)
Left message for patient to call back and schedule Medicare Annual Wellness Visit (AWV) either virtually or in office.   Last AWV 09/16/17  please schedule at anytime with [facility] Nurse Health Advisor 1 or 2   This should be a 45 minute visit.

## 2020-08-21 ENCOUNTER — Encounter: Payer: Self-pay | Admitting: Adult Health

## 2020-08-21 ENCOUNTER — Other Ambulatory Visit: Payer: Self-pay | Admitting: Adult Health

## 2020-08-21 DIAGNOSIS — F5101 Primary insomnia: Secondary | ICD-10-CM

## 2020-08-24 ENCOUNTER — Telehealth: Payer: Self-pay | Admitting: Pharmacist

## 2020-08-24 NOTE — Chronic Care Management (AMB) (Signed)
Chronic Care Management Pharmacy Assistant   Name: Harold Wilson.  MRN: [id number] DOB: [birth year]  Reason for Encounter: Disease State/ General Assessment Call.    Conditions to be addressed/monitored: HTN and HLD  Recent office visits:  None.   Recent consult visits:  08/02/20 Harold Mussel MD (Orthopedic Surgery) -  seen for chronic right shoulder pain. No medication changes. Injections given in office of Harold Wilson , Harold Wilson and methylprednisolone 40mg . Follow up as needed.   Hospital visits:  None in previous 6 months  Medications: Outpatient Encounter Medications as of 08/24/2020  Medication Sig   amLODipine (NORVASC) 10 MG tablet TAKE ONE TABLET BY MOUTH DAILY   cyclobenzaprine (FLEXERIL) 5 MG tablet TAKE ONE TABLET BY MOUTH THREE TIMES A DAY AS NEEDED FOR MUSCLE SPASMS   FLUoxetine (PROZAC) 20 MG capsule TAKE 1 BY MOUTH DAILY   fluticasone (FLONASE) 50 MCG/ACT nasal spray Place 2 sprays into both nostrils daily.   lisinopril (ZESTRIL) 40 MG tablet Take 1 tablet (40 mg total) by mouth daily.   naproxen (NAPROXEN DR) 500 MG EC tablet Take 1 tablet (500 mg total) by mouth 2 (two) times daily with a meal.   predniSONE (STERAPRED UNI-PAK 21 TAB) 10 MG (21) TBPK tablet Take as directed   sildenafil (REVATIO) 20 MG tablet TAKE TWO TABLETS BY MOUTH DAILY AS DIRECTED   tadalafil (CIALIS) 5 MG tablet TAKE ONE TABLET BY MOUTH DAILY   tamsulosin (FLOMAX) 0.4 MG CAPS capsule Take 1 capsule (0.4 mg total) by mouth daily.   traMADol (ULTRAM) 50 MG tablet Take 1-2 tablets (50-100 mg total) by mouth 2 (two) times daily as needed.   zolpidem (AMBIEN) 10 MG tablet TAKE ONE TABLET BY MOUTH EVERY NIGHT AT BEDTIME AS NEEDED   No facility-administered encounter medications on file as of 08/24/2020.    Reviewed chart prior to disease state call. Spoke with patient regarding BP  Recent Office Vitals: BP Readings from Last 3 Encounters:  05/12/20 116/66  04/06/20 (!)  150/84  10/05/19 124/72   Pulse Readings from Last 3 Encounters:  05/12/20 88  05/10/19 92  03/16/19 81    Wt Readings from Last 3 Encounters:  08/02/20 190 lb (86.2 kg)  06/20/20 190 lb (86.2 kg)  05/12/20 190 lb 3.2 oz (86.3 kg)     Kidney Function Lab Results  Component Value Date/Time   CREATININE 1.22 04/06/2020 10:52 AM   CREATININE 1.29 (H) 10/05/2019 09:54 AM   CREATININE 1.11 09/24/2018 11:06 AM   CREATININE 1.06 03/13/2018 03:49 PM   GFR 59.40 (L) 04/06/2020 10:52 AM   GFRNONAA 55 (L) 10/05/2019 09:54 AM   GFRAA 64 10/05/2019 09:54 AM    BMP Latest Ref Rng & Units 04/06/2020 10/05/2019 09/24/2018  Glucose 70 - 99 mg/dL 87 81 77  BUN 6 - 23 mg/dL 17 15 17   Creatinine 0.40 - 1.50 mg/dL 09/26/2018 ) 1.44  BUN/Creat Ratio 6 - 22 (calc) - 12 -  Sodium 135 - 145 mEq/L 141 142 142  Potassium 3.5 - 5.1 mEq/L 4.1 4.3 4.0  Chloride 96 - 112 mEq/L 108 109 108  CO2 19 - 32 mEq/L 29 26 27   Calcium 8.4 - 10.5 mg/dL 8.5 3.15(Q) 9.0    Current antihypertensive regimen:  Amlodipine 10mg  - take daily Lisinopril 40mg  - take daily How often are you checking your Blood Pressure?  Current home BP readings:  What recent interventions/DTPs have been made by any provider to improve Blood  Pressure control since last CPP Visit: None. Any recent hospitalizations or ED visits since last visit with CPP? No What diet changes have been made to improve Blood Pressure Control?   What exercise is being done to improve your Blood Pressure Control?    Adherence Review: Is the patient currently on ACE/ARB medication? Yes Does the patient have >5 day gap between last estimated fill dates? No   Multiple unsuccessful attempts to reach patient by phone  Star Rating Drugs:  Lisinopril 40mg  - last filled 08/21/20 90DS at 08/23/20 CMA  Clinical Pharmacist Assistant [phone]

## 2020-08-25 MED ORDER — ZOLPIDEM TARTRATE 10 MG PO TABS: 10.0000 mg | ORAL_TABLET | Freq: Every evening | ORAL | 2 refills | Status: DC | PRN

## 2020-08-25 NOTE — Addendum Note (Signed)
Addended by: Waymon Amato R on: 08/25/2020 10:49 AM   Modules accepted: Orders

## 2020-08-28 NOTE — Telephone Encounter (Cosign Needed)
3rd attempt

## 2020-09-07 ENCOUNTER — Other Ambulatory Visit: Payer: Self-pay | Admitting: Adult Health

## 2020-09-18 DIAGNOSIS — U071 COVID-19: Secondary | ICD-10-CM

## 2020-09-18 HISTORY — DX: COVID-19: U07.1

## 2020-09-20 ENCOUNTER — Encounter (HOSPITAL_BASED_OUTPATIENT_CLINIC_OR_DEPARTMENT_OTHER): Payer: Self-pay | Admitting: *Deleted

## 2020-09-20 ENCOUNTER — Other Ambulatory Visit: Payer: Self-pay

## 2020-09-20 ENCOUNTER — Emergency Department (HOSPITAL_BASED_OUTPATIENT_CLINIC_OR_DEPARTMENT_OTHER)
Admission: EM | Admit: 2020-09-20 | Discharge: 2020-09-20 | Disposition: A | Discharge status: Home / Self Care | Payer: PPO | Attending: Emergency Medicine | Admitting: Emergency Medicine

## 2020-09-20 DIAGNOSIS — Z5321 Procedure and treatment not carried out due to patient leaving prior to being seen by health care provider: Secondary | ICD-10-CM | POA: Insufficient documentation

## 2020-09-20 DIAGNOSIS — R059 Cough, unspecified: Secondary | ICD-10-CM | POA: Diagnosis not present

## 2020-09-20 DIAGNOSIS — J029 Acute pharyngitis, unspecified: Secondary | ICD-10-CM | POA: Insufficient documentation

## 2020-09-20 NOTE — ED Triage Notes (Signed)
Presents with sore throat, onset last PM, would like to be evaluated for Covid, Denies, fevers, nausea, vomiting or diarrhea, appetite good. Has strong non-productive cough

## 2020-09-21 ENCOUNTER — Telehealth (INDEPENDENT_AMBULATORY_CARE_PROVIDER_SITE_OTHER): Payer: PPO | Admitting: Family Medicine

## 2020-09-21 ENCOUNTER — Encounter: Payer: Self-pay | Admitting: Adult Health

## 2020-09-21 ENCOUNTER — Encounter: Payer: Self-pay | Admitting: Family Medicine

## 2020-09-21 DIAGNOSIS — U071 COVID-19: Secondary | ICD-10-CM

## 2020-09-21 MED ORDER — MOLNUPIRAVIR EUA 200MG CAPSULE: 4.0000 | ORAL_CAPSULE | Freq: Two times a day (BID) | ORAL | 0 refills | Status: AC

## 2020-09-21 MED ORDER — BENZONATATE 100 MG PO CAPS: 100.0000 mg | ORAL_CAPSULE | Freq: Three times a day (TID) | ORAL | 0 refills | Status: DC | PRN

## 2020-09-21 NOTE — Patient Instructions (Addendum)
HOME CARE TIPS:  -I sent the medication(s) we discussed to your pharmacy: Meds ordered this encounter  Medications   molnupiravir EUA 200 mg CAPS    Sig: Take 4 capsules (800 mg total) by mouth 2 (two) times daily for 5 days.    Dispense:  40 capsule    Refill:  0   benzonatate (TESSALON PERLES) 100 MG capsule    Sig: Take 1 capsule (100 mg total) by mouth 3 (three) times daily as needed.    Dispense:  20 capsule    Refill:  0     -I sent in the [place] treatment or referral you requested per our discussion. Please see the information provided below and discuss further with the pharmacist/treatment team.   -can use tylenol if needed for fevers, aches and pains per instructions  -can use nasal saline a few times per day if you have nasal congestion  -stay hydrated, drink plenty of fluids and eat small healthy meals - avoid dairy  -check out the [facility] website for more information on home care, transmission and treatment for COVID19  -follow up with your doctor in 2-3 days unless improving and feeling better  -stay home while sick, except to seek medical care. If you have COVID19, ideally it would be best to stay home for a full 10 days since the onset of symptoms PLUS one day of no fever and feeling better. Wear a good mask that fits snugly (such as N95 or KN95) if around others to reduce the risk of transmission.  It was nice to meet you today, and I really hope you are feeling better soon. I help [facility] out with telemedicine visits on Tuesdays and Thursdays and am available for visits on those days. If you have any concerns or questions following this visit please schedule a follow up visit with your Primary Care doctor or seek care at a local urgent care clinic to avoid delays in care.    Seek in person care or schedule a follow up video visit promptly if your symptoms worsen, new concerns arise or you are not improving with treatment. Call 911 and/or seek emergency care if your  symptoms are severe or life threatening.    Fact Sheet for Patients And Caregivers Emergency Use Authorization (EUA) Of LAGEVRIOT (molnupiravir) capsules For Coronavirus Disease 2019 (COVID-19)  What is the most important information I should know about LAGEVRIO? LAGEVRIO may cause serious side effects, including: ? LAGEVRIO may cause harm to your unborn baby. It is not known if LAGEVRIO will harm your baby if you take LAGEVRIO during pregnancy. o LAGEVRIO is not recommended for use in pregnancy. o LAGEVRIO has not been studied in pregnancy. LAGEVRIO was studied in pregnant animals only. When LAGEVRIO was given to pregnant animals, LAGEVRIO caused harm to their unborn babies. o You and your healthcare provider may decide that you should take LAGEVRIO during pregnancy if there are no other COVID-19 treatment options approved or authorized by the FDA that are accessible or clinically appropriate for you. o If you and your healthcare provider decide that you should take LAGEVRIO during pregnancy, you and your healthcare provider should discuss the known and potential benefits and the potential risks of taking LAGEVRIO during pregnancy. For individuals who are able to become pregnant: ? You should use a reliable method of birth control (contraception) consistently and correctly during treatment with LAGEVRIO and for 4 days after the last dose of LAGEVRIO. Talk to your healthcare provider about reliable birth  control methods. ? Before starting treatment with [facility] your healthcare provider may do a pregnancy test to see if you are pregnant before starting treatment with LAGEVRIO. ? Tell your healthcare provider right away if you become pregnant or think you may be pregnant during treatment with LAGEVRIO. Pregnancy Surveillance Program: ? There is a pregnancy surveillance program for individuals who take LAGEVRIO during pregnancy. The purpose of this program is to collect information about  the health of you and your baby. Talk to your healthcare provider about how to take part in this program. ? If you take LAGEVRIO during pregnancy and you agree to participate in the pregnancy surveillance program and allow your healthcare provider to share your information with [place], then your healthcare provider will report your use of [place] during pregnancy to [place]. by calling [phone] or PeacefulBlog.es. For individuals who are sexually active with partners who are able to become pregnant: ? It is not known if LAGEVRIO can affect sperm. While the risk is regarded as low, animal studies to fully assess the potential for LAGEVRIO to affect the babies of males treated with LAGEVRIO have not been completed. A reliable method of birth control (contraception) should be used consistently and correctly during treatment with LAGEVRIO and for at least 3 months after the last dose. The risk to sperm beyond 3 months is not known. Studies to understand the risk to sperm beyond 3 months are ongoing. Talk to your healthcare provider about reliable birth control methods. Talk to your healthcare provider if you have questions or concerns about how LAGEVRIO may affect sperm. You are being given this fact sheet because your healthcare provider believes it is necessary to provide you with LAGEVRIO for the treatment of adults with mild-to-moderate coronavirus disease 2019 (COVID-19) with positive results of direct SARS-CoV-2 viral testing, and who are at high risk for progression to severe COVID-19 including hospitalization or death, and for whom other COVID-19 treatment options approved or authorized by the FDA are not accessible or clinically appropriate. The U.S. Food and Drug Administration (FDA) has issued an Emergency Use Authorization (EUA) to make LAGEVRIO available during the COVID-19 pandemic (for more details about an EUA please see "What is an  Emergency Use Authorization?" at the end of this document). LAGEVRIO is not an FDA-approved medicine in the [place]. Read this Fact Sheet for information about LAGEVRIO. Talk to your healthcare provider about your options if you have any questions. It is your choice to take LAGEVRIO.  What is COVID-19? COVID-19 is caused by a virus called a coronavirus. You can get COVID-19 through close contact with another person who has the virus. COVID-19 illnesses have ranged from very mild-to-severe, including illness resulting in death. While information so far suggests that most COVID-19 illness is mild, serious illness can happen and may cause some of your other medical conditions to become worse. Older people and people of all ages with severe, long lasting (chronic) medical conditions like heart disease, lung disease and diabetes, for example seem to be at higher risk of being hospitalized for COVID-19.  What is LAGEVRIO? LAGEVRIO is an investigational medicine used to treat mild-to-moderate COVID-19 in adults: ? with positive results of direct SARS-CoV-2 viral testing, and ? who are at high risk for progression to severe COVID-19 including hospitalization or death, and for whom other COVID-19 treatment options approved or authorized by the FDA are not accessible or clinically appropriate. The FDA has authorized the emergency use  of [place] for the treatment of mild-tomoderate COVID-19 in adults under an EUA. For more information on EUA, see the "What is an Emergency Use Authorization (EUA)?" section at the end of this Fact Sheet. LAGEVRIO is not authorized: ? for use in people less than 56 years of age. ? for prevention of COVID-19. ? for people needing hospitalization for COVID-19. ? for use for longer than 5 consecutive days.  What should I tell my healthcare provider before I take LAGEVRIO? Tell your healthcare provider if you: ? Have any allergies ? Are breastfeeding or plan to  breastfeed ? Have any serious illnesses ? Are taking any medicines (prescription, over-the-counter, vitamins, or herbal products).  How do I take LAGEVRIO? ? Take LAGEVRIO exactly as your healthcare provider tells you to take it. ? Take 4 capsules of LAGEVRIO every 12 hours (for example, at 8 am and at 8 pm) ? Take LAGEVRIO for 5 days. It is important that you complete the full 5 days of treatment with LAGEVRIO. Do not stop taking LAGEVRIO before you complete the full 5 days of treatment, even if you feel better. ? Take LAGEVRIO with or without food. ? You should stay in isolation for as long as your healthcare provider tells you to. Talk to your healthcare provider if you are not sure about how to properly isolate while you have COVID-19. ? Swallow LAGEVRIO capsules whole. Do not open, break, or crush the capsules. If you cannot swallow capsules whole, tell your healthcare provider. ? What to do if you miss a dose: o If it has been less than 10 hours since the missed dose, take it as soon as you remember o If it has been more than 10 hours since the missed dose, skip the missed dose and take your dose at the next scheduled time. ? Do not double the dose of LAGEVRIO to make up for a missed dose.  What are the important possible side effects of LAGEVRIO? ? See, "What is the most important information I should know about LAGEVRIO?" ? Allergic Reactions. Allergic reactions can happen in people taking LAGEVRIO, even after only 1 dose. Stop taking LAGEVRIO and call your healthcare provider right away if you get any of the following symptoms of an allergic reaction: o hives o rapid heartbeat o trouble swallowing or breathing o swelling of the mouth, lips, or face o throat tightness o hoarseness o skin rash The most common side effects of LAGEVRIO are: ? diarrhea ? nausea ? dizziness These are not all the possible side effects of LAGEVRIO. Not many people have taken LAGEVRIO. Serious  and unexpected side effects may happen. This medicine is still being studied, so it is possible that all of the risks are not known at this time.  What other treatment choices are there?  Veklury (remdesivir) is FDA-approved as an intravenous (IV) infusion for the treatment of mildto-moderate SHFWY-63 in certain adults and children. Talk with your doctor to see if [place] is appropriate for you. Like LAGEVRIO, FDA may also allow for the emergency use of other medicines to treat people with COVID-19. Go to LacrosseProperties.si for more information. It is your choice to be treated or not to be treated with LAGEVRIO. Should you decide not to take it, it will not change your standard medical care.  What if I am breastfeeding? Breastfeeding is not recommended during treatment with LAGEVRIO and for 4 days after the last dose of LAGEVRIO. If you are breastfeeding or plan to breastfeed, talk  to your healthcare provider about your options and specific situation before taking LAGEVRIO.  How do I report side effects with LAGEVRIO? Contact your healthcare provider if you have any side effects that bother you or do not go away. Report side effects to FDA MedWatch at SmoothHits.hu or call 1-800-FDA-1088 (1- ([phone]).  How should I store [place]? ? Store LAGEVRIO capsules at room temperature between 51F to 83F (20C to 25C). ? Keep LAGEVRIO and all medicines out of the reach of children and pets. How can I learn more about COVID-19? ? Ask your healthcare provider. ? Visit SeekRooms.co.uk ? Contact your local or state public health department. ? Call [place] at [phone] (toll free in the [place] ? Visit [email or web address].com  What Is an Emergency Use Authorization (EUA)? The [place] FDA has made [place] available under an emergency access mechanism called  an Emergency Use Authorization (EUA) The EUA is supported by a [place], broadcasting Health and Human Service (HHS) declaration that circumstances exist to justify emergency use of drugs and biological products during the COVID-19 pandemic. LAGEVRIO for the treatment of mild-to-moderate COVID-19 in adults with positive results of direct SARS-CoV-2 viral testing, who are at high risk for progression to severe COVID-19, including hospitalization or death, and for whom alternative COVID-19 treatment options approved or authorized by FDA are not accessible or clinically appropriate, has not undergone the same type of review as an FDA-approved product. In issuing an EUA under the GXQJJ-94 public health emergency, the FDA has determined, among other things, that based on the total amount of scientific evidence available including data from adequate and well-controlled clinical trials, if available, it is reasonable to believe that the product may be effective for diagnosing, treating, or preventing COVID-19, or a serious or life-threatening disease or condition caused by COVID-19; that the known and potential benefits of the product, when used to diagnose, treat, or prevent such disease or condition, outweigh the known and potential risks of such product; and that there are no adequate, approved, and available alternatives.  All of these criteria must be met to allow for the product to be used in the treatment of patients during the COVID-19 pandemic. The EUA for LAGEVRIO is in effect for the duration of the COVID-19 declaration justifying emergency use of LAGEVRIO, unless terminated or revoked (after which LAGEVRIO may no longer be used under the EUA). For patent information: [email or web address].[email or web address] Copyright  2021-2022 [place] [place], [place] and its affiliates. All rights reserved. [id number] Revised: March 2022

## 2020-09-21 NOTE — Progress Notes (Signed)
Virtual Visit via Video Note  I connected with Harold Wilson  on 09/21/20 at  4:40 PM EDT by a video enabled telemedicine application and verified that I am speaking with the correct person using two identifiers.  Location patient: home, [place] Location provider:work or home office Persons participating in the virtual visit: patient, provider, wife  I discussed the limitations of evaluation and management by telemedicine and the availability of in person appointments. The patient expressed understanding and agreed to proceed.   HPI:  Acute telemedicine visit for Covid19: -Onset: yesterday, home test was positive yesterday -daughter was sick after going to store -Symptoms include: sore throat, nasal congestion, cough, chills -Denies: fever, CP, SOB, NVD, inability to eat/drink/get out of bed -Pertinent past medical history: see below -Pertinent medication allergies:  Allergies  Allergen Reactions   Nickel Rash   Sulfacetamide Sodium     Dryness of mouth   -COVID-19 vaccine status: vaccinated and boosted x 2 -no recent labs  ROS: See pertinent positives and negatives per HPI.  Past Medical History:  Diagnosis Date   BENIGN PROSTATIC HYPERTROPHY 02/06/2007   COLONIC POLYPS, HX OF 02/06/2007   DIVERTICULOSIS, COLON 02/06/2007   ERECTILE DYSFUNCTION, NON-ORGANIC, MILD 02/23/2009   HYPERLIPIDEMIA 02/22/2008   Hypertension    OSTEOARTHRITIS 02/06/2007    Past Surgical History:  Procedure Laterality Date   CERVICAL SPINE SURGERY     ROTATOR CUFF REPAIR     TEMPOROMANDIBULAR JOINT SURGERY     TONSILLECTOMY     VASECTOMY       Current Outpatient Medications:    amLODipine (NORVASC) 10 MG tablet, TAKE ONE TABLET BY MOUTH DAILY, Disp: 90 tablet, Rfl: 2   benzonatate (TESSALON PERLES) 100 MG capsule, Take 1 capsule (100 mg total) by mouth 3 (three) times daily as needed., Disp: 20 capsule, Rfl: 0   cyclobenzaprine (FLEXERIL) 5 MG tablet, TAKE ONE TABLET BY MOUTH THREE TIMES A DAY AS NEEDED  FOR MUSCLE SPASMS, Disp: 90 tablet, Rfl: 1   FLUoxetine (PROZAC) 20 MG capsule, TAKE 1 BY MOUTH DAILY, Disp: 90 capsule, Rfl: 1   fluticasone (FLONASE) 50 MCG/ACT nasal spray, Place 2 sprays into both nostrils daily., Disp: 48 g, Rfl: 3   lisinopril (ZESTRIL) 40 MG tablet, Take 1 tablet (40 mg total) by mouth daily., Disp: 90 tablet, Rfl: 3   molnupiravir EUA 200 mg CAPS, Take 4 capsules (800 mg total) by mouth 2 (two) times daily for 5 days., Disp: 40 capsule, Rfl: 0   naproxen (NAPROXEN DR) 500 MG EC tablet, Take 1 tablet (500 mg total) by mouth 2 (two) times daily with a meal., Disp: 180 tablet, Rfl: 0   sildenafil (REVATIO) 20 MG tablet, TAKE TWO TABLETS BY MOUTH DAILY AS DIRECTED, Disp: 150 tablet, Rfl: 0   tadalafil (CIALIS) 5 MG tablet, TAKE ONE TABLET BY MOUTH DAILY, Disp: 30 tablet, Rfl: 5   tamsulosin (FLOMAX) 0.4 MG CAPS capsule, Take 1 capsule (0.4 mg total) by mouth daily., Disp: 90 capsule, Rfl: 1   traMADol (ULTRAM) 50 MG tablet, Take 1-2 tablets (50-100 mg total) by mouth 2 (two) times daily as needed., Disp: 60 tablet, Rfl: 2   zolpidem (AMBIEN) 10 MG tablet, Take 1 tablet (10 mg total) by mouth at bedtime as needed., Disp: 30 tablet, Rfl: 2  EXAM:  VITALS per patient if applicable:  GENERAL: alert, oriented, appears well and in no acute distress  HEENT: atraumatic, conjunttiva clear, no obvious abnormalities on inspection of external nose and ears  NECK:  normal movements of the head and neck  LUNGS: on inspection no signs of respiratory distress, breathing rate appears normal, no obvious gross SOB, gasping or wheezing  CV: no obvious cyanosis  MS: moves all visible extremities without noticeable abnormality  PSYCH/NEURO: pleasant and cooperative, no obvious depression or anxiety, speech and thought processing grossly intact  ASSESSMENT AND PLAN:  Discussed the following assessment and plan:  COVID-19   Discussed treatment options, ideal treatment window,  potential complications, isolation and precautions for COVID-19.  After lengthy discussion, the patient opted for treatment with molnupiravir due to being higher risk for complications of covid or severe disease and other factors. Discussed EUA status of this drug and the fact that there is preliminary limited knowledge of risks/interactions/side effects per EUA document vs possible benefits and precautions. This information was shared with patient during the visit and also was provided in patient instructions. Also, advised that patient discuss risks/interactions and use with pharmacist/treatment team as well. The patient declined referral for Covid outpatient treatment at this time. The patient did want a prescription for cough, Tessalon Rx sent.  Other symptomatic care measures summarized in patient instructions.  Advised to seek prompt in person care if worsening, new symptoms arise, or if is not improving with treatment. Discussed options for inperson care if PCP office not available. Did let this patient know that I only do telemedicine on Tuesdays and Thursdays for [place]. Advised to schedule follow up visit with PCP or [facility] if any further questions or concerns to avoid delays in care.   I discussed the assessment and treatment plan with the patient. The patient was provided an opportunity to ask questions and all were answered. The patient agreed with the plan and demonstrated an understanding of the instructions.     Terressa Koyanagi, DO

## 2020-09-25 ENCOUNTER — Other Ambulatory Visit: Payer: Self-pay

## 2020-09-25 ENCOUNTER — Encounter: Payer: Self-pay | Admitting: Adult Health

## 2020-09-25 DIAGNOSIS — F419 Anxiety disorder, unspecified: Secondary | ICD-10-CM

## 2020-09-25 DIAGNOSIS — N401 Enlarged prostate with lower urinary tract symptoms: Secondary | ICD-10-CM

## 2020-09-25 DIAGNOSIS — R351 Nocturia: Secondary | ICD-10-CM

## 2020-09-25 MED ORDER — TAMSULOSIN HCL 0.4 MG PO CAPS: 0.4000 mg | ORAL_CAPSULE | Freq: Every day | ORAL | 1 refills | Status: DC

## 2020-09-25 MED ORDER — FLUOXETINE HCL 20 MG PO CAPS
ORAL_CAPSULE | ORAL | 1 refills | Status: DC
Start: 2020-09-25 — End: 2020-09-26

## 2020-09-26 ENCOUNTER — Other Ambulatory Visit: Payer: Self-pay | Admitting: Adult Health

## 2020-09-26 MED ORDER — ALBUTEROL SULFATE HFA 108 (90 BASE) MCG/ACT IN AERS
2.0000 | INHALATION_SPRAY | Freq: Four times a day (QID) | RESPIRATORY_TRACT | 0 refills | Status: DC | PRN
Start: 2020-09-26 — End: 2021-08-22

## 2020-09-26 MED ORDER — FLUOXETINE HCL 20 MG PO CAPS
ORAL_CAPSULE | ORAL | 1 refills | Status: DC
Start: 2020-09-26 — End: 2021-04-09

## 2020-09-26 MED ORDER — TAMSULOSIN HCL 0.4 MG PO CAPS
0.4000 mg | ORAL_CAPSULE | Freq: Every day | ORAL | 1 refills | Status: DC
Start: 2020-09-26 — End: 2021-04-09

## 2020-09-29 ENCOUNTER — Encounter: Payer: Self-pay | Admitting: Adult Health

## 2020-10-03 ENCOUNTER — Encounter: Payer: Self-pay | Admitting: Adult Health

## 2020-10-03 ENCOUNTER — Other Ambulatory Visit: Payer: Self-pay | Admitting: Adult Health

## 2020-10-03 ENCOUNTER — Telehealth (INDEPENDENT_AMBULATORY_CARE_PROVIDER_SITE_OTHER): Payer: PPO | Admitting: Adult Health

## 2020-10-03 DIAGNOSIS — U071 COVID-19: Secondary | ICD-10-CM | POA: Diagnosis not present

## 2020-10-03 MED ORDER — ONDANSETRON HCL 4 MG PO TABS: 4.0000 mg | ORAL_TABLET | Freq: Three times a day (TID) | ORAL | 0 refills | Status: DC | PRN

## 2020-10-03 MED ORDER — BENZONATATE 200 MG PO CAPS: 200.0000 mg | ORAL_CAPSULE | Freq: Three times a day (TID) | ORAL | 1 refills | Status: DC | PRN

## 2020-10-03 MED ORDER — DOXYCYCLINE HYCLATE 100 MG PO CAPS: 100.0000 mg | ORAL_CAPSULE | Freq: Two times a day (BID) | ORAL | 0 refills | Status: DC

## 2020-10-03 NOTE — Progress Notes (Signed)
Virtual Visit via Video Note  I connected with Harold Wilson on 10/03/20 at 10:30 AM EDT by a video enabled telemedicine application and verified that I am speaking with the correct person using two identifiers.  Location patient: home Location provider:work or home office Persons participating in the virtual visit: patient, provider  I discussed the limitations of evaluation and management by telemedicine and the availability of in person appointments. The patient expressed understanding and agreed to proceed.   HPI:  75 year old male who  has a past medical history of BENIGN PROSTATIC HYPERTROPHY (02/06/2007), COLONIC POLYPS, HX OF (02/06/2007), DIVERTICULOSIS, COLON (02/06/2007), ERECTILE DYSFUNCTION, NON-ORGANIC, MILD (02/23/2009), HYPERLIPIDEMIA (02/22/2008), Hypertension, and OSTEOARTHRITIS (02/06/2007).  He is being evaluated today for an acute issue.  Tested positive for COVID-19 about 9 days ago, has completed antiviral therapy.  Reports that he is feeling slightly better but this waxes and wanes.  Continues to have nasal congestion, productive cough and shortness of breath.  Has not experienced any fevers or chills.  ROS: See pertinent positives and negatives per HPI.  Past Medical History:  Diagnosis Date   BENIGN PROSTATIC HYPERTROPHY 02/06/2007   COLONIC POLYPS, HX OF 02/06/2007   DIVERTICULOSIS, COLON 02/06/2007   ERECTILE DYSFUNCTION, NON-ORGANIC, MILD 02/23/2009   HYPERLIPIDEMIA 02/22/2008   Hypertension    OSTEOARTHRITIS 02/06/2007    Past Surgical History:  Procedure Laterality Date   CERVICAL SPINE SURGERY     ROTATOR CUFF REPAIR     TEMPOROMANDIBULAR JOINT SURGERY     TONSILLECTOMY     VASECTOMY      Family History  Problem Relation Age of Onset   COPD Mother    Heart disease Mother    Heart failure Mother    Cancer Father        lung ca       Current Outpatient Medications:    albuterol (VENTOLIN HFA) 108 (90 Base) MCG/ACT inhaler, Inhale 2 puffs into  the lungs every 6 (six) hours as needed for wheezing or shortness of breath., Disp: 8 g, Rfl: 0   amLODipine (NORVASC) 10 MG tablet, TAKE ONE TABLET BY MOUTH DAILY, Disp: 90 tablet, Rfl: 2   cyclobenzaprine (FLEXERIL) 5 MG tablet, TAKE ONE TABLET BY MOUTH THREE TIMES A DAY AS NEEDED FOR MUSCLE SPASMS, Disp: 90 tablet, Rfl: 1   doxycycline (VIBRAMYCIN) 100 MG capsule, Take 1 capsule (100 mg total) by mouth 2 (two) times daily., Disp: 14 capsule, Rfl: 0   FLUoxetine (PROZAC) 20 MG capsule, TAKE 1 BY MOUTH DAILY, Disp: 90 capsule, Rfl: 1   fluticasone (FLONASE) 50 MCG/ACT nasal spray, Place 2 sprays into both nostrils daily., Disp: 48 g, Rfl: 3   lisinopril (ZESTRIL) 40 MG tablet, Take 1 tablet (40 mg total) by mouth daily., Disp: 90 tablet, Rfl: 3   naproxen (NAPROXEN DR) 500 MG EC tablet, Take 1 tablet (500 mg total) by mouth 2 (two) times daily with a meal., Disp: 180 tablet, Rfl: 0   sildenafil (REVATIO) 20 MG tablet, TAKE TWO TABLETS BY MOUTH DAILY AS DIRECTED, Disp: 150 tablet, Rfl: 0   tadalafil (CIALIS) 5 MG tablet, TAKE ONE TABLET BY MOUTH DAILY, Disp: 30 tablet, Rfl: 5   tamsulosin (FLOMAX) 0.4 MG CAPS capsule, Take 1 capsule (0.4 mg total) by mouth daily., Disp: 90 capsule, Rfl: 1   traMADol (ULTRAM) 50 MG tablet, Take 1-2 tablets (50-100 mg total) by mouth 2 (two) times daily as needed., Disp: 60 tablet, Rfl: 2   zolpidem (AMBIEN) 10 MG  tablet, Take 1 tablet (10 mg total) by mouth at bedtime as needed., Disp: 30 tablet, Rfl: 2   benzonatate (TESSALON) 200 MG capsule, Take 1 capsule (200 mg total) by mouth 3 (three) times daily as needed., Disp: 30 capsule, Rfl: 1  EXAM:  VITALS per patient if applicable:  GENERAL: alert, oriented, appears well and in no acute distress  HEENT: atraumatic, conjunttiva clear, no obvious abnormalities on inspection of external nose and ears  NECK: normal movements of the head and neck  LUNGS: on inspection no signs of respiratory distress, breathing  rate appears normal, no obvious gross SOB, gasping or wheezing  CV: no obvious cyanosis  MS: moves all visible extremities without noticeable abnormality  PSYCH/NEURO: pleasant and cooperative, no obvious depression or anxiety, speech and thought processing grossly intact  ASSESSMENT AND PLAN:  Discussed the following assessment and plan:  1. COVID-19 -We will cover for secondary bacterial infection with doxycycline.  Tessalon Perles worked well for him so I will refill these.  Advise follow-up as needed - benzonatate (TESSALON) 200 MG capsule; Take 1 capsule (200 mg total) by mouth 3 (three) times daily as needed.  Dispense: 30 capsule; Refill: 1 - doxycycline (VIBRAMYCIN) 100 MG capsule; Take 1 capsule (100 mg total) by mouth 2 (two) times daily.  Dispense: 14 capsule; Refill: 0  2. Cough  - benzonatate (TESSALON) 200 MG capsule; Take 1 capsule (200 mg total) by mouth 3 (three) times daily as needed.  Dispense: 30 capsule; Refill: 1    I discussed the assessment and treatment plan with the patient. The patient was provided an opportunity to ask questions and all were answered. The patient agreed with the plan and demonstrated an understanding of the instructions.   The patient was advised to call back or seek an in-person evaluation if the symptoms worsen or if the condition fails to improve as anticipated.   [name], NP

## 2020-10-03 NOTE — Telephone Encounter (Signed)
Pt has been scheduled for Video visit.

## 2020-10-10 ENCOUNTER — Other Ambulatory Visit: Payer: Self-pay

## 2020-10-10 ENCOUNTER — Ambulatory Visit (INDEPENDENT_AMBULATORY_CARE_PROVIDER_SITE_OTHER): Payer: PPO

## 2020-10-10 DIAGNOSIS — Z0000 Encounter for general adult medical examination without abnormal findings: Secondary | ICD-10-CM

## 2020-10-10 NOTE — Patient Instructions (Signed)
Mr. Harold Wilson , Thank you for taking time to come for your Medicare Wellness Visit. I appreciate your ongoing commitment to your health goals. Please review the following plan we discussed and let me know if I can assist you in the future.   Screening recommendations/referrals: Colonoscopy: Done 08/11/13 repeat in 10 years due 08/12/23 Recommended yearly ophthalmology/optometry visit for glaucoma screening and checkup Recommended yearly dental visit for hygiene and checkup  Vaccinations: Influenza vaccine: Due  Pneumococcal vaccine: Completed  Tdap vaccine: Done 09/07/11 repeat every 10 years 09/06/21 Shingles vaccine: Completed 9/14, 01/26/19   Covid-19: Completed 2/9, 3/2, 11/10/19 & 06/02/20  Advanced directives: Please bring a copy of your health care power of attorney and living will to the office at your convenience.  Conditions/risks identified: Lose weight and stay healthy  Next appointment: Follow up in one year for your annual wellness visit.   Preventive Care 18 Years and Older, Male Preventive care refers to lifestyle choices and visits with your health care provider that can promote health and wellness. What does preventive care include? A yearly physical exam. This is also called an annual well check. Dental exams once or twice a year. Routine eye exams. Ask your health care provider how often you should have your eyes checked. Personal lifestyle choices, including: Daily care of your teeth and gums. Regular physical activity. Eating a healthy diet. Avoiding tobacco and drug use. Limiting alcohol use. Practicing safe sex. Taking low doses of aspirin every day. Taking vitamin and mineral supplements as recommended by your health care provider. What happens during an annual well check? The services and screenings done by your health care provider during your annual well check will depend on your age, overall health, lifestyle risk factors, and family history of  disease. Counseling  Your health care provider may ask you questions about your: Alcohol use. Tobacco use. Drug use. Emotional well-being. Home and relationship well-being. Sexual activity. Eating habits. History of falls. Memory and ability to understand (cognition). Work and work Astronomer. Screening  You may have the following tests or measurements: Height, weight, and BMI. Blood pressure. Lipid and cholesterol levels. These may be checked every 5 years, or more frequently if you are over 39 years old. Skin check. Lung cancer screening. You may have this screening every year starting at age 24 if you have a 30-pack-year history of smoking and currently smoke or have quit within the past 15 years. Fecal occult blood test (FOBT) of the stool. You may have this test every year starting at age 20. Flexible sigmoidoscopy or colonoscopy. You may have a sigmoidoscopy every 5 years or a colonoscopy every 10 years starting at age 55. Prostate cancer screening. Recommendations will vary depending on your family history and other risks. Hepatitis C blood test. Hepatitis B blood test. Sexually transmitted disease (STD) testing. Diabetes screening. This is done by checking your blood sugar (glucose) after you have not eaten for a while (fasting). You may have this done every 1-3 years. Abdominal aortic aneurysm (AAA) screening. You may need this if you are a current or former smoker. Osteoporosis. You may be screened starting at age 16 if you are at high risk. Talk with your health care provider about your test results, treatment options, and if necessary, the need for more tests. Vaccines  Your health care provider may recommend certain vaccines, such as: Influenza vaccine. This is recommended every year. Tetanus, diphtheria, and acellular pertussis (Tdap, Td) vaccine. You may need a Td booster every 10  years. Zoster vaccine. You may need this after age 4. Pneumococcal 13-valent  conjugate (PCV13) vaccine. One dose is recommended after age 64. Pneumococcal polysaccharide (PPSV23) vaccine. One dose is recommended after age 80. Talk to your health care provider about which screenings and vaccines you need and how often you need them. This information is not intended to replace advice given to you by your health care provider. Make sure you discuss any questions you have with your health care provider. Document Released: 03/17/2015 Document Revised: 11/08/2015 Document Reviewed: 12/20/2014 Elsevier Interactive Patient Education  2017 [place] Prevention in the Home Falls can cause injuries. They can happen to people of all ages. There are many things you can do to make your home safe and to help prevent falls. What can I do on the outside of my home? Regularly fix the edges of walkways and driveways and fix any cracks. Remove anything that might make you trip as you walk through a door, such as a raised step or threshold. Trim any bushes or trees on the path to your home. Use bright outdoor lighting. Clear any walking paths of anything that might make someone trip, such as rocks or tools. Regularly check to see if handrails are loose or broken. Make sure that both sides of any steps have handrails. Any raised decks and porches should have guardrails on the edges. Have any leaves, snow, or ice cleared regularly. Use sand or salt on walking paths during winter. Clean up any spills in your garage right away. This includes oil or grease spills. What can I do in the bathroom? Use night lights. Install grab bars by the toilet and in the tub and shower. Do not use towel bars as grab bars. Use non-skid mats or decals in the tub or shower. If you need to sit down in the shower, use a plastic, non-slip stool. Keep the floor dry. Clean up any water that spills on the floor as soon as it happens. Remove soap buildup in the tub or shower regularly. Attach bath mats  securely with double-sided non-slip rug tape. Do not have throw rugs and other things on the floor that can make you trip. What can I do in the bedroom? Use night lights. Make sure that you have a light by your bed that is easy to reach. Do not use any sheets or blankets that are too big for your bed. They should not hang down onto the floor. Have a firm chair that has side arms. You can use this for support while you get dressed. Do not have throw rugs and other things on the floor that can make you trip. What can I do in the kitchen? Clean up any spills right away. Avoid walking on wet floors. Keep items that you use a lot in easy-to-reach places. If you need to reach something above you, use a strong step stool that has a grab bar. Keep electrical cords out of the way. Do not use floor polish or wax that makes floors slippery. If you must use wax, use non-skid floor wax. Do not have throw rugs and other things on the floor that can make you trip. What can I do with my stairs? Do not leave any items on the stairs. Make sure that there are handrails on both sides of the stairs and use them. Fix handrails that are broken or loose. Make sure that handrails are as long as the stairways. Check any carpeting to make sure  that it is firmly attached to the stairs. Fix any carpet that is loose or worn. Avoid having throw rugs at the top or bottom of the stairs. If you do have throw rugs, attach them to the floor with carpet tape. Make sure that you have a light switch at the top of the stairs and the bottom of the stairs. If you do not have them, ask someone to add them for you. What else can I do to help prevent falls? Wear shoes that: Do not have high heels. Have rubber bottoms. Are comfortable and fit you well. Are closed at the toe. Do not wear sandals. If you use a stepladder: Make sure that it is fully opened. Do not climb a closed stepladder. Make sure that both sides of the stepladder  are locked into place. Ask someone to hold it for you, if possible. Clearly mark and make sure that you can see: Any grab bars or handrails. First and last steps. Where the edge of each step is. Use tools that help you move around (mobility aids) if they are needed. These include: Canes. Walkers. Scooters. Crutches. Turn on the lights when you go into a dark area. Replace any light bulbs as soon as they burn out. Set up your furniture so you have a clear path. Avoid moving your furniture around. If any of your floors are uneven, fix them. If there are any pets around you, be aware of where they are. Review your medicines with your doctor. Some medicines can make you feel dizzy. This can increase your chance of falling. Ask your doctor what other things that you can do to help prevent falls. This information is not intended to replace advice given to you by your health care provider. Make sure you discuss any questions you have with your health care provider. Document Released: 12/15/2008 Document Revised: 07/27/2015 Document Reviewed: 03/25/2014 Elsevier Interactive Patient Education  2017 [employer].

## 2020-10-10 NOTE — Progress Notes (Signed)
Virtual Visit via Telephone Note  I connected with  Harold CellarJames B Janowski Jr. on 10/10/20 at  1:45 PM EDT by telephone and verified that I am speaking with the correct person using two identifiers.  Location: Patient: Home Provider: Office Persons participating in the virtual visit: patient/Nurse Health Advisor   I discussed the limitations, risks, security and privacy concerns of performing an evaluation and management service by telephone and the availability of in person appointments. The patient expressed understanding and agreed to proceed.  Interactive audio and video telecommunications were attempted between this nurse and patient, however failed, due to patient having technical difficulties OR patient did not have access to video capability.  We continued and completed visit with audio only.  Some vital signs may be absent or patient reported.   Marzella Schleinina H Loras Grieshop, LPN   Subjective:   Harold CellarJames B Stinger Jr. is a 75 y.o. male who presents for Medicare Annual/Subsequent preventive examination.  Review of Systems     Cardiac Risk Factors include: advanced age (>455men, 68>65 women);hypertension;dyslipidemia;male gender     Objective:    Today's Vitals   10/10/20 1335  PainSc: 8    There is no height or weight on file to calculate BMI.  Advanced Directives 10/10/2020  Does Patient Have a Medical Advance Directive? Yes  Type of Estate agentAdvance Directive Healthcare Power of [place];Living will  Copy of Healthcare Power of Attorney in Chart? No - copy requested    Current Medications (verified) Outpatient Encounter Medications as of 10/10/2020  Medication Sig   albuterol (VENTOLIN HFA) 108 (90 Base) MCG/ACT inhaler Inhale 2 puffs into the lungs every 6 (six) hours as needed for wheezing or shortness of breath.   amLODipine (NORVASC) 10 MG tablet TAKE ONE TABLET BY MOUTH DAILY   benzonatate (TESSALON) 200 MG capsule Take 1 capsule (200 mg total) by mouth 3 (three) times daily as needed.    cyclobenzaprine (FLEXERIL) 5 MG tablet TAKE ONE TABLET BY MOUTH THREE TIMES A DAY AS NEEDED FOR MUSCLE SPASMS   doxycycline (VIBRAMYCIN) 100 MG capsule Take 1 capsule (100 mg total) by mouth 2 (two) times daily.   FLUoxetine (PROZAC) 20 MG capsule TAKE 1 BY MOUTH DAILY   fluticasone (FLONASE) 50 MCG/ACT nasal spray Place 2 sprays into both nostrils daily.   lisinopril (ZESTRIL) 40 MG tablet Take 1 tablet (40 mg total) by mouth daily.   naproxen (NAPROXEN DR) 500 MG EC tablet Take 1 tablet (500 mg total) by mouth 2 (two) times daily with a meal.   ondansetron (ZOFRAN) 4 MG tablet Take 1 tablet (4 mg total) by mouth every 8 (eight) hours as needed for nausea or vomiting.   sildenafil (REVATIO) 20 MG tablet TAKE TWO TABLETS BY MOUTH DAILY AS DIRECTED   tadalafil (CIALIS) 5 MG tablet TAKE ONE TABLET BY MOUTH DAILY   tamsulosin (FLOMAX) 0.4 MG CAPS capsule Take 1 capsule (0.4 mg total) by mouth daily.   traMADol (ULTRAM) 50 MG tablet Take 1-2 tablets (50-100 mg total) by mouth 2 (two) times daily as needed.   zolpidem (AMBIEN) 10 MG tablet Take 1 tablet (10 mg total) by mouth at bedtime as needed.   No facility-administered encounter medications on file as of 10/10/2020.    Allergies (verified) Nickel and Sulfacetamide sodium   History: Past Medical History:  Diagnosis Date   BENIGN PROSTATIC HYPERTROPHY 02/06/2007   COLONIC POLYPS, HX OF 02/06/2007   COVID 09/18/2020   per pt   DIVERTICULOSIS, COLON 02/06/2007   ERECTILE  DYSFUNCTION, NON-ORGANIC, MILD 02/23/2009   HYPERLIPIDEMIA 02/22/2008   Hypertension    OSTEOARTHRITIS 02/06/2007   Past Surgical History:  Procedure Laterality Date   CERVICAL SPINE SURGERY     ROTATOR CUFF REPAIR     TEMPOROMANDIBULAR JOINT SURGERY     TONSILLECTOMY     VASECTOMY     Family History  Problem Relation Age of Onset   COPD Mother    Heart disease Mother    Heart failure Mother    Cancer Father        lung ca   Social History   Socioeconomic  History   Marital status: Married    Spouse name: Not on file   Number of children: 5   Years of education: Not on file   Highest education level: Not on file  Occupational History   Occupation: retired  Tobacco Use   Smoking status: Former   Smokeless tobacco: Never  Substance and Sexual Activity   Alcohol use: Yes    Alcohol/week: 1.0 standard drink    Types: 1 Cans of beer per week   Drug use: No   Sexual activity: Not on file  Other Topics Concern   Not on file  Social History Narrative   Retired    Married    Social Determinants of Corporate investment banker Strain: Low Risk    Difficulty of Paying Living Expenses: Not hard at all  Food Insecurity: No Food Insecurity   Worried About Programme researcher, broadcasting/film/video in the Last Year: Never true   Barista in the Last Year: Never true  Transportation Needs: No Transportation Needs   Lack of Transportation (Medical): No   Lack of Transportation (Non-Medical): No  Physical Activity: Sufficiently Active   Days of Exercise per Week: 3 days   Minutes of Exercise per Session: 60 min  Stress: No Stress Concern Present   Feeling of Stress : Not at all  Social Connections: Moderately Integrated   Frequency of Communication with Friends and Family: More than three times a week   Frequency of Social Gatherings with Friends and Family: More than three times a week   Attends Religious Services: Never   Database administrator or Organizations: Yes   Attends Engineer, structural: 1 to 4 times per year   Marital Status: Married    Tobacco Counseling Counseling given: Not Answered   Clinical Intake:  Pre-visit preparation completed: Yes  Pain : 0-10 Pain Score: 8  Pain Type: Chronic pain Pain Location: Shoulder Pain Orientation: Right Pain Descriptors / Indicators: Squeezing, Sharp Pain Onset: More than a month ago Pain Frequency: Intermittent     BMI - recorded: 28.05 Nutritional Status: BMI 25 -29  Overweight Nutritional Risks: None Diabetes: No  How often do you need to have someone help you when you read instructions, pamphlets, or other written materials from your doctor or pharmacy?: 1 - Never  Diabetic?No  Interpreter Needed?: No  Information entered by :: Lanier Ensign, LPN   Activities of Daily Living In your present state of health, do you have any difficulty performing the following activities: 10/10/2020  Hearing? Y  Comment weras hearing aids  Vision? N  Difficulty concentrating or making decisions? Y  Comment at times  Walking or climbing stairs? N  Dressing or bathing? N  Doing errands, shopping? N  Preparing Food and eating ? N  Using the Toilet? N  In the past six months, have you accidently leaked  urine? N  Do you have problems with loss of bowel control? N  Managing your Medications? N  Managing your Finances? N  Housekeeping or managing your Housekeeping? N  Some recent data might be hidden    Patient Care Team: [name], NP as PCP - General (Family Medicine) O'Neal, Ronnald Ramp, MD as PCP - Cardiology (Cardiology) Lutricia Feil, [facility] as Pharmacist (Pharmacist)  Indicate any recent Medical Services you may have received from other than [facility] providers in the past year (date may be approximate).     Assessment:   This is a routine wellness examination for [place].  Hearing/Vision screen Hearing Screening - Comments:: Pt stated loss of hearing and pt wears hearing aids  Vision Screening - Comments:: Pt follows up with [facility] for annual eye exams   Dietary issues and exercise activities discussed: Current Exercise Habits: Home exercise routine, Type of exercise: walking, Time (Minutes): 60, Frequency (Times/Week): 3, Weekly Exercise (Minutes/Week): 180   Goals Addressed             This Visit's Progress    Patient Stated       Lose weight and stay healthy        Depression Screen PHQ 2/9 Scores 10/10/2020 10/05/2019  09/16/2017 07/10/2016 09/01/2015 05/09/2014 04/22/2013  PHQ - 2 Score 0 0 0 0 0 0 0    Fall Risk Fall Risk  10/10/2020 10/05/2019 09/16/2017 07/10/2016 09/01/2015  Falls in the past year? 1 0 No No No  Number falls in past yr: 1 - - - -  Injury with Fall? 1 - - - -  Comment right shoulder - - - -  Risk for fall due to : Impaired vision - - - -  Follow up Falls prevention discussed - - - -    FALL RISK PREVENTION PERTAINING TO THE HOME:  Any stairs in or around the home? Yes  If so, are there any without handrails? No  Home free of loose throw rugs in walkways, pet beds, electrical cords, etc? Yes  Adequate lighting in your home to reduce risk of falls? Yes   ASSISTIVE DEVICES UTILIZED TO PREVENT FALLS:  Life alert? No  Use of a cane, walker or w/c? No  Grab bars in the bathroom? Yes  Shower chair or bench in shower? Yes  Elevated toilet seat or a handicapped toilet? Yes   TIMED UP AND GO:  Was the test performed? No .   Cognitive Function:     6CIT Screen 10/10/2020  What Year? 0 points  What month? 0 points  What time? 0 points  Count back from 20 0 points  Months in reverse 0 points  Repeat phrase 0 points  Total Score 0    Immunizations Immunization History  Administered Date(s) Administered   Influenza Whole 12/01/2012   Influenza, High Dose Seasonal PF 12/14/2013, 11/05/2017, 10/25/2018, 11/10/2019   Influenza-Unspecified 11/29/2016   PFIZER(Purple Top)SARS-COV-2 Vaccination 04/13/2019, 05/04/2019, 11/10/2019, 06/02/2020   Pneumococcal Conjugate-13 04/22/2013, 11/05/2017   Pneumococcal Polysaccharide-23 05/09/2014, 11/16/2018   Tdap 09/07/2011   Zoster Recombinat (Shingrix) 11/16/2018, 01/26/2019    TDAP status: Up to date  Flu Vaccine status: Due, Education has been provided regarding the importance of this vaccine. Advised may receive this vaccine at local pharmacy or Health Dept. Aware to provide a copy of the vaccination record if obtained from local pharmacy or  Health Dept. Verbalized acceptance and understanding.  Pneumococcal vaccine status: Up to date  Covid-19 vaccine status: Completed vaccines  Qualifies for Shingles Vaccine? Yes   Zostavax completed No   Shingrix Completed?: Yes  Screening Tests Health Maintenance  Topic Date Due   INFLUENZA VACCINE  10/02/2020   TETANUS/TDAP  09/06/2021   COLONOSCOPY (Pts 45-75yrs Insurance coverage will need to be confirmed)  08/12/2023   COVID-19 Vaccine  Completed   Hepatitis C Screening  Completed   PNA vac Low Risk Adult  Completed   Zoster Vaccines- Shingrix  Completed   HPV VACCINES  Aged Out    Health Maintenance  Health Maintenance Due  Topic Date Due   INFLUENZA VACCINE  10/02/2020    Colorectal cancer screening: Type of screening: Colonoscopy. Completed 08/11/13. Repeat every 10 years  Additional Screening:  Hepatitis C Screening:  Completed 10/14/18  Vision Screening: Recommended annual ophthalmology exams for early detection of glaucoma and other disorders of the eye. Is the patient up to date with their annual eye exam?  Yes  Who is the provider or what is the name of the office in which the patient attends annual eye exams? Dr Jimmey Ralph  If pt is not established with a provider, would they like to be referred to a provider to establish care? No .   Dental Screening: Recommended annual dental exams for proper oral hygiene  Community Resource Referral / Chronic Care Management: CRR required this visit?  No   CCM required this visit?  No      Plan:     I have personally reviewed and noted the following in the patient's chart:   Medical and social history Use of alcohol, tobacco or illicit drugs  Current medications and supplements including opioid prescriptions. Patient is currently taking opioid prescriptions. Information provided to patient regarding non-opioid alternatives. Patient advised to discuss non-opioid treatment plan with their provider. Functional ability  and status Nutritional status Physical activity Advanced directives List of other physicians Hospitalizations, surgeries, and ER visits in previous 12 months Vitals Screenings to include cognitive, depression, and falls Referrals and appointments  In addition, I have reviewed and discussed with patient certain preventive protocols, quality metrics, and best practice recommendations. A written personalized care plan for preventive services as well as general preventive health recommendations were provided to patient.     Marzella Schlein, LPN   06/09/5460   Nurse Notes: None

## 2020-10-23 ENCOUNTER — Other Ambulatory Visit: Payer: Self-pay

## 2020-10-24 ENCOUNTER — Ambulatory Visit (INDEPENDENT_AMBULATORY_CARE_PROVIDER_SITE_OTHER): Payer: PPO | Admitting: Adult Health

## 2020-10-24 ENCOUNTER — Encounter: Payer: Self-pay | Admitting: Adult Health

## 2020-10-24 VITALS — BP 128/72 | HR 96 | Temp 98.5°F | Ht 68.75 in | Wt 187.0 lb

## 2020-10-24 DIAGNOSIS — F5101 Primary insomnia: Secondary | ICD-10-CM

## 2020-10-24 DIAGNOSIS — M8949 Other hypertrophic osteoarthropathy, multiple sites: Secondary | ICD-10-CM | POA: Diagnosis not present

## 2020-10-24 DIAGNOSIS — R351 Nocturia: Secondary | ICD-10-CM

## 2020-10-24 DIAGNOSIS — I10 Essential (primary) hypertension: Secondary | ICD-10-CM | POA: Diagnosis not present

## 2020-10-24 DIAGNOSIS — F419 Anxiety disorder, unspecified: Secondary | ICD-10-CM | POA: Diagnosis not present

## 2020-10-24 DIAGNOSIS — N401 Enlarged prostate with lower urinary tract symptoms: Secondary | ICD-10-CM

## 2020-10-24 DIAGNOSIS — Z0000 Encounter for general adult medical examination without abnormal findings: Secondary | ICD-10-CM

## 2020-10-24 DIAGNOSIS — M159 Polyosteoarthritis, unspecified: Secondary | ICD-10-CM

## 2020-10-24 LAB — CBC WITH DIFFERENTIAL/PLATELET
Basophils Absolute: 0 10*3/uL (ref 0.0–0.1)
Basophils Relative: 0.7 % (ref 0.0–3.0)
Eosinophils Absolute: 0.2 10*3/uL (ref 0.0–0.7)
Eosinophils Relative: 3.7 % (ref 0.0–5.0)
HCT: 39.2 % (ref 39.0–52.0)
Hemoglobin: 13 g/dL (ref 13.0–17.0)
Lymphocytes Relative: 18.5 % (ref 12.0–46.0)
Lymphs Abs: 0.8 10*3/uL (ref 0.7–4.0)
MCHC: 33.3 g/dL (ref 30.0–36.0)
MCV: 89.1 fl (ref 78.0–100.0)
Monocytes Absolute: 0.4 10*3/uL (ref 0.1–1.0)
Monocytes Relative: 8.2 % (ref 3.0–12.0)
Neutro Abs: 3.1 10*3/uL (ref 1.4–7.7)
Neutrophils Relative %: 68.9 % (ref 43.0–77.0)
Platelets: 141 10*3/uL — ABNORMAL LOW (ref 150.0–400.0)
RBC: 4.39 Mil/uL (ref 4.22–5.81)
RDW: 13.6 % (ref 11.5–15.5)
WBC: 4.5 10*3/uL (ref 4.0–10.5)

## 2020-10-24 LAB — LIPID PANEL
Cholesterol: 142 mg/dL (ref 0–200)
HDL: 36.9 mg/dL — ABNORMAL LOW (ref >39.00)
LDL Cholesterol: 87 mg/dL (ref 0–99)
NonHDL: 105.37
Total CHOL/HDL Ratio: 4
Triglycerides: 92 mg/dL (ref 0.0–149.0)
VLDL: 18.4 mg/dL (ref 0.0–40.0)

## 2020-10-24 LAB — COMPREHENSIVE METABOLIC PANEL
ALT: 12 U/L (ref 0–53)
AST: 13 U/L (ref 0–37)
Albumin: 3.9 g/dL (ref 3.5–5.2)
Alkaline Phosphatase: 46 U/L (ref 39–117)
BUN: 23 mg/dL (ref 6–23)
CO2: 26 mEq/L (ref 19–32)
Calcium: 8.7 mg/dL (ref 8.4–10.5)
Chloride: 107 mEq/L (ref 96–112)
Creatinine, Ser: 1.25 mg/dL (ref 0.40–1.50)
GFR: 57.47 mL/min — ABNORMAL LOW (ref >60.00)
Glucose, Bld: 88 mg/dL (ref 70–99)
Potassium: 4.2 mEq/L (ref 3.5–5.1)
Sodium: 140 mEq/L (ref 135–145)
Total Bilirubin: 0.5 mg/dL (ref 0.2–1.2)
Total Protein: 6.1 g/dL (ref 6.0–8.3)

## 2020-10-24 LAB — TSH: TSH: 3.48 u[IU]/mL (ref 0.35–5.50)

## 2020-10-24 LAB — PSA: PSA: 1.1 ng/mL (ref 0.10–4.00)

## 2020-10-24 NOTE — Patient Instructions (Signed)
It was great seeing you today   We will follow up with you regarding your blood work    

## 2020-10-24 NOTE — Progress Notes (Signed)
Subjective:    Patient ID: Harold Wilson., male    DOB: [birth year], 75 y.o.   MRN: [id number]  HPI Patient presents for yearly preventative medicine examination. He is a pleasant 75 year old male who  has a past medical history of BENIGN PROSTATIC HYPERTROPHY (02/06/2007), COLONIC POLYPS, HX OF (02/06/2007), COVID (09/18/2020), DIVERTICULOSIS, COLON (02/06/2007), ERECTILE DYSFUNCTION, NON-ORGANIC, MILD (02/23/2009), HYPERLIPIDEMIA (02/22/2008), Hypertension, and OSTEOARTHRITIS (02/06/2007).  Hypertension-currently prescribed Norvasc 10 mg daily and lisinopril 40 mg daily.  He does check his blood pressure at home periodically and reports readings between 120 and 140 over 70s to 80s.  He denies dizziness, lightheadedness, chest pain, or shortness of breath BP Readings from Last 3 Encounters:  10/24/20 128/72  09/20/20 118/81  05/12/20 116/66   Anxiety - prescribed Prozac 20 mg daily.  He does feel well controlled on this medication  Osteoarthritis of multiple joints -regimen includes naproxen 500 mg twice daily, Tramadol 50-100 mg BID, and Flexeril 5 mg 3 times daily as needed.  BPH-takes Flomax 0.4 mg daily and Cialis 5 mg daily.  He does feel well controlled on this combination.  Insomnia-uses Ambien 10 mg nightly.  He does feel as though he gets a restful night sleep.  Does not feel groggy when waking up in the morning.  All immunizations and health maintenance protocols were reviewed with the patient and needed orders were placed.  Appropriate screening laboratory values were ordered for the patient including screening of hyperlipidemia, renal function and hepatic function. If indicated by BPH, a PSA was ordered.  Medication reconciliation,  past medical history, social history, problem list and allergies were reviewed in detail with the patient  Goals were established with regard to weight loss, exercise, and  diet in compliance with medications   Review of Systems   Constitutional: Negative.   HENT: Negative.    Eyes: Negative.   Respiratory: Negative.    Cardiovascular: Negative.   Gastrointestinal: Negative.   Endocrine: Negative.   Genitourinary: Negative.   Musculoskeletal:  Positive for arthralgias.  Skin: Negative.   Allergic/Immunologic: Negative.   Neurological: Negative.   Hematological: Negative.   Psychiatric/Behavioral: Negative.    All other systems reviewed and are negative.  Past Medical History:  Diagnosis Date   BENIGN PROSTATIC HYPERTROPHY 02/06/2007   COLONIC POLYPS, HX OF 02/06/2007   COVID 09/18/2020   per pt   DIVERTICULOSIS, COLON 02/06/2007   ERECTILE DYSFUNCTION, NON-ORGANIC, MILD 02/23/2009   HYPERLIPIDEMIA 02/22/2008   Hypertension    OSTEOARTHRITIS 02/06/2007    Social History   Socioeconomic History   Marital status: Married    Spouse name: Not on file   Number of children: 5   Years of education: Not on file   Highest education level: Not on file  Occupational History   Occupation: retired  Tobacco Use   Smoking status: Former   Smokeless tobacco: Never  Substance and Sexual Activity   Alcohol use: Yes    Alcohol/week: 1.0 standard drink    Types: 1 Cans of beer per week   Drug use: No   Sexual activity: Not on file  Other Topics Concern   Not on file  Social History Narrative   Retired    Married    Social Determinants of Corporate investment banker Strain: Low Risk    Difficulty of Paying Living Expenses: Not hard at all  Food Insecurity: No Food Insecurity   Worried About Programme researcher, broadcasting/film/video in the Last Year: Never  true   Ran Out of Food in the Last Year: Never true  Transportation Needs: No Transportation Needs   Lack of Transportation (Medical): No   Lack of Transportation (Non-Medical): No  Physical Activity: Sufficiently Active   Days of Exercise per Week: 3 days   Minutes of Exercise per Session: 60 min  Stress: No Stress Concern Present   Feeling of Stress : Not at all   Social Connections: Moderately Integrated   Frequency of Communication with Friends and Family: More than three times a week   Frequency of Social Gatherings with Friends and Family: More than three times a week   Attends Religious Services: Never   Database administrator or Organizations: Yes   Attends Engineer, structural: 1 to 4 times per year   Marital Status: Married  Catering manager Violence: Not At Risk   Fear of Current or Ex-Partner: No   Emotionally Abused: No   Physically Abused: No   Sexually Abused: No    Past Surgical History:  Procedure Laterality Date   CERVICAL SPINE SURGERY     ROTATOR CUFF REPAIR     TEMPOROMANDIBULAR JOINT SURGERY     TONSILLECTOMY     VASECTOMY      Family History  Problem Relation Age of Onset   COPD Mother    Heart disease Mother    Heart failure Mother    Cancer Father        lung ca    Allergies  Allergen Reactions   Nickel Rash   Sulfacetamide Sodium     Dryness of mouth     Current Outpatient Medications on File Prior to Visit  Medication Sig Dispense Refill   albuterol (VENTOLIN HFA) 108 (90 Base) MCG/ACT inhaler Inhale 2 puffs into the lungs every 6 (six) hours as needed for wheezing or shortness of breath. 8 g 0   amLODipine (NORVASC) 10 MG tablet TAKE ONE TABLET BY MOUTH DAILY 90 tablet 2   cyclobenzaprine (FLEXERIL) 5 MG tablet TAKE ONE TABLET BY MOUTH THREE TIMES A DAY AS NEEDED FOR MUSCLE SPASMS 90 tablet 1   FLUoxetine (PROZAC) 20 MG capsule TAKE 1 BY MOUTH DAILY 90 capsule 1   fluticasone (FLONASE) 50 MCG/ACT nasal spray Place 2 sprays into both nostrils daily. 48 g 3   lisinopril (ZESTRIL) 40 MG tablet Take 1 tablet (40 mg total) by mouth daily. 90 tablet 3   naproxen (NAPROXEN DR) 500 MG EC tablet Take 1 tablet (500 mg total) by mouth 2 (two) times daily with a meal. 180 tablet 0   sildenafil (REVATIO) 20 MG tablet TAKE TWO TABLETS BY MOUTH DAILY AS DIRECTED 150 tablet 0   tadalafil (CIALIS) 5 MG  tablet TAKE ONE TABLET BY MOUTH DAILY 30 tablet 5   tamsulosin (FLOMAX) 0.4 MG CAPS capsule Take 1 capsule (0.4 mg total) by mouth daily. 90 capsule 1   traMADol (ULTRAM) 50 MG tablet Take 1-2 tablets (50-100 mg total) by mouth 2 (two) times daily as needed. 60 tablet 2   zolpidem (AMBIEN) 10 MG tablet Take 1 tablet (10 mg total) by mouth at bedtime as needed. 30 tablet 2   ondansetron (ZOFRAN) 4 MG tablet Take 1 tablet (4 mg total) by mouth every 8 (eight) hours as needed for nausea or vomiting. (Patient not taking: Reported on 10/24/2020) 20 tablet 0   No current facility-administered medications on file prior to visit.    BP 128/72   Pulse 96   Temp  98.5 F (36.9 C) (Oral)   Ht 5' 8.75" (1.746 m)   Wt 187 lb (84.8 kg)   SpO2 97%   BMI 27.82 kg/m        Objective:   Physical Exam Vitals and nursing note reviewed.  Constitutional:      General: He is not in acute distress.    Appearance: Normal appearance. He is well-developed and normal weight.  HENT:     Head: Normocephalic and atraumatic.     Right Ear: Tympanic membrane, ear canal and external ear normal. There is no impacted cerumen.     Left Ear: Tympanic membrane, ear canal and external ear normal. There is no impacted cerumen.     Nose: Nose normal. No congestion or rhinorrhea.     Mouth/Throat:     Mouth: Mucous membranes are moist.     Pharynx: Oropharynx is clear. No oropharyngeal exudate or posterior oropharyngeal erythema.  Eyes:     General:        Right eye: No discharge.        Left eye: No discharge.     Extraocular Movements: Extraocular movements intact.     Conjunctiva/sclera: Conjunctivae normal.     Pupils: Pupils are equal, round, and reactive to light.  Neck:     Vascular: No carotid bruit.     Trachea: No tracheal deviation.  Cardiovascular:     Rate and Rhythm: Normal rate and regular rhythm.     Pulses: Normal pulses.     Heart sounds: Normal heart sounds. No murmur heard.   No friction  rub. No gallop.  Pulmonary:     Effort: Pulmonary effort is normal. No respiratory distress.     Breath sounds: Normal breath sounds. No stridor. No wheezing, rhonchi or rales.  Chest:     Chest wall: No tenderness.  Abdominal:     General: Bowel sounds are normal. There is no distension.     Palpations: Abdomen is soft. There is no mass.     Tenderness: There is no abdominal tenderness. There is no right CVA tenderness, left CVA tenderness, guarding or rebound.     Hernia: No hernia is present.  Musculoskeletal:        General: No swelling, tenderness, deformity or signs of injury. Normal range of motion.     Right lower leg: No edema.     Left lower leg: No edema.  Lymphadenopathy:     Cervical: No cervical adenopathy.  Skin:    General: Skin is warm and dry.     Capillary Refill: Capillary refill takes less than 2 seconds.     Coloration: Skin is not jaundiced or pale.     Findings: No bruising, erythema, lesion or rash.  Neurological:     General: No focal deficit present.     Mental Status: He is alert and oriented to person, place, and time.     Cranial Nerves: No cranial nerve deficit.     Sensory: No sensory deficit.     Motor: No weakness.     Coordination: Coordination normal.     Gait: Gait normal.     Deep Tendon Reflexes: Reflexes normal.  Psychiatric:        Mood and Affect: Mood normal.        Behavior: Behavior normal.        Thought Content: Thought content normal.        Judgment: Judgment normal.      Assessment & Plan:  1. Routine general medical examination at a health care facility  - CBC with Differential/Platelets; Future - Lipid panel; Future - TSH; Future - CMP; Future - CBC with Differential/Platelets; Future  2. Anxiety - Continue with Prozac   3. Benign prostatic hyperplasia with nocturia  - PSA; Future  4. Primary insomnia - Continue with Ambien   5. Essential hypertension - Well controlled.  - No change in medications  - CBC  with Differential/Platelets; Future - Lipid panel; Future - TSH; Future - CMP; Future - CBC with Differential/Platelets; Future  6. Primary osteoarthritis involving multiple joints - Continue with current medications  - Follow up with Orthopedics as directed  [name], NP

## 2020-10-24 NOTE — Addendum Note (Signed)
Addended by: Kandra Nicolas on: 10/24/2020 10:17 AM   Modules accepted: Orders

## 2020-10-27 ENCOUNTER — Encounter: Payer: Self-pay | Admitting: Adult Health

## 2020-10-29 ENCOUNTER — Other Ambulatory Visit: Payer: Self-pay | Admitting: Adult Health

## 2020-10-29 DIAGNOSIS — M159 Polyosteoarthritis, unspecified: Secondary | ICD-10-CM

## 2020-10-29 DIAGNOSIS — M8949 Other hypertrophic osteoarthropathy, multiple sites: Secondary | ICD-10-CM

## 2020-10-30 NOTE — Telephone Encounter (Signed)
Spoke to pt and pt stated the only question was why didn't he get a UA with his blood work. I asked pt was he having any sx pt denied and stated he thought he always had a UA with his labs. I advised pt that the last UA was in 2019. Pt verbalized understanding. No further actions needed.

## 2020-11-11 ENCOUNTER — Encounter: Payer: Self-pay | Admitting: Adult Health

## 2020-11-14 ENCOUNTER — Encounter: Payer: Self-pay | Admitting: Adult Health

## 2020-11-18 ENCOUNTER — Other Ambulatory Visit: Payer: Self-pay | Admitting: Adult Health

## 2020-11-20 ENCOUNTER — Other Ambulatory Visit: Payer: Self-pay

## 2020-11-20 DIAGNOSIS — I10 Essential (primary) hypertension: Secondary | ICD-10-CM

## 2020-11-20 MED ORDER — LISINOPRIL 40 MG PO TABS: 40.0000 mg | ORAL_TABLET | Freq: Every day | ORAL | 3 refills | Status: DC

## 2020-11-21 ENCOUNTER — Other Ambulatory Visit: Payer: Self-pay | Admitting: Adult Health

## 2020-11-21 DIAGNOSIS — F5101 Primary insomnia: Secondary | ICD-10-CM

## 2020-11-21 MED ORDER — ZOLPIDEM TARTRATE 10 MG PO TABS: 10.0000 mg | ORAL_TABLET | Freq: Every evening | ORAL | 2 refills | Status: DC | PRN

## 2020-11-28 ENCOUNTER — Ambulatory Visit (INDEPENDENT_AMBULATORY_CARE_PROVIDER_SITE_OTHER): Payer: PPO

## 2020-11-28 ENCOUNTER — Other Ambulatory Visit: Payer: Self-pay

## 2020-11-28 DIAGNOSIS — Z23 Encounter for immunization: Secondary | ICD-10-CM | POA: Diagnosis not present

## 2020-12-03 ENCOUNTER — Encounter: Payer: Self-pay | Admitting: Adult Health

## 2020-12-03 ENCOUNTER — Other Ambulatory Visit: Payer: Self-pay | Admitting: Adult Health

## 2020-12-03 DIAGNOSIS — N401 Enlarged prostate with lower urinary tract symptoms: Secondary | ICD-10-CM

## 2020-12-03 DIAGNOSIS — R351 Nocturia: Secondary | ICD-10-CM

## 2020-12-15 ENCOUNTER — Telehealth: Payer: Self-pay | Admitting: Pharmacist

## 2020-12-15 NOTE — Chronic Care Management (AMB) (Signed)
Chronic Care Management Pharmacy Assistant   Name: Bryne Lindon.  MRN: [id number] DOB: 1945-08-30  Reason for Encounter: Disease State/ General Assessment Call.    Conditions to be addressed/monitored: HTN and HLD   Recent office visits:  10/24/20 [name] NP (PCP) - seen for routine general examination and other chronic conditions. Discontinued benzonatate and doxycycline. Follow up regarding blood work.   10/03/20 [name] NP (PCP) - seen for COVID-19. Patient started on doxycycline 100mg  2 times daily. Increased benzonatate to 3 times daily. Discontinued Covid-19 vaccine and tetanus vaccine. Follow up as needed.   09/21/20 09/23/20 DO [facility]) - video visit for COVID-19. Patient started on benzonatate 100mg , Molnupiravir 800mg  2 times daily. Discontinued prednisone 10mg . Follow up as needed.   Recent consult visits:  None.  Hospital visits:  Medication Reconciliation was completed by comparing discharge summary, patient's EMR and Pharmacy list, and upon discussion with patient.  Patient visited [facility] for 1 hour due to sore throat.   New?Medications Started at [facility] Discharge:?? -started None.  Medication Changes at Hospital Discharge: -Changed None.  Medications Discontinued at Hospital Discharge: -Stopped None.  Medications that remain the same after Hospital Discharge:??  -All other medications will remain the same.    Medications: Outpatient Encounter Medications as of 12/15/2020  Medication Sig   albuterol (VENTOLIN HFA) 108 (90 Base) MCG/ACT inhaler Inhale 2 puffs into the lungs every 6 (six) hours as needed for wheezing or shortness of breath.   amLODipine (NORVASC) 10 MG tablet TAKE ONE TABLET BY MOUTH DAILY   cyclobenzaprine (FLEXERIL) 5 MG tablet TAKE ONE TABLET BY MOUTH THREE TIMES A DAY AS NEEDED FOR MUSCLE SPASMS   FLUoxetine (PROZAC) 20 MG capsule TAKE 1 BY MOUTH DAILY   fluticasone (FLONASE)  50 MCG/ACT nasal spray Place 2 sprays into both nostrils daily.   lisinopril (ZESTRIL) 40 MG tablet Take 1 tablet (40 mg total) by mouth daily.   naproxen (NAPROXEN DR) 500 MG EC tablet Take 1 tablet (500 mg total) by mouth 2 (two) times daily with a meal.   ondansetron (ZOFRAN) 4 MG tablet Take 1 tablet (4 mg total) by mouth every 8 (eight) hours as needed for nausea or vomiting. (Patient not taking: Reported on 10/24/2020)   sildenafil (REVATIO) 20 MG tablet TAKE TWO TABLETS BY MOUTH DAILY AS DIRECTED   tadalafil (CIALIS) 5 MG tablet TAKE ONE TABLET BY MOUTH DAILY   tamsulosin (FLOMAX) 0.4 MG CAPS capsule Take 1 capsule (0.4 mg total) by mouth daily.   traMADol (ULTRAM) 50 MG tablet Take 1-2 tablets (50-100 mg total) by mouth 2 (two) times daily as needed.   zolpidem (AMBIEN) 10 MG tablet Take 1 tablet (10 mg total) by mouth at bedtime as needed.   No facility-administered encounter medications on file as of 12/15/2020.   Fill History: ALBUTEROL SULFATE HFA 108MCG/ACT AEROSOL SOLUTION 09/27/2020 25   BENZONATATE 200MG  CAPSULE 10/03/2020 10   FLUOXETINE HYDROCHLORIDE 20MG  CAPSULE 12/07/2020 90   FLUTICASONE PROPIONATE 50MCG/ACT SUSPENSION 07/25/2020 90   LISINOPRIL 40MG  TABLET 11/11/2020 90   TAMSULOSIN HYDROCHLORIDE 0.4MG  CAPSULE 12/07/2020 90   AMLODIPINE BESYLATE 10MG  TABLET 10/22/2020 90   Reviewed chart prior to disease state call. Spoke with patient regarding BP  Recent Office Vitals: BP Readings from Last 3 Encounters:  10/24/20 128/72  09/20/20 118/81  05/12/20 116/66   Pulse Readings from Last 3 Encounters:  10/24/20 96  09/20/20 92  05/12/20 88    Wt  Readings from Last 3 Encounters:  10/24/20 187 lb (84.8 kg)  09/20/20 190 lb (86.2 kg)  08/02/20 190 lb (86.2 kg)     Kidney Function Lab Results  Component Value Date/Time   CREATININE 1.25 10/24/2020 10:17 AM   CREATININE 1.22 04/06/2020 10:52 AM   CREATININE 1.29 (H) 10/05/2019 09:54 AM   CREATININE 1.06  03/13/2018 03:49 PM   GFR 57.47 (L) 10/24/2020 10:17 AM   GFRNONAA 55 (L) 10/05/2019 09:54 AM   GFRAA 64 10/05/2019 09:54 AM    BMP Latest Ref Rng & Units 10/24/2020 04/06/2020 10/05/2019  Glucose 70 - 99 mg/dL 88 87 81  BUN 6 - 23 mg/dL 23 17 15   Creatinine 0.40 - 1.50 mg/dL 3.61 4.43)  BUN/Creat Ratio 6 - 22 (calc) - - 12  Sodium 135 - 145 mEq/L 140 141 142  Potassium 3.5 - 5.1 mEq/L 4.2 4.1 4.3  Chloride 96 - 112 mEq/L 107 108 109  CO2 19 - 32 mEq/L 26 29 26   Calcium 8.4 - 10.5 mg/dL 8.7 8.5 1.54(M)    Current antihypertensive regimen:  Amlodipine 10mg  - take one tablet by mouth daily. Lisinopril 40mg  - take 1 tablet by mouth daily. How often are you checking your Blood Pressure?  Current home BP readings:  What recent interventions/DTPs have been made by any provider to improve Blood Pressure control since last CPP Visit: None. Any recent hospitalizations or ED visits since last visit with CPP? Yes  Adherence Review: Is the patient currently on ACE/ARB medication? Yes Does the patient have >5 day gap between last estimated fill dates? No  Comprehensive medication review performed; Spoke to patient regarding cholesterol  Lipid Panel    Component Value Date/Time   CHOL 142 10/24/2020 1017   TRIG 92.0 10/24/2020 1017   TRIG 48 01/28/2006 0840   HDL 36.90 (L) 10/24/2020 1017   LDLCALC 87 10/24/2020 1017   LDLCALC 86 10/05/2019 0954    10-year ASCVD risk score: The 10-year ASCVD risk score (Arnett DK, et al., 2019) is: 23.1%   Values used to calculate the score:     Age: 75 years     Sex: Male     Is Non-Hispanic African American: No     Diabetic: No     Tobacco smoker: No     Systolic Blood Pressure: 128 mmHg     Is BP treated: Yes     HDL Cholesterol: 36.9 mg/dL     Total Cholesterol: 142 mg/dL  Current antihyperlipidemic regimen:  No medications currently.  Previous antihyperlipidemic medications tried: None ASCVD risk enhancing conditions: age >31 and  HTN What recent interventions/DTPs have been made by any provider to improve Cholesterol control since last CPP Visit: None. Any recent hospitalizations or ED visits since last visit with CPP? Yes  Adherence Review: Does the patient have >5 day gap between last estimated fill dates? No  Multiple unsuccessful attempts to reach patient by phone.   Care Gaps:  AWV - scheduled for 10/16/21  Star Rating Drugs:  Lisinopril 40mg  - last filled on 11/11/20 [facility] at [facility]  Clinical Pharmacist Assistant [phone]

## 2020-12-19 NOTE — Telephone Encounter (Cosign Needed)
2nd attempt

## 2020-12-22 NOTE — Telephone Encounter (Cosign Needed)
3rd attempt

## 2021-01-12 ENCOUNTER — Telehealth: Payer: Self-pay | Admitting: Pharmacist

## 2021-01-12 NOTE — Chronic Care Management (AMB) (Signed)
Chronic Care Management Pharmacy Assistant   Name: Harold Wilson.  MRN: [id number] DOB: 06-Jan-1946   Reason for Encounter: Disease State / Hypertension Assessment Call   Recent office visits:  None  Recent consult visits:  None  Hospital visits:  None  Medications: Outpatient Encounter Medications as of 01/12/2021  Medication Sig   albuterol (VENTOLIN HFA) 108 (90 Base) MCG/ACT inhaler Inhale 2 puffs into the lungs every 6 (six) hours as needed for wheezing or shortness of breath.   amLODipine (NORVASC) 10 MG tablet TAKE ONE TABLET BY MOUTH DAILY   cyclobenzaprine (FLEXERIL) 5 MG tablet TAKE ONE TABLET BY MOUTH THREE TIMES A DAY AS NEEDED FOR MUSCLE SPASMS   FLUoxetine (PROZAC) 20 MG capsule TAKE 1 BY MOUTH DAILY   fluticasone (FLONASE) 50 MCG/ACT nasal spray Place 2 sprays into both nostrils daily.   lisinopril (ZESTRIL) 40 MG tablet Take 1 tablet (40 mg total) by mouth daily.   naproxen (NAPROXEN DR) 500 MG EC tablet Take 1 tablet (500 mg total) by mouth 2 (two) times daily with a meal.   ondansetron (ZOFRAN) 4 MG tablet Take 1 tablet (4 mg total) by mouth every 8 (eight) hours as needed for nausea or vomiting. (Patient not taking: Reported on 10/24/2020)   sildenafil (REVATIO) 20 MG tablet TAKE TWO TABLETS BY MOUTH DAILY AS DIRECTED   tadalafil (CIALIS) 5 MG tablet TAKE ONE TABLET BY MOUTH DAILY   tamsulosin (FLOMAX) 0.4 MG CAPS capsule Take 1 capsule (0.4 mg total) by mouth daily.   traMADol (ULTRAM) 50 MG tablet Take 1-2 tablets (50-100 mg total) by mouth 2 (two) times daily as needed.   zolpidem (AMBIEN) 10 MG tablet Take 1 tablet (10 mg total) by mouth at bedtime as needed.   No facility-administered encounter medications on file as of 01/12/2021.   Fill History: ALBUTEROL SULFATE HFA 108MCG/ACT AEROSOL SOLUTION 09/27/2020 25    BENZONATATE 200MG  CAPSULE 10/03/2020 10    FLUOXETINE HYDROCHLORIDE 20MG  CAPSULE 12/07/2020 90    FLUTICASONE PROPIONATE 50MCG/ACT  SUSPENSION 07/25/2020 90    LISINOPRIL 40MG  TABLET 11/11/2020 90    TAMSULOSIN HYDROCHLORIDE 0.4MG  CAPSULE 12/07/2020 90    AMLODIPINE BESYLATE 10MG  TABLET 10/22/2020 90   Reviewed chart prior to disease state call. Spoke with patient regarding BP  Recent Office Vitals: BP Readings from Last 3 Encounters:  10/24/20 128/72  09/20/20 118/81  05/12/20 116/66   Pulse Readings from Last 3 Encounters:  10/24/20 96  09/20/20 92  05/12/20 88    Wt Readings from Last 3 Encounters:  10/24/20 187 lb (84.8 kg)  09/20/20 190 lb (86.2 kg)  08/02/20 190 lb (86.2 kg)     Kidney Function Lab Results  Component Value Date/Time   CREATININE 1.25 10/24/2020 10:17 AM   CREATININE 1.22 04/06/2020 10:52 AM   CREATININE 1.29 (H) 10/05/2019 09:54 AM   CREATININE 1.06 03/13/2018 03:49 PM   GFR 57.47 (L) 10/24/2020 10:17 AM   GFRNONAA 55 (L) 10/05/2019 09:54 AM   GFRAA 64 10/05/2019 09:54 AM    BMP Latest Ref Rng & Units 10/24/2020 04/06/2020 10/05/2019  Glucose 70 - 99 mg/dL 88 87 81  BUN 6 - 23 mg/dL 23 17 15   Creatinine 0.40 - 1.50 mg/dL 12/05/2019 10/26/2020 06/04/2020)  BUN/Creat Ratio 6 - 22 (calc) - - 12  Sodium 135 - 145 mEq/L 140 141 142  Potassium 3.5 - 5.1 mEq/L 4.2 4.1 4.3  Chloride 96 - 112 mEq/L 107 108 109  CO2 19 -  32 mEq/L 26 29 26   Calcium 8.4 - 10.5 mg/dL 8.7 8.5 )    Current antihypertensive regimen:  Amlodipine 10 mg daily Lisinopril 40 mg daily  How often are you checking your Blood Pressure?   Current home BP readings:  What recent interventions/DTPs have been made by any provider to improve Blood Pressure control since last CPP Visit:   Any recent hospitalizations or ED visits since last visit with CPP? No  What diet changes have been made to improve Blood Pressure Control?   What exercise is being done to improve your Blood Pressure Control?    Adherence Review: Is the patient currently on ACE/ARB medication? Yes Does the patient have >5 day gap between last  estimated fill dates? No  Unable to reach patient after several attempts.  Care Gaps: AWV - scheduled for 10/16/2021 Last BP - 128/72 on 10/24/2020 Covid vaccine - overdue  Star Rating Drugs: Lisinopril 40mg  - last filled on 11/11/20 [facility] at [facility]  Clinical Pharmacist Assistant [phone]

## 2021-02-01 ENCOUNTER — Other Ambulatory Visit: Payer: Self-pay | Admitting: Adult Health

## 2021-02-08 ENCOUNTER — Encounter: Payer: Self-pay | Admitting: Adult Health

## 2021-02-08 DIAGNOSIS — F5101 Primary insomnia: Secondary | ICD-10-CM

## 2021-02-08 MED ORDER — ZOLPIDEM TARTRATE 10 MG PO TABS: 10.0000 mg | ORAL_TABLET | Freq: Every evening | ORAL | 2 refills | Status: DC | PRN

## 2021-02-28 ENCOUNTER — Encounter: Payer: Self-pay | Admitting: Adult Health

## 2021-02-28 ENCOUNTER — Other Ambulatory Visit: Payer: Self-pay | Admitting: Adult Health

## 2021-03-01 ENCOUNTER — Other Ambulatory Visit: Payer: Self-pay

## 2021-03-01 MED ORDER — FLUTICASONE PROPIONATE 50 MCG/ACT NA SUSP: 2.0000 | Freq: Every day | NASAL | 0 refills | Status: DC

## 2021-03-01 MED ORDER — NAPROXEN 500 MG PO TBEC: 500.0000 mg | DELAYED_RELEASE_TABLET | Freq: Two times a day (BID) | ORAL | 1 refills | Status: DC

## 2021-03-06 ENCOUNTER — Other Ambulatory Visit: Payer: Self-pay | Admitting: Adult Health

## 2021-03-06 DIAGNOSIS — M159 Polyosteoarthritis, unspecified: Secondary | ICD-10-CM

## 2021-03-15 ENCOUNTER — Telehealth: Payer: Self-pay | Admitting: Pharmacist

## 2021-03-15 NOTE — Chronic Care Management (AMB) (Signed)
Chronic Care Management Pharmacy Assistant   Name: Harold Wilson.  MRN: [id number] DOB: 13-Jul-1945  Reason for Encounter: Disease State / Hypertension Assessment Call   Conditions to be addressed/monitored: HTN  Recent office visits:  None  Recent consult visits:  None  Hospital visits:  None  Medications: Outpatient Encounter Medications as of 03/15/2021  Medication Sig   albuterol (VENTOLIN HFA) 108 (90 Base) MCG/ACT inhaler Inhale 2 puffs into the lungs every 6 (six) hours as needed for wheezing or shortness of breath.   amLODipine (NORVASC) 10 MG tablet TAKE ONE TABLET BY MOUTH DAILY   cyclobenzaprine (FLEXERIL) 5 MG tablet TAKE ONE TABLET BY MOUTH THREE TIMES A DAY AS NEEDED FOR MUSCLE SPASMS   FLUoxetine (PROZAC) 20 MG capsule TAKE 1 BY MOUTH DAILY   fluticasone (FLONASE) 50 MCG/ACT nasal spray Place 2 sprays into both nostrils daily.   lisinopril (ZESTRIL) 40 MG tablet Take 1 tablet (40 mg total) by mouth daily.   naproxen (NAPROXEN DR) 500 MG EC tablet Take 1 tablet (500 mg total) by mouth 2 (two) times daily with a meal.   ondansetron (ZOFRAN) 4 MG tablet Take 1 tablet (4 mg total) by mouth every 8 (eight) hours as needed for nausea or vomiting. (Patient not taking: Reported on 10/24/2020)   sildenafil (REVATIO) 20 MG tablet TAKE TWO TABLETS BY MOUTH DAILY AS DIRECTED   tadalafil (CIALIS) 5 MG tablet TAKE ONE TABLET BY MOUTH DAILY   tamsulosin (FLOMAX) 0.4 MG CAPS capsule Take 1 capsule (0.4 mg total) by mouth daily.   traMADol (ULTRAM) 50 MG tablet Take 1-2 tablets (50-100 mg total) by mouth 2 (two) times daily as needed.   zolpidem (AMBIEN) 10 MG tablet Take 1 tablet (10 mg total) by mouth at bedtime as needed.   No facility-administered encounter medications on file as of 03/15/2021.  Fill History: FLUOXETINE HYDROCHLORIDE 20MG  CAPSULE 12/07/2020 90   LISINOPRIL 40MG  TABLET 02/08/2021 90   TAMSULOSIN HYDROCHLORIDE 0.4MG  CAPSULE 12/07/2020 90   AMLODIPINE  BESYLATE 10MG  TABLET 01/19/2021 90   Reviewed chart prior to disease state call. Spoke with patient regarding BP  Recent Office Vitals: BP Readings from Last 3 Encounters:  10/24/20 128/72  09/20/20 118/81  05/12/20 116/66   Pulse Readings from Last 3 Encounters:  10/24/20 96  09/20/20 92  05/12/20 88    Wt Readings from Last 3 Encounters:  10/24/20 187 lb (84.8 kg)  09/20/20 190 lb (86.2 kg)  08/02/20 190 lb (86.2 kg)     Kidney Function Lab Results  Component Value Date/Time   CREATININE 1.25 10/24/2020 10:17 AM   CREATININE 1.22 04/06/2020 10:52 AM   CREATININE 1.29 (H) 10/05/2019 09:54 AM   CREATININE 1.06 03/13/2018 03:49 PM   GFR 57.47 (L) 10/24/2020 10:17 AM   GFRNONAA 55 (L) 10/05/2019 09:54 AM   GFRAA 64 10/05/2019 09:54 AM    BMP Latest Ref Rng & Units 10/24/2020 04/06/2020 10/05/2019  Glucose 70 - 99 mg/dL 88 87 81  BUN 6 - 23 mg/dL 23 17 15   Creatinine 0.40 - 1.50 mg/dL 1.25 1.22 1.29(H)  BUN/Creat Ratio 6 - 22 (calc) - - 12  Sodium 135 - 145 mEq/L 140 141 142  Potassium 3.5 - 5.1 mEq/L 4.2 4.1 4.3  Chloride 96 - 112 mEq/L 107 108 109  CO2 19 - 32 mEq/L 26 29 26   Calcium 8.4 - 10.5 mg/dL 8.7 8.5 8.4(L)    Current antihypertensive regimen:  Amlodipine 10 mg daily  How often are you checking your Blood Pressure?   Current home BP readings:   What recent interventions/DTPs have been made by any provider to improve Blood Pressure control since last CPP Visit:   Any recent hospitalizations or ED visits since last visit with CPP? No recent hospital visits.  What diet changes have been made to improve Blood Pressure Control?    What exercise is being done to improve your Blood Pressure Control?    Adherence Review: Is the patient currently on ACE/ARB medication? Yes Does the patient have >5 day gap between last estimated fill dates? No  Unable to reach patient after several attempts.   Care Gaps: AWV - scheduled for 10/16/2021 Last BP - 128/72 on  10/24/2020 Covid vaccine - overdue  Star Rating Drugs: Lisinopril 40mg  - last filled on 02/08/2021 90DS at [facility] Pharmacist Assistant [phone]

## 2021-03-22 ENCOUNTER — Telehealth: Payer: Self-pay | Admitting: Adult Health

## 2021-03-22 NOTE — Telephone Encounter (Signed)
Please advise 

## 2021-03-22 NOTE — Telephone Encounter (Signed)
Informed patient of message and patient has an Virtual appt scheduled 1/24

## 2021-03-22 NOTE — Telephone Encounter (Signed)
Pt call and  stated he want something call in for his cough.

## 2021-03-27 ENCOUNTER — Telehealth: Payer: Self-pay | Admitting: Adult Health

## 2021-03-27 ENCOUNTER — Encounter: Payer: Self-pay | Admitting: Adult Health

## 2021-03-27 ENCOUNTER — Telehealth (INDEPENDENT_AMBULATORY_CARE_PROVIDER_SITE_OTHER): Payer: PPO | Admitting: Adult Health

## 2021-03-27 DIAGNOSIS — J988 Other specified respiratory disorders: Secondary | ICD-10-CM

## 2021-03-27 MED ORDER — PREDNISONE 10 MG PO TABS
10.0000 mg | ORAL_TABLET | Freq: Every day | ORAL | 0 refills | Status: DC
Start: 2021-03-27 — End: 2021-06-28

## 2021-03-27 MED ORDER — HYDROCODONE BIT-HOMATROP MBR 5-1.5 MG/5ML PO SOLN: 5.0000 mL | Freq: Three times a day (TID) | ORAL | 0 refills | Status: DC | PRN

## 2021-03-27 MED ORDER — DOXYCYCLINE HYCLATE 100 MG PO CAPS: 100.0000 mg | ORAL_CAPSULE | Freq: Two times a day (BID) | ORAL | 0 refills | Status: DC

## 2021-03-27 NOTE — Telephone Encounter (Signed)
Patient called to see if there was anything that he could you use to help clear his nostrils out at night. Patient states that he has the nose spray but when he lays down his nose stops up and he cannot breathe so he has to breathe through his mouth which irritates his throat.       Good callback number is [phone]   Please advise

## 2021-03-27 NOTE — Telephone Encounter (Signed)
Patient informed of the message below.

## 2021-03-27 NOTE — Progress Notes (Signed)
Virtual Visit via Video Note  I connected with Harold Wilson on 03/27/21 at  1:30 PM EST by a video enabled telemedicine application and verified that I am speaking with the correct person using two identifiers.  Location patient: home Location provider:work or home office Persons participating in the virtual visit: patient, provider  I discussed the limitations of evaluation and management by telemedicine and the availability of in person appointments. The patient expressed understanding and agreed to proceed.   HPI: 76 year old male who is being evaluated today for an acute issue.  Symptoms started roughly 2 weeks ago and have stayed pretty constant.  Symptoms include sinus congestion, productive cough with green sputum, wheezing, and a raspy sound in his chest.  Denies shortness of breath, fevers, or chills.  Coughing is interrupting his sleep as well as a sleep of his other family members.  Has been using cough drops and Flonase without resolution.   ROS: See pertinent positives and negatives per HPI.  Past Medical History:  Diagnosis Date   BENIGN PROSTATIC HYPERTROPHY 02/06/2007   COLONIC POLYPS, HX OF 02/06/2007   COVID 09/18/2020   per pt   DIVERTICULOSIS, COLON 02/06/2007   ERECTILE DYSFUNCTION, NON-ORGANIC, MILD 02/23/2009   HYPERLIPIDEMIA 02/22/2008   Hypertension    OSTEOARTHRITIS 02/06/2007    Past Surgical History:  Procedure Laterality Date   CERVICAL SPINE SURGERY     ROTATOR CUFF REPAIR     TEMPOROMANDIBULAR JOINT SURGERY     TONSILLECTOMY     VASECTOMY      Family History  Problem Relation Age of Onset   COPD Mother    Heart disease Mother    Heart failure Mother    Cancer Father        lung ca       Current Outpatient Medications:    albuterol (VENTOLIN HFA) 108 (90 Base) MCG/ACT inhaler, Inhale 2 puffs into the lungs every 6 (six) hours as needed for wheezing or shortness of breath., Disp: 8 g, Rfl: 0   amLODipine (NORVASC) 10 MG tablet, TAKE  ONE TABLET BY MOUTH DAILY, Disp: 90 tablet, Rfl: 2   cyclobenzaprine (FLEXERIL) 5 MG tablet, TAKE ONE TABLET BY MOUTH THREE TIMES A DAY AS NEEDED FOR MUSCLE SPASMS, Disp: 90 tablet, Rfl: 1   FLUoxetine (PROZAC) 20 MG capsule, TAKE 1 BY MOUTH DAILY, Disp: 90 capsule, Rfl: 1   fluticasone (FLONASE) 50 MCG/ACT nasal spray, Place 2 sprays into both nostrils daily., Disp: 48 g, Rfl: 0   lisinopril (ZESTRIL) 40 MG tablet, Take 1 tablet (40 mg total) by mouth daily., Disp: 90 tablet, Rfl: 3   naproxen (NAPROXEN DR) 500 MG EC tablet, Take 1 tablet (500 mg total) by mouth 2 (two) times daily with a meal., Disp: 180 tablet, Rfl: 1   ondansetron (ZOFRAN) 4 MG tablet, Take 1 tablet (4 mg total) by mouth every 8 (eight) hours as needed for nausea or vomiting., Disp: 20 tablet, Rfl: 0   sildenafil (REVATIO) 20 MG tablet, TAKE TWO TABLETS BY MOUTH DAILY AS DIRECTED, Disp: 150 tablet, Rfl: 0   tadalafil (CIALIS) 5 MG tablet, TAKE ONE TABLET BY MOUTH DAILY, Disp: 30 tablet, Rfl: 5   tamsulosin (FLOMAX) 0.4 MG CAPS capsule, Take 1 capsule (0.4 mg total) by mouth daily., Disp: 90 capsule, Rfl: 1   traMADol (ULTRAM) 50 MG tablet, Take 1-2 tablets (50-100 mg total) by mouth 2 (two) times daily as needed., Disp: 60 tablet, Rfl: 2   zolpidem (AMBIEN) 10 MG tablet,  Take 1 tablet (10 mg total) by mouth at bedtime as needed., Disp: 30 tablet, Rfl: 2  EXAM:  VITALS per patient if applicable:  GENERAL: alert, oriented, appears well and in no acute distress  HEENT: atraumatic, conjunttiva clear, no obvious abnormalities on inspection of external nose and ears  NECK: normal movements of the head and neck  LUNGS: on inspection no signs of respiratory distress, breathing rate appears normal, no obvious gross SOB, gasping or wheezing  CV: no obvious cyanosis  MS: moves all visible extremities without noticeable abnormality  PSYCH/NEURO: pleasant and cooperative, no obvious depression or anxiety, speech and thought  processing grossly intact  ASSESSMENT AND PLAN:  Discussed the following assessment and plan: 1. Respiratory infection -Treat for respiratory infection due to symptoms and duration.  Advised not to take his Ambien when using Hycodan as this could cause excessive somnolence.  Follow-up in 2 to 3 days if not improving - doxycycline (VIBRAMYCIN) 100 MG capsule; Take 1 capsule (100 mg total) by mouth 2 (two) times daily.  Dispense: 14 capsule; Refill: 0 - predniSONE (DELTASONE) 10 MG tablet; Take 1 tablet (10 mg total) by mouth daily with breakfast.  Dispense: 7 tablet; Refill: 0 - HYDROcodone bit-homatropine (HYCODAN) 5-1.5 MG/5ML syrup; Take 5 mLs by mouth every 8 (eight) hours as needed for cough.  Dispense: 120 mL; Refill: 0      I discussed the assessment and treatment plan with the patient. The patient was provided an opportunity to ask questions and all were answered. The patient agreed with the plan and demonstrated an understanding of the instructions.   The patient was advised to call back or seek an in-person evaluation if the symptoms worsen or if the condition fails to improve as anticipated.   [name], NP

## 2021-04-08 ENCOUNTER — Encounter: Payer: Self-pay | Admitting: Adult Health

## 2021-04-08 DIAGNOSIS — N401 Enlarged prostate with lower urinary tract symptoms: Secondary | ICD-10-CM

## 2021-04-08 DIAGNOSIS — F419 Anxiety disorder, unspecified: Secondary | ICD-10-CM

## 2021-04-09 MED ORDER — FLUOXETINE HCL 20 MG PO CAPS: ORAL_CAPSULE | ORAL | 1 refills | Status: DC

## 2021-04-09 MED ORDER — TAMSULOSIN HCL 0.4 MG PO CAPS: 0.4000 mg | ORAL_CAPSULE | Freq: Every day | ORAL | 1 refills | Status: DC

## 2021-04-17 ENCOUNTER — Other Ambulatory Visit: Payer: Self-pay | Admitting: Adult Health

## 2021-04-25 ENCOUNTER — Other Ambulatory Visit: Payer: Self-pay | Admitting: Cardiovascular Disease

## 2021-04-25 ENCOUNTER — Encounter: Payer: Self-pay | Admitting: Adult Health

## 2021-04-25 DIAGNOSIS — F5101 Primary insomnia: Secondary | ICD-10-CM

## 2021-04-25 MED ORDER — ZOLPIDEM TARTRATE 10 MG PO TABS: 10.0000 mg | ORAL_TABLET | Freq: Every evening | ORAL | 2 refills | Status: DC | PRN

## 2021-04-26 ENCOUNTER — Other Ambulatory Visit: Payer: Self-pay | Admitting: Adult Health

## 2021-04-26 DIAGNOSIS — F5101 Primary insomnia: Secondary | ICD-10-CM

## 2021-04-26 MED ORDER — ZOLPIDEM TARTRATE 10 MG PO TABS: 10.0000 mg | ORAL_TABLET | Freq: Every evening | ORAL | 2 refills | Status: DC | PRN

## 2021-05-04 DIAGNOSIS — H40022 Open angle with borderline findings, high risk, left eye: Secondary | ICD-10-CM | POA: Diagnosis not present

## 2021-05-04 DIAGNOSIS — H52223 Regular astigmatism, bilateral: Secondary | ICD-10-CM | POA: Diagnosis not present

## 2021-05-04 DIAGNOSIS — H2513 Age-related nuclear cataract, bilateral: Secondary | ICD-10-CM | POA: Diagnosis not present

## 2021-05-04 DIAGNOSIS — H11153 Pinguecula, bilateral: Secondary | ICD-10-CM | POA: Diagnosis not present

## 2021-05-04 DIAGNOSIS — H40052 Ocular hypertension, left eye: Secondary | ICD-10-CM | POA: Diagnosis not present

## 2021-05-04 DIAGNOSIS — H524 Presbyopia: Secondary | ICD-10-CM | POA: Diagnosis not present

## 2021-05-04 DIAGNOSIS — H353131 Nonexudative age-related macular degeneration, bilateral, early dry stage: Secondary | ICD-10-CM | POA: Diagnosis not present

## 2021-05-10 ENCOUNTER — Telehealth: Payer: Self-pay | Admitting: Pharmacist

## 2021-05-10 NOTE — Chronic Care Management (AMB) (Signed)
? ? ?Chronic Care Management ?Pharmacy Assistant  ? ?Name: Harold Wilson.  MRN: [id number] DOB: [birth year] ? ?Reason for Encounter: Disease State / Hypertension Assessment Call ?  ?Conditions to be addressed/monitored: ?HTN ? ? ?Recent office visits:  ?03/27/2021 [name] NP - Patient was seen for Respiratory infection. Started Doxycycline 100 mg twice daily, Hycodan and Prednisone 10 mg daily. No follow up noted.  ? ?Recent consult visits:  ?None ? ?Hospital visits:  ?None ? ?Medications: ?Outpatient Encounter Medications as of 05/10/2021  ?Medication Sig  ? albuterol (VENTOLIN HFA) 108 (90 Base) MCG/ACT inhaler Inhale 2 puffs into the lungs every 6 (six) hours as needed for wheezing or shortness of breath.  ? amLODipine (NORVASC) 10 MG tablet TAKE ONE TABLET BY MOUTH DAILY  ? cyclobenzaprine (FLEXERIL) 5 MG tablet TAKE ONE TABLET BY MOUTH THREE TIMES A DAY AS NEEDED FOR MUSCLE SPASMS  ? doxycycline (VIBRAMYCIN) 100 MG capsule Take 1 capsule (100 mg total) by mouth 2 (two) times daily.  ? FLUoxetine (PROZAC) 20 MG capsule TAKE 1 BY MOUTH DAILY  ? fluticasone (FLONASE) 50 MCG/ACT nasal spray Place 2 sprays into both nostrils daily.  ? HYDROcodone bit-homatropine (HYCODAN) 5-1.5 MG/5ML syrup Take 5 mLs by mouth every 8 (eight) hours as needed for cough.  ? lisinopril (ZESTRIL) 40 MG tablet Take 1 tablet (40 mg total) by mouth daily.  ? naproxen (NAPROXEN DR) 500 MG EC tablet Take 1 tablet (500 mg total) by mouth 2 (two) times daily with a meal.  ? ondansetron (ZOFRAN) 4 MG tablet Take 1 tablet (4 mg total) by mouth every 8 (eight) hours as needed for nausea or vomiting.  ? predniSONE (DELTASONE) 10 MG tablet Take 1 tablet (10 mg total) by mouth daily with breakfast.  ? sildenafil (REVATIO) 20 MG tablet TAKE TWO TABLETS BY MOUTH DAILY  ? tadalafil (CIALIS) 5 MG tablet TAKE ONE TABLET BY MOUTH DAILY  ? tamsulosin (FLOMAX) 0.4 MG CAPS capsule Take 1 capsule (0.4 mg total) by mouth daily.  ? traMADol (ULTRAM) 50  MG tablet Take 1-2 tablets (50-100 mg total) by mouth 2 (two) times daily as needed.  ? zolpidem (AMBIEN) 10 MG tablet Take 1 tablet (10 mg total) by mouth at bedtime as needed.  ? ?No facility-administered encounter medications on file as of 05/10/2021.  ?Fill History: ?CYCLOBENZAPRINE 5 MG TABLET 04/21/2021 30  ? ?FLUOXETINE 20 MG CAPSULE 04/17/2021 90  ? ?FLUTICASONE PROPIONATE 50 MCG/ACTUATION SPRAY, SUSPENSION 03/01/2021 90  ? ?LISINOPRIL 40MG  TABLET 02/08/2021 90  ? ?NAPROXEN 500 MG TABLET, DELAYED RELEASE (ENTERIC COATED) 03/01/2021 90  ? ?TAMSULOSIN 0.4 MG CAPSULE 04/17/2021 90  ? ?AMLODIPINE 10 MG TABLET 04/25/2021 90  ? ?Reviewed chart prior to disease state call. Spoke with patient regarding BP ? ?Recent Office Vitals: ?BP Readings from Last 3 Encounters:  ?10/24/20 128/72  ?09/20/20 118/81  ?05/12/20 116/66  ? ?Pulse Readings from Last 3 Encounters:  ?10/24/20 96  ?09/20/20 92  ?05/12/20 88  ?  ?Wt Readings from Last 3 Encounters:  ?10/24/20 187 lb (84.8 kg)  ?09/20/20 190 lb (86.2 kg)  ?08/02/20 190 lb (86.2 kg)  ?  ? ?Kidney Function ?Lab Results  ?Component Value Date/Time  ? CREATININE 1.25 10/24/2020 10:17 AM  ? CREATININE 1.22 04/06/2020 10:52 AM  ? CREATININE 1.29 (H) 10/05/2019 09:54 AM  ? CREATININE 1.06 03/13/2018 03:49 PM  ? GFR 57.47 (L) 10/24/2020 10:17 AM  ? GFRNONAA 55 (L) 10/05/2019 09:54 AM  ? GFRAA 64 10/05/2019  09:54 AM  ? ? ?BMP Latest Ref Rng & Units 10/24/2020 04/06/2020 10/05/2019  ?Glucose 70 - 99 mg/dL 88 87 81  ?BUN 6 - 23 mg/dL 23 17 15   ?Creatinine 0.40 - 1.50 mg/dL 2.13 0.86)  ?BUN/Creat Ratio 6 - 22 (calc) - - 12  ?Sodium 135 - 145 mEq/L 140 141 142  ?Potassium 3.5 - 5.1 mEq/L 4.2 4.1 4.3  ?Chloride 96 - 112 mEq/L 107 108 109  ?CO2 19 - 32 mEq/L 26 29 26   ?Calcium 8.4 - 10.5 mg/dL 8.7 8.5 5.78(I)  ? ? ?Current antihypertensive regimen:  ?Lisnopril 40 mg daily ?Amlodipine 10 mg daily ? ?How often are you checking your Blood Pressure? Patient states he is not checking blood  pressures at home.  He was encouraged to start checking blood pressures at least weekly preferably more often and logging them for review at his upcoming appointment.  ? ?Current home BP readings: Patient is not checking ? ?What recent interventions/DTPs have been made by any provider to improve Blood Pressure control since last CPP Visit: No recent interventions ? ?Any recent hospitalizations or ED visits since last visit with CPP? No recent hospital visits.  ? ?What diet changes have been made to improve Blood Pressure Control?  ?Patient follows a  ?Breakfast - patient will have chicken and biscuits ?Lunch - patient will have sandwich of some kind ?Dinner - patient will have a meal with a meat and vegetable ? ?What exercise is being done to improve your Blood Pressure Control?  ?Patient is out walking every day and he keeps with his wife a 71 month old baby.  ? ?Adherence Review: ?Is the patient currently on ACE/ARB medication? Yes ?Does the patient have >5 day gap between last estimated fill dates? No ? ?Care Gaps: ?AWV - scheduled for 10/16/2021 ?Last BP - 128/72 on 10/24/2020 ?Covid vaccine - overdue ?  ?Star Rating Drugs: ?Lisinopril 40mg  - last filled on 02/08/2021 90DS at 10/26/2020 ?  ? CMA  ?Clinical Pharmacist Assistant ?[phone] ? ?

## 2021-05-11 ENCOUNTER — Encounter: Payer: Self-pay | Admitting: Orthopaedic Surgery

## 2021-05-14 NOTE — Telephone Encounter (Signed)
Actually let's order MRI of the Csp first and then we can touch base with him about the results.  So he does not need an appt with [place] yet.  Thanks.

## 2021-05-14 NOTE — Telephone Encounter (Signed)
Let's have him come back to see [place]

## 2021-05-14 NOTE — Telephone Encounter (Signed)
Looks like xu has not seen him for neck in almost one year.  He has had previous surgery by dr. Ellene Route and according to xu note last April, he was to f/u with him if not better.  If his arm and face on left are both numb, I would be worried for possible stroke if he also has weekness (this message was from 3 days ago, so hopefully that is not the case).

## 2021-05-14 NOTE — Telephone Encounter (Signed)
See message.

## 2021-05-15 ENCOUNTER — Other Ambulatory Visit: Payer: Self-pay

## 2021-05-15 DIAGNOSIS — M542 Cervicalgia: Secondary | ICD-10-CM

## 2021-05-15 NOTE — Telephone Encounter (Signed)
MRI order made.   

## 2021-05-30 DIAGNOSIS — H1045 Other chronic allergic conjunctivitis: Secondary | ICD-10-CM | POA: Diagnosis not present

## 2021-05-30 DIAGNOSIS — H40052 Ocular hypertension, left eye: Secondary | ICD-10-CM | POA: Diagnosis not present

## 2021-05-31 ENCOUNTER — Ambulatory Visit
Admission: RE | Admit: 2021-05-31 | Discharge: 2021-05-31 | Disposition: A | Discharge status: Home / Self Care | Payer: PPO | Source: Ambulatory Visit | Attending: Orthopaedic Surgery | Admitting: Orthopaedic Surgery

## 2021-05-31 DIAGNOSIS — M4802 Spinal stenosis, cervical region: Secondary | ICD-10-CM | POA: Diagnosis not present

## 2021-05-31 DIAGNOSIS — M542 Cervicalgia: Secondary | ICD-10-CM | POA: Diagnosis not present

## 2021-05-31 DIAGNOSIS — M2578 Osteophyte, vertebrae: Secondary | ICD-10-CM | POA: Diagnosis not present

## 2021-05-31 DIAGNOSIS — M4312 Spondylolisthesis, cervical region: Secondary | ICD-10-CM | POA: Diagnosis not present

## 2021-06-11 ENCOUNTER — Other Ambulatory Visit: Payer: Self-pay | Admitting: Adult Health

## 2021-06-11 ENCOUNTER — Encounter: Payer: Self-pay | Admitting: Adult Health

## 2021-06-11 DIAGNOSIS — N401 Enlarged prostate with lower urinary tract symptoms: Secondary | ICD-10-CM

## 2021-06-14 DIAGNOSIS — H40022 Open angle with borderline findings, high risk, left eye: Secondary | ICD-10-CM | POA: Diagnosis not present

## 2021-06-14 DIAGNOSIS — H40052 Ocular hypertension, left eye: Secondary | ICD-10-CM | POA: Diagnosis not present

## 2021-06-20 ENCOUNTER — Encounter: Payer: Self-pay | Admitting: Orthopaedic Surgery

## 2021-06-20 ENCOUNTER — Ambulatory Visit: Payer: PPO | Admitting: Orthopaedic Surgery

## 2021-06-20 DIAGNOSIS — M542 Cervicalgia: Secondary | ICD-10-CM

## 2021-06-20 MED ORDER — TIZANIDINE HCL 4 MG PO TABS: 2.0000 mg | ORAL_TABLET | Freq: Three times a day (TID) | ORAL | 2 refills | Status: DC | PRN

## 2021-06-20 MED ORDER — GABAPENTIN 100 MG PO CAPS: 100.0000 mg | ORAL_CAPSULE | Freq: Three times a day (TID) | ORAL | 3 refills | Status: DC | PRN

## 2021-06-20 MED ORDER — TRAMADOL HCL 50 MG PO TABS: 50.0000 mg | ORAL_TABLET | Freq: Every day | ORAL | 0 refills | Status: DC | PRN

## 2021-06-20 NOTE — Progress Notes (Signed)
? ?Office Visit Note ?  ?Patient: Harold Wilson.           ?Date of Birth: [birth year]           ?MRN: [id number] ?Visit Date: 06/20/2021 ?             ?Requested by: Dorothyann Peng, NP ?[address] ?[place],  [place] [address] ?PCP: Dorothyann Peng, NP ? ? ?Assessment & Plan: ?Visit Diagnoses:  ?1. Neck pain   ? ? ?Plan: Harold Wilson is here to review recent cervical spine MRI.  Denies any changes in his symptoms.  Zanaflex does help with the symptoms and helps him to sleep at night. ? ?Examination of cervical spine is unchanged. ? ?Findings of the MRI of the cervical spine were noted and explained and reviewed with the patient and his wife in detail.  Based on findings and treatment options he has elected to be referred for an ESI.  I have also sent in couple prescriptions for him.  We will see him back as needed. ? ?Follow-Up Instructions: No follow-ups on file.  ? ?Orders:  ?No orders of the defined types were placed in this encounter. ? ?Meds ordered this encounter  ?Medications  ? tiZANidine (ZANAFLEX) 4 MG tablet  ?  Sig: Take 0.5-1 tablets (2-4 mg total) by mouth every 8 (eight) hours as needed for muscle spasms.  ?  Dispense:  30 tablet  ?  Refill:  2  ? traMADol (ULTRAM) 50 MG tablet  ?  Sig: Take 1-2 tablets (50-100 mg total) by mouth daily as needed.  ?  Dispense:  20 tablet  ?  Refill:  0  ? gabapentin (NEURONTIN) 100 MG capsule  ?  Sig: Take 1-2 capsules (100-200 mg total) by mouth 3 (three) times daily as needed.  ?  Dispense:  30 capsule  ?  Refill:  3  ? ? ? ? Procedures: ?No procedures performed ? ? ?Clinical Data: ?No additional findings. ? ? ?Subjective: ?Chief Complaint  ?Patient presents with  ? Neck - Pain  ? ? ?HPI ? ?Review of Systems ? ? ?Objective: ?Vital Signs: There were no vitals taken for this visit. ? ?Physical Exam ? ?Ortho Exam ? ?Specialty Comments:  ?No specialty comments available. ? ?Imaging: ?No results found. ? ? ?[name] History: ?Patient Active Problem List  ? Diagnosis  Date Noted  ? Neck pain 06/20/2021  ? Essential hypertension 01/21/2019  ? Insomnia 06/02/2012  ? ADHD (attention deficit hyperactivity disorder) 11/11/2011  ? ERECTILE DYSFUNCTION, NON-ORGANIC, MILD 02/23/2009  ? Dyslipidemia 02/22/2008  ? DIVERTICULOSIS, COLON 02/06/2007  ? BPH (benign prostatic hyperplasia) 02/06/2007  ? Osteoarthritis 02/06/2007  ? History of colonic polyps 02/06/2007  ? ?Past Medical History:  ?Diagnosis Date  ? BENIGN PROSTATIC HYPERTROPHY 02/06/2007  ? COLONIC POLYPS, HX OF 02/06/2007  ? COVID 09/18/2020  ? per pt  ? DIVERTICULOSIS, COLON 02/06/2007  ? ERECTILE DYSFUNCTION, NON-ORGANIC, MILD 02/23/2009  ? HYPERLIPIDEMIA 02/22/2008  ? Hypertension   ? OSTEOARTHRITIS 02/06/2007  ?  ?Family History  ?Problem Relation Age of Onset  ? COPD Mother   ? Heart disease Mother   ? Heart failure Mother   ? Cancer Father   ?     lung ca  ?  ?Past Surgical History:  ?Procedure Laterality Date  ? CERVICAL SPINE SURGERY    ? ROTATOR CUFF REPAIR    ? TEMPOROMANDIBULAR JOINT SURGERY    ? TONSILLECTOMY    ? VASECTOMY    ? ?  Social History  ? ?Occupational History  ? Occupation: retired  ?Tobacco Use  ? Smoking status: Former  ? Smokeless tobacco: Never  ?Substance and Sexual Activity  ? Alcohol use: Yes  ?  Alcohol/week: 1.0 standard drink  ?  Types: 1 Cans of beer per week  ? Drug use: No  ? Sexual activity: Not on file  ? ? ? ? ? ? ?

## 2021-06-21 ENCOUNTER — Other Ambulatory Visit: Payer: Self-pay

## 2021-06-21 DIAGNOSIS — M542 Cervicalgia: Secondary | ICD-10-CM

## 2021-06-28 ENCOUNTER — Encounter: Payer: Self-pay | Admitting: Adult Health

## 2021-06-28 ENCOUNTER — Ambulatory Visit (INDEPENDENT_AMBULATORY_CARE_PROVIDER_SITE_OTHER): Payer: PPO | Admitting: Adult Health

## 2021-06-28 VITALS — BP 120/70 | HR 97 | Temp 98.5°F | Ht 68.75 in | Wt 195.0 lb

## 2021-06-28 DIAGNOSIS — F1010 Alcohol abuse, uncomplicated: Secondary | ICD-10-CM | POA: Diagnosis not present

## 2021-06-28 DIAGNOSIS — R410 Disorientation, unspecified: Secondary | ICD-10-CM

## 2021-06-28 DIAGNOSIS — F39 Unspecified mood [affective] disorder: Secondary | ICD-10-CM | POA: Diagnosis not present

## 2021-06-28 LAB — COMPREHENSIVE METABOLIC PANEL
ALT: 26 U/L (ref 0–53)
AST: 27 U/L (ref 0–37)
Albumin: 4.3 g/dL (ref 3.5–5.2)
Alkaline Phosphatase: 49 U/L (ref 39–117)
BUN: 21 mg/dL (ref 6–23)
CO2: 27 mEq/L (ref 19–32)
Calcium: 8.6 mg/dL (ref 8.4–10.5)
Chloride: 107 mEq/L (ref 96–112)
Creatinine, Ser: 1.46 mg/dL (ref 0.40–1.50)
GFR: 47.47 mL/min — ABNORMAL LOW (ref >60.00)
Glucose, Bld: 98 mg/dL (ref 70–99)
Potassium: 4.3 mEq/L (ref 3.5–5.1)
Sodium: 140 mEq/L (ref 135–145)
Total Bilirubin: 0.6 mg/dL (ref 0.2–1.2)
Total Protein: 6.5 g/dL (ref 6.0–8.3)

## 2021-06-28 LAB — CBC WITH DIFFERENTIAL/PLATELET
Basophils Absolute: 0 10*3/uL (ref 0.0–0.1)
Basophils Relative: 0.8 % (ref 0.0–3.0)
Eosinophils Absolute: 0.1 10*3/uL (ref 0.0–0.7)
Eosinophils Relative: 2.4 % (ref 0.0–5.0)
HCT: 39.9 % (ref 39.0–52.0)
Hemoglobin: 13.5 g/dL (ref 13.0–17.0)
Lymphocytes Relative: 18.5 % (ref 12.0–46.0)
Lymphs Abs: 0.6 10*3/uL — ABNORMAL LOW (ref 0.7–4.0)
MCHC: 34 g/dL (ref 30.0–36.0)
MCV: 91.3 fl (ref 78.0–100.0)
Monocytes Absolute: 0.4 10*3/uL (ref 0.1–1.0)
Monocytes Relative: 14.6 % — ABNORMAL HIGH (ref 3.0–12.0)
Neutro Abs: 2 10*3/uL (ref 1.4–7.7)
Neutrophils Relative %: 63.7 % (ref 43.0–77.0)
Platelets: 127 10*3/uL — ABNORMAL LOW (ref 150.0–400.0)
RBC: 4.36 Mil/uL (ref 4.22–5.81)
RDW: 13.8 % (ref 11.5–15.5)
WBC: 3.1 10*3/uL — ABNORMAL LOW (ref 4.0–10.5)

## 2021-06-28 LAB — TSH: TSH: 3.01 u[IU]/mL (ref 0.35–5.50)

## 2021-06-28 LAB — FOLATE: Folate: >23.5 ng/mL (ref >5.9)

## 2021-06-28 LAB — VITAMIN B12: Vitamin B-12: 194 pg/mL — ABNORMAL LOW (ref 211–911)

## 2021-06-28 MED ORDER — FLUOXETINE HCL 40 MG PO CAPS: 40.0000 mg | ORAL_CAPSULE | Freq: Every day | ORAL | 3 refills | Status: DC

## 2021-06-28 NOTE — Progress Notes (Signed)
? ?Subjective:  ? ? Patient ID: Harold Wilson., male    DOB: [birth year], 76 y.o.   MRN: [id number] ? ?HPI ? ?76 year old male who  has a past medical history of BENIGN PROSTATIC HYPERTROPHY (02/06/2007), COLONIC POLYPS, HX OF (02/06/2007), COVID (09/18/2020), DIVERTICULOSIS, COLON (02/06/2007), ERECTILE DYSFUNCTION, NON-ORGANIC, MILD (02/23/2009), HYPERLIPIDEMIA (02/22/2008), Hypertension, and OSTEOARTHRITIS (02/06/2007). ? ?He presents to the office today with his wife for concern of mental status, mood disorder and alcohol consumption  ? ?His wife reports today that over the last month or so the patient has become "unbearable".  Per patient report "during the day he is fine but around 4 to 5 PM he becomes agitated, paranoid, and having a hard time remembering things.  In the evening needs constantly yelling at family members including a 37-year-old baby that is living with [place].  He goes to bed at 8 and wakes up the next day and is completely normal". ? ?She continues, "we got hello fresh and he becomes agitated when he can follow directions.  There was also a time where he fed the baby at 6:30 in the evening and around 9 PM he stated to the baby I know you must be hungry because you have not eaten yet."  ? ?His wife reports that he is drinking more than appropriate endorse for and is also drinking and driving, again the patient endorses this as well. ? ?Once the patient's wife left the room I got his side of the story. ? ?Per patient "during the day I am watching the baby while she (his wife) is sleeping and then she gets mad at me for not being able to get things done around the house.  I also loaned my daughter $82 to buy a house and I have not been paid back yet, muscle pain off my wife's medical bills, and I am in constant pain in my neck and left shoulder."  ? ?He reports that he is drinking more " is up out of the bottle throughout the day and then have a couple drinks at night".  He reports that his  increase in drinking is due to the chronic pain and agitation from his wife. ? ?He is taking Prozac 20 mg daily ? ?He has been seen by orthopedics for his neck and shoulder pain and has been referred over to physical medicine and rehab but is just waiting on the appointment. ?Review of Systems ?See HPI  ? ?Past Medical History:  ?Diagnosis Date  ? BENIGN PROSTATIC HYPERTROPHY 02/06/2007  ? COLONIC POLYPS, HX OF 02/06/2007  ? COVID 09/18/2020  ? per pt  ? DIVERTICULOSIS, COLON 02/06/2007  ? ERECTILE DYSFUNCTION, NON-ORGANIC, MILD 02/23/2009  ? HYPERLIPIDEMIA 02/22/2008  ? Hypertension   ? OSTEOARTHRITIS 02/06/2007  ? ? ?Social History  ? ?Socioeconomic History  ? Marital status: Married  ?  Spouse name: Not on file  ? Number of children: 5  ? Years of education: Not on file  ? Highest education level: Not on file  ?Occupational History  ? Occupation: retired  ?Tobacco Use  ? Smoking status: Former  ? Smokeless tobacco: Never  ?Substance and Sexual Activity  ? Alcohol use: Yes  ?  Alcohol/week: 1.0 standard drink  ?  Types: 1 Cans of beer per week  ? Drug use: No  ? Sexual activity: Not on file  ?Other Topics Concern  ? Not on file  ?Social History Narrative  ? Retired   ? Married   ? ?  Social Determinants of Health  ? ?Financial Resource Strain: Low Risk   ? Difficulty of Paying Living Expenses: Not hard at all  ?Food Insecurity: No Food Insecurity  ? Worried About Programme researcher, broadcasting/film/videounning Out of Food in the Last Year: Never true  ? Ran Out of Food in the Last Year: Never true  ?Transportation Needs: No Transportation Needs  ? Lack of Transportation (Medical): No  ? Lack of Transportation (Non-Medical): No  ?Physical Activity: Sufficiently Active  ? Days of Exercise per Week: 3 days  ? Minutes of Exercise per Session: 60 min  ?Stress: No Stress Concern Present  ? Feeling of Stress : Not at all  ?Social Connections: Moderately Integrated  ? Frequency of Communication with Friends and Family: More than three times a week  ? Frequency of  Social Gatherings with Friends and Family: More than three times a week  ? Attends Religious Services: Never  ? Active Member of Clubs or Organizations: Yes  ? Attends BankerClub or Organization Meetings: 1 to 4 times per year  ? Marital Status: Married  ?Intimate Partner Violence: Not At Risk  ? Fear of Current or Ex-Partner: No  ? Emotionally Abused: No  ? Physically Abused: No  ? Sexually Abused: No  ? ? ?Past Surgical History:  ?Procedure Laterality Date  ? CERVICAL SPINE SURGERY    ? ROTATOR CUFF REPAIR    ? TEMPOROMANDIBULAR JOINT SURGERY    ? TONSILLECTOMY    ? VASECTOMY    ? ? ?Family History  ?Problem Relation Age of Onset  ? COPD Mother   ? Heart disease Mother   ? Heart failure Mother   ? Cancer Father   ?     lung ca  ? ? ?Allergies  ?Allergen Reactions  ? Nickel Rash  ? Sulfacetamide Sodium   ?  Dryness of mouth   ? ? ?Current Outpatient Medications on File Prior to Visit  ?Medication Sig Dispense Refill  ? albuterol (VENTOLIN HFA) 108 (90 Base) MCG/ACT inhaler Inhale 2 puffs into the lungs every 6 (six) hours as needed for wheezing or shortness of breath. 8 g 0  ? amLODipine (NORVASC) 10 MG tablet TAKE ONE TABLET BY MOUTH DAILY 90 tablet 2  ? cyclobenzaprine (FLEXERIL) 5 MG tablet TAKE ONE TABLET BY MOUTH THREE TIMES A DAY AS NEEDED FOR MUSCLE SPASMS 90 tablet 1  ? fluticasone (FLONASE) 50 MCG/ACT nasal spray Place 2 sprays into both nostrils daily. 48 g 0  ? gabapentin (NEURONTIN) 100 MG capsule Take 1-2 capsules (100-200 mg total) by mouth 3 (three) times daily as needed. 30 capsule 3  ? lisinopril (ZESTRIL) 40 MG tablet Take 1 tablet (40 mg total) by mouth daily. 90 tablet 3  ? naproxen (NAPROXEN DR) 500 MG EC tablet Take 1 tablet (500 mg total) by mouth 2 (two) times daily with a meal. 180 tablet 1  ? ondansetron (ZOFRAN) 4 MG tablet Take 1 tablet (4 mg total) by mouth every 8 (eight) hours as needed for nausea or vomiting. 20 tablet 0  ? sildenafil (REVATIO) 20 MG tablet TAKE TWO TABLETS BY MOUTH DAILY  150 tablet 0  ? tadalafil (CIALIS) 5 MG tablet Take 1 tablet (5 mg total) by mouth daily. Please schedule physical for future refills. 30 tablet 5  ? tamsulosin (FLOMAX) 0.4 MG CAPS capsule Take 1 capsule (0.4 mg total) by mouth daily. 90 capsule 1  ? tiZANidine (ZANAFLEX) 4 MG tablet Take 0.5-1 tablets (2-4 mg total) by mouth every 8 (  eight) hours as needed for muscle spasms. 30 tablet 2  ? traMADol (ULTRAM) 50 MG tablet Take 1-2 tablets (50-100 mg total) by mouth 2 (two) times daily as needed. 60 tablet 2  ? traMADol (ULTRAM) 50 MG tablet Take 1-2 tablets (50-100 mg total) by mouth daily as needed. 20 tablet 0  ? zolpidem (AMBIEN) 10 MG tablet Take 1 tablet (10 mg total) by mouth at bedtime as needed. 30 tablet 2  ? ?No current facility-administered medications on file prior to visit.  ? ? ?BP 120/70   Pulse 97   Temp 98.5 ?F (36.9 ?C) (Oral)   Ht 5' 8.75" (1.746 m)   Wt 195 lb (88.5 kg)   SpO2 97%   BMI 29.01 kg/m?  ? ? ?   ?Objective:  ? Physical Exam ?Vitals and nursing note reviewed.  ?Constitutional:   ?   Appearance: Normal appearance.  ?Cardiovascular:  ?   Rate and Rhythm: Normal rate and regular rhythm.  ?   Pulses: Normal pulses.  ?   Heart sounds: Normal heart sounds.  ?Pulmonary:  ?   Effort: Pulmonary effort is normal.  ?   Breath sounds: Normal breath sounds.  ?Abdominal:  ?   General: Abdomen is flat. Bowel sounds are normal.  ?   Palpations: Abdomen is soft.  ?Musculoskeletal:     ?   General: Normal range of motion.  ?Skin: ?   General: Skin is warm and dry.  ?Neurological:  ?   General: No focal deficit present.  ?   Mental Status: He is alert and oriented to person, place, and time.  ?Psychiatric:     ?   Mood and Affect: Mood normal.     ?   Behavior: Behavior normal.     ?   Thought Content: Thought content normal.     ?   Judgment: Judgment normal.  ? ?   ?Assessment & Plan:  ?1. Alcohol abuse ?I do not think he is going through sundowning or having any issues with early onset dementia.   His MMSE today was 30 out of 30.  I think alcohol consumption throughout the day and into the evening is playing a large role in his cognition and mood.  He is also taking medication such as Ambien, tramad

## 2021-06-28 NOTE — Patient Instructions (Addendum)
I think a lot of what is going on is related to alcohol abuse. Please quit drinking  ? ?I am going to check some labs on you today  ? ?I am also going to increase your prozac from 20 mg to 40 mg daily. You can take two of what you have  ? ? ?You scored perfect on your dementia screen - I do not think you have dementia  ? ?You can schedule your physical exam after 10/24/2021 ?

## 2021-06-29 ENCOUNTER — Other Ambulatory Visit: Payer: Self-pay | Admitting: Orthopaedic Surgery

## 2021-07-20 DIAGNOSIS — H40052 Ocular hypertension, left eye: Secondary | ICD-10-CM | POA: Diagnosis not present

## 2021-07-20 DIAGNOSIS — H1045 Other chronic allergic conjunctivitis: Secondary | ICD-10-CM | POA: Diagnosis not present

## 2021-07-20 DIAGNOSIS — H40023 Open angle with borderline findings, high risk, bilateral: Secondary | ICD-10-CM | POA: Diagnosis not present

## 2021-07-23 ENCOUNTER — Telehealth: Payer: Self-pay | Admitting: Physical Medicine and Rehabilitation

## 2021-07-23 NOTE — Telephone Encounter (Signed)
Pt need to reschedule. Please call pt at [phone].

## 2021-07-25 ENCOUNTER — Encounter: Payer: Self-pay | Admitting: Adult Health

## 2021-07-25 ENCOUNTER — Other Ambulatory Visit: Payer: Self-pay | Admitting: Adult Health

## 2021-07-25 DIAGNOSIS — F5101 Primary insomnia: Secondary | ICD-10-CM

## 2021-07-25 MED ORDER — ZOLPIDEM TARTRATE 10 MG PO TABS: 10.0000 mg | ORAL_TABLET | Freq: Every evening | ORAL | 2 refills | Status: DC | PRN

## 2021-07-27 ENCOUNTER — Encounter: Payer: Self-pay | Admitting: Adult Health

## 2021-07-31 NOTE — Telephone Encounter (Signed)
Spoke to pt and he stated that he was able to get the medication already. No further action needed!

## 2021-08-02 ENCOUNTER — Encounter: Payer: PPO | Admitting: Physical Medicine and Rehabilitation

## 2021-08-07 ENCOUNTER — Other Ambulatory Visit: Payer: Self-pay | Admitting: Adult Health

## 2021-08-07 DIAGNOSIS — M159 Polyosteoarthritis, unspecified: Secondary | ICD-10-CM

## 2021-08-07 NOTE — Telephone Encounter (Signed)
Okay for refill?  

## 2021-08-13 ENCOUNTER — Ambulatory Visit: Payer: Self-pay

## 2021-08-13 ENCOUNTER — Ambulatory Visit: Payer: PPO | Admitting: Physical Medicine and Rehabilitation

## 2021-08-13 ENCOUNTER — Encounter: Payer: Self-pay | Admitting: Physical Medicine and Rehabilitation

## 2021-08-13 VITALS — BP 135/83 | HR 74

## 2021-08-13 DIAGNOSIS — M5412 Radiculopathy, cervical region: Secondary | ICD-10-CM | POA: Diagnosis not present

## 2021-08-13 MED ORDER — METHYLPREDNISOLONE ACETATE 80 MG/ML IJ SUSP
80.0000 mg | Freq: Once | INTRAMUSCULAR | Status: AC
  Administered 2021-08-13: 80 mg

## 2021-08-13 NOTE — Progress Notes (Unsigned)
Pt state neck pain that travels down his left shoulder and arm. Pt state any movement makes the pain worse. Pt state he takes pain meds to help ease is pain.  Numeric Pain Rating Scale and Functional Assessment Average Pain 7   In the last MONTH (on 0-10 scale) has pain interfered with the following?  1. General activity like being  able to carry out your everyday physical activities such as walking, climbing stairs, carrying groceries, or moving a chair?  Rating(10)   +Driver-Pt had to call for an driver, -BT, -Dye Allergies.

## 2021-08-13 NOTE — Patient Instructions (Signed)

## 2021-08-17 ENCOUNTER — Encounter: Payer: Self-pay | Admitting: Adult Health

## 2021-08-21 ENCOUNTER — Telehealth: Payer: Self-pay | Admitting: Pharmacist

## 2021-08-21 NOTE — Chronic Care Management (AMB) (Signed)
    Chronic Care Management Pharmacy Assistant   Name: [name].  MRN: [id number] DOB: 1945/12/16  08/22/2021 APPOINTMENT REMINDER   Called Harold Wilson., No answer, left message of appointment on 08/22/2021 at 10:30 via telephone visit with Gaylord Shih, Pharm D. Notified to have all medications, supplements, blood pressure and/or blood sugar logs available during appointment and to return call if need to reschedule.  Care Gaps: AWV - scheduled 10/16/2021 Last BP - 120/70 on 06/28/2021 Covid booster - overdue  Star Rating Drug: Lisinopril 40 mg - last filled 08/06/2021 90 DS at [facility]  Any gaps in medications fill history? No  Inetta Fermo [facility]  Programme researcher, broadcasting/film/video [phone]

## 2021-08-22 ENCOUNTER — Ambulatory Visit (INDEPENDENT_AMBULATORY_CARE_PROVIDER_SITE_OTHER): Payer: PPO | Admitting: Pharmacist

## 2021-08-22 ENCOUNTER — Other Ambulatory Visit: Payer: Self-pay | Admitting: Adult Health

## 2021-08-22 DIAGNOSIS — E785 Hyperlipidemia, unspecified: Secondary | ICD-10-CM

## 2021-08-22 DIAGNOSIS — I10 Essential (primary) hypertension: Secondary | ICD-10-CM

## 2021-08-22 DIAGNOSIS — N401 Enlarged prostate with lower urinary tract symptoms: Secondary | ICD-10-CM

## 2021-08-22 NOTE — Progress Notes (Unsigned)
Chronic Care Management Pharmacy Note  08/22/2021 Name:  Harold Wilson. MRN:  [id number] DOB:  1945/04/23  Summary: BP mostly at goal < 130/80  Recommendations/Changes made from today's visit: -Recommended supplementing with vitamin B1 100 mg daily given alcohol use -Recommend stopping tadalafil given lack of efficacy and duplicate therapy with sildenafil -Recommended bringing BP cuff to next office visit to ensure accuracy -Recommended routine BP monitoring at home  Plan: BP assessment in 2-3 months   Subjective: Harold Wilson. is an 76 y.o. year old male who is a primary patient of Dorothyann Peng, NP.  The CCM team was consulted for assistance with disease management and care coordination needs.    Engaged with patient by telephone for follow up visit in response to provider referral for pharmacy case management and/or care coordination services.   Consent to Services:  The patient was given information about Chronic Care Management services, agreed to services, and gave verbal consent prior to initiation of services.  Please see initial visit note for detailed documentation.   Patient Care Team: Dorothyann Peng, NP as PCP - General (Family Medicine) O'Neal, Cassie Freer, MD as PCP - Cardiology (Cardiology) Earnie Larsson, [facility] as Pharmacist (Pharmacist)  Recent office visits: 06/28/21 Dorothyann Peng, NP: Patient presented for alcohol abuse and mood disorder. Increased fluoxetine to 40 mg daily.  03/27/2021 Dorothyann Peng NP - Patient was seen for Respiratory infection. Started Doxycycline 100 mg twice daily, Hycodan and Prednisone 10 mg daily. No follow up noted.   Recent consult visits: 08/13/21 Magnus Sinning, MD (physiatry): Patient presented for neck and shoulder pain. Administered steroid injection.  06/20/21 Frankey Shown, MD (ortho): Patient presented for neck pain. Prescribed gabapentin, tizanidine and tramadol PRN.  Hospital visits: None in previous 6  months   Objective:  Lab Results  Component Value Date   CREATININE 1.46 06/28/2021   BUN 21 06/28/2021   GFR 47.47 (L) 06/28/2021   GFRNONAA 55 (L) 10/05/2019   GFRAA 64 10/05/2019   NA 140 06/28/2021   K 4.3 06/28/2021   CALCIUM 8.6 06/28/2021   CO2 27 06/28/2021   GLUCOSE 98 06/28/2021    Lab Results  Component Value Date/Time   GFR 47.47 (L) 06/28/2021 11:44 AM   GFR 57.47 (L) 10/24/2020 10:17 AM    Last diabetic Eye exam: No results found for: "HMDIABEYEEXA"  Last diabetic Foot exam: No results found for: "HMDIABFOOTEX"   Lab Results  Component Value Date   CHOL 142 10/24/2020   HDL 36.90 (L) 10/24/2020   LDLCALC 87 10/24/2020   TRIG 92.0 10/24/2020   CHOLHDL 4 10/24/2020       Latest Ref Rng & Units 06/28/2021   11:44 AM 10/24/2020   10:17 AM 10/05/2019    9:54 AM  Hepatic Function  Total Protein 6.0 - 8.3 g/dL 6.5  6.1  6.0   Albumin 3.5 - 5.2 g/dL 4.3  3.9    AST 0 - 37 U/L '27  13  14   ' ALT 0 - 53 U/L '26  12  12   ' Alk Phosphatase 39 - 117 U/L 49  46    Total Bilirubin 0.2 - 1.2 mg/dL 0.6  0.5  0.5     Lab Results  Component Value Date/Time   TSH 3.01 06/28/2021 11:44 AM   TSH 3.48 10/24/2020 10:17 AM       Latest Ref Rng & Units 06/28/2021   11:44 AM 10/24/2020   10:17 AM 10/05/2019  9:54 AM  CBC  WBC 4.0 - 10.5 K/uL 3.1  4.5  4.4   Hemoglobin 13.0 - 17.0 g/dL 13.5  13.0  12.5   Hematocrit 39.0 - 52.0 % 39.9  39.2  37.3   Platelets 150.0 - 400.0 K/uL 127.0  141.0  144     No results found for: "VD25OH"  Clinical ASCVD: {YES/NO:21197} The 10-year ASCVD risk score (Arnett DK, et al., 2019) is: 26.8%   Values used to calculate the score:     Age: 76 years     Sex: Male     Is Non-Hispanic African American: No     Diabetic: No     Tobacco smoker: No     Systolic Blood Pressure: 093 mmHg     Is BP treated: Yes     HDL Cholesterol: 36.9 mg/dL     Total Cholesterol: 142 mg/dL       06/28/2021   12:03 PM 10/10/2020    1:42 PM 10/05/2019     9:29 AM  Depression screen PHQ 2/9  Decreased Interest 0 0 0  Down, Depressed, Hopeless 1 0 0  PHQ - 2 Score 1 0 0  Altered sleeping 1    Tired, decreased energy 0    Change in appetite 0    Feeling bad or failure about yourself  1    Trouble concentrating 0    Moving slowly or fidgety/restless 1    Suicidal thoughts 0    PHQ-9 Score 4       ***Other: (CHADS2VASc if Afib, MMRC or CAT for COPD, ACT, DEXA)  Social History   Tobacco Use  Smoking Status Former  Smokeless Tobacco Never   BP Readings from Last 3 Encounters:  08/13/21 135/83  06/28/21 120/70  10/24/20 128/72   Pulse Readings from Last 3 Encounters:  08/13/21 74  06/28/21 97  10/24/20 96   Wt Readings from Last 3 Encounters:  06/28/21 195 lb (88.5 kg)  10/24/20 187 lb (84.8 kg)  09/20/20 190 lb (86.2 kg)   BMI Readings from Last 3 Encounters:  06/28/21 29.01 kg/m  10/24/20 27.82 kg/m  09/20/20 28.06 kg/m    Assessment/Interventions: Review of patient past medical history, allergies, medications, health status, including review of consultants reports, laboratory and other test data, was performed as part of comprehensive evaluation and provision of chronic care management services.   SDOH:  (Social Determinants of Health) assessments and interventions performed: {yes/no:20286}  SDOH Screenings   Alcohol Screen: Not on file  Depression (PHQ2-9): Low Risk  (06/28/2021)   Depression (PHQ2-9)    PHQ-2 Score: 4  Financial Resource Strain: Low Risk  (10/10/2020)   Overall Financial Resource Strain (CARDIA)    Difficulty of Paying Living Expenses: Not hard at all  Food Insecurity: No Food Insecurity (10/10/2020)   Hunger Vital Sign    Worried About Running Out of Food in the Last Year: Never true    Ran Out of Food in the Last Year: Never true  Housing: Low Risk  (06/26/2021)   Housing    Last Housing Risk Score: 0  Physical Activity: Sufficiently Active (10/10/2020)   Exercise Vital Sign    Days of  Exercise per Week: 3 days    Minutes of Exercise per Session: 60 min  Social Connections: Moderately Integrated (10/10/2020)   Social Connection and Isolation Panel [NHANES]    Frequency of Communication with Friends and Family: More than three times a week    Frequency of Social Gatherings  with Friends and Family: More than three times a week    Attends Religious Services: Never    Active Member of Clubs or Organizations: Yes    Attends Archivist Meetings: 1 to 4 times per year    Marital Status: Married  Stress: No Stress Concern Present (10/10/2020)   [place]    Feeling of Stress : Not at all  Tobacco Use: Medium Risk (08/13/2021)   Patient History    Smoking Tobacco Use: Former    Smokeless Tobacco Use: Never    Passive Exposure: Not on file  Transportation Needs: No Transportation Needs (10/10/2020)   PRAPARE - Hydrologist (Medical): No    Lack of Transportation (Non-Medical): No    CCM Care Plan  Allergies  Allergen Reactions   Nickel Rash   Sulfacetamide Sodium     Dryness of mouth     Medications Reviewed Today     Reviewed by Beckie Salts, RMA (Registered Medical Assistant) on 08/13/21 at 1600  Med List Status: <None>   Medication Order Taking? Sig Documenting Provider Last Dose Status Informant  albuterol (VENTOLIN HFA) 108 (90 Base) MCG/ACT inhaler [id number] Yes Inhale 2 puffs into the lungs every 6 (six) hours as needed for wheezing or shortness of breath. Nafziger, Tommi Rumps, NP Taking Active   amLODipine (NORVASC) 10 MG tablet [id number] Yes TAKE ONE TABLET BY MOUTH DAILY O'Neal, Cassie Freer, MD Taking Active   cyclobenzaprine (FLEXERIL) 5 MG tablet [id number] Yes TAKE ONE TABLET BY MOUTH THREE TIMES A DAY AS NEEDED FOR MUSCLE SPASMS Nafziger, Tommi Rumps, NP Taking Active   FLUoxetine (PROZAC) 40 MG capsule [id number] Yes Take 1 capsule (40 mg total) by mouth daily.  Nafziger, Tommi Rumps, NP Taking Active   fluticasone (FLONASE) 50 MCG/ACT nasal spray [id number] Yes Place 2 sprays into both nostrils daily. Nafziger, Tommi Rumps, NP Taking Active   gabapentin (NEURONTIN) 100 MG capsule [id number] Yes Take 1-2 capsules (100-200 mg total) by mouth 3 (three) times daily as needed. Leandrew Koyanagi, MD Taking Active   lisinopril (ZESTRIL) 40 MG tablet [id number] Yes Take 1 tablet (40 mg total) by mouth daily. Nafziger, Tommi Rumps, NP Taking Active   naproxen (NAPROXEN DR) 500 MG EC tablet [id number] Yes Take 1 tablet (500 mg total) by mouth 2 (two) times daily with a meal. Nafziger, Tommi Rumps, NP Taking Active   ondansetron (ZOFRAN) 4 MG tablet [id number] Yes Take 1 tablet (4 mg total) by mouth every 8 (eight) hours as needed for nausea or vomiting. Nafziger, Tommi Rumps, NP Taking Active   sildenafil (REVATIO) 20 MG tablet [id number] Yes TAKE TWO TABLETS BY MOUTH DAILY Nafziger, Tommi Rumps, NP Taking Active   tadalafil (CIALIS) 5 MG tablet [id number] Yes Take 1 tablet (5 mg total) by mouth daily. Please schedule physical for future refills. Nafziger, Tommi Rumps, NP Taking Active   tamsulosin (FLOMAX) 0.4 MG CAPS capsule [id number] Yes Take 1 capsule (0.4 mg total) by mouth daily. Nafziger, Tommi Rumps, NP Taking Active   tiZANidine (ZANAFLEX) 4 MG tablet [id number] Yes Take 0.5-1 tablets (2-4 mg total) by mouth every 8 (eight) hours as needed for muscle spasms. Leandrew Koyanagi, MD Taking Active   traMADol Veatrice Bourbon) 50 MG tablet [id number] Yes Take 1-2 tablets (50-100 mg total) by mouth 2 (two) times daily as needed. Aundra Dubin, PA-C Taking Active   traMADol (ULTRAM) 50 MG tablet [id number] Yes TAKE 1 TO 2 TABLETS BY MOUTH DAILY AS NEEDED Dwana Melena  L, PA-C Taking Active   zolpidem (AMBIEN) 10 MG tablet [id number] Yes Take 1 tablet (10 mg total) by mouth at bedtime as needed. Dorothyann Peng, NP Taking Active             Patient Active Problem List   Diagnosis Date Noted   Neck pain 06/20/2021   Essential hypertension  01/21/2019   Insomnia 06/02/2012   ADHD (attention deficit hyperactivity disorder) 11/11/2011   ERECTILE DYSFUNCTION, NON-ORGANIC, MILD 02/23/2009   Dyslipidemia 02/22/2008   DIVERTICULOSIS, COLON 02/06/2007   BPH (benign prostatic hyperplasia) 02/06/2007   Osteoarthritis 02/06/2007   History of colonic polyps 02/06/2007    Immunization History  Administered Date(s) Administered   Fluad Quad(high Dose 65+) 11/28/2020   Influenza Whole 12/01/2012   Influenza, High Dose Seasonal PF 12/14/2013, 11/05/2017, 10/25/2018, 11/10/2019   Influenza-Unspecified 11/29/2016   PFIZER(Purple Top)SARS-COV-2 Vaccination 04/13/2019, 05/04/2019, 11/10/2019, 06/02/2020, 12/03/2020   Pneumococcal Conjugate-13 04/22/2013, 11/05/2017   Pneumococcal Polysaccharide-23 05/09/2014, 11/16/2018   Tdap 09/07/2011   Zoster Recombinat (Shingrix) 11/16/2018, 01/26/2019   Patient just had a shot in his neck and he is hoping -  Conditions to be addressed/monitored:  Hypertension, Hyperlipidemia, BPH, and Insomnia, Pain  Conditions addressed this visit: Hypertension, BPH  There are no care plans that you recently modified to display for this patient.    BPH/ED? (Goal: ***) -{US controlled/uncontrolled:25276} -Current treatment  Tamsulosin 0.4 mg 1 capsule daily - not sure if there is any difference  Tadalafil 5 mg 1 tablet daily Sildenafil 20 mg 2 tablets daily --? Taking both? -Medications previously tried: ***  -{CCMPHARMDINTERVENTION:25122} -doesn't take them together - skips the tadalafil on days that he is taking  -sildenafil - not using it often   -drink 1-2 per day   -vitamin B1 100 mg -   Health Maintenance -Vaccine gaps: COVID booster -Current therapy:  Fluticasone nasal spray as needed Vitamin B12 1000 mcg daily -Educated on {ccm supplement counseling:25128} -{CCM Patient satisfied:25129} -{CCMPHARMDINTERVENTION:25122}  Patient Goals/Self-Care Activities Patient will:  -  {pharmacypatientgoals:24919}  Follow Up Plan: {CM FOLLOW UP POEU:23536}   Medication Assistance: {MEDASSISTANCEINFO:25044}  Compliance/Adherence/Medication fill history: Care Gaps: COVID booster Last BP - 120/70 on 06/28/2021  Star-Rating Drugs: Lisinopril 40 mg - last filled 08/06/2021 90 DS at [facility]  Patient's preferred pharmacy is:  [id number], [place]. [place] [address] Phone: [phone] Fax: [place] [phone] [place], [place]. [place] [address] Phone: [phone] Fax: [phone]  Uses pill box? {Yes or If no, why not?:20788} Pt endorses ***% compliance  We discussed: {Pharmacy options:24294} Patient decided to: {US Pharmacy [facility]  Care Plan and Follow Up Patient Decision:  {FOLLOWUP:24991}  Plan: {CM FOLLOW UP IOXB:35329}  Jeni Salles, PharmD, [facility] Clinical Pharmacist {MP practice sites:26434} [phone]

## 2021-08-23 ENCOUNTER — Encounter: Payer: Self-pay | Admitting: Physical Medicine and Rehabilitation

## 2021-08-31 DIAGNOSIS — I10 Essential (primary) hypertension: Secondary | ICD-10-CM | POA: Diagnosis not present

## 2021-08-31 DIAGNOSIS — N401 Enlarged prostate with lower urinary tract symptoms: Secondary | ICD-10-CM | POA: Diagnosis not present

## 2021-08-31 DIAGNOSIS — F32A Depression, unspecified: Secondary | ICD-10-CM | POA: Diagnosis not present

## 2021-08-31 DIAGNOSIS — N529 Male erectile dysfunction, unspecified: Secondary | ICD-10-CM

## 2021-08-31 DIAGNOSIS — E785 Hyperlipidemia, unspecified: Secondary | ICD-10-CM

## 2021-09-05 ENCOUNTER — Telehealth: Payer: Self-pay | Admitting: Pharmacist

## 2021-09-05 NOTE — Telephone Encounter (Signed)
Called patient to make him aware of the plan to stop Cialis per discussion with PCP. Patient was unsure if it was helping and there is a recommended 36 hour washout period with all other PDE5s and patient has PRN use of sildenafil. Plan to follow up in 1 month to assess any change in symptoms. Patient verbalized his understanding.

## 2021-09-14 DIAGNOSIS — H40052 Ocular hypertension, left eye: Secondary | ICD-10-CM | POA: Diagnosis not present

## 2021-09-14 DIAGNOSIS — H40023 Open angle with borderline findings, high risk, bilateral: Secondary | ICD-10-CM | POA: Diagnosis not present

## 2021-09-14 DIAGNOSIS — H11153 Pinguecula, bilateral: Secondary | ICD-10-CM | POA: Diagnosis not present

## 2021-09-14 DIAGNOSIS — H2513 Age-related nuclear cataract, bilateral: Secondary | ICD-10-CM | POA: Diagnosis not present

## 2021-10-10 ENCOUNTER — Telehealth: Payer: Self-pay | Admitting: Pharmacist

## 2021-10-10 NOTE — Chronic Care Management (AMB) (Signed)
    Chronic Care Management Pharmacy Assistant   Name: Harold Wilson.  MRN: [id number] DOB: Dec 29, 1945  Reason for Encounter: Follow up discontinuation of Cialis for frequent urination and incomplete emptying.   Spoke with patient, he states he stopped Cialis for a month.  He states his frequency increased with no change in his incomplete emptying. Patient chose to go back to taking Cialis and chose to continue on this medication. He states he has not had to get up as often in the evening.    Inetta Fermo [facility]  Clinical Pharmacist Assistant [phone]

## 2021-10-12 ENCOUNTER — Other Ambulatory Visit: Payer: Self-pay | Admitting: Orthopaedic Surgery

## 2021-10-15 ENCOUNTER — Telehealth: Payer: Self-pay | Admitting: Adult Health

## 2021-10-15 NOTE — Telephone Encounter (Signed)
Requesting a refill of  gabapentin (NEURONTIN) 100 MG capsule states he only takes it every once in a while    [facility] [id number] - [place], [place] - [address] Phone:  [phone]  Fax:  [phone]

## 2021-10-16 ENCOUNTER — Ambulatory Visit (INDEPENDENT_AMBULATORY_CARE_PROVIDER_SITE_OTHER): Payer: PPO

## 2021-10-16 VITALS — Ht 69.0 in | Wt 195.0 lb

## 2021-10-16 DIAGNOSIS — Z0000 Encounter for general adult medical examination without abnormal findings: Secondary | ICD-10-CM | POA: Diagnosis not present

## 2021-10-16 NOTE — Progress Notes (Signed)
I connected with Harold Wilson today by telephone and verified that I am speaking with the correct person using two identifiers. Location patient: home Location provider: work Persons participating in the virtual visit: Azarius Lambson, Elisha Ponder LPN.   I discussed the limitations, risks, security and privacy concerns of performing an evaluation and management service by telephone and the availability of in person appointments. I also discussed with the patient that there may be a patient responsible charge related to this service. The patient expressed understanding and verbally consented to this telephonic visit.    Interactive audio and video telecommunications were attempted between this provider and patient, however failed, due to patient having technical difficulties OR patient did not have access to video capability.  We continued and completed visit with audio only.     Vital signs may be patient reported or missing.  Subjective:   Harold Groot. is a 76 y.o. male who presents for Medicare Annual/Subsequent preventive examination.  Review of Systems     Cardiac Risk Factors include: advanced age (>41men, >4 women);dyslipidemia;hypertension;male gender     Objective:    Today's Vitals   10/16/21 1432  Weight: 195 lb (88.5 kg)  Height: 5\' 9"  (1.753 m)   Body mass index is 28.8 kg/m.     10/16/2021    2:36 PM 10/10/2020    1:43 PM  Advanced Directives  Does Patient Have a Medical Advance Directive? Yes Yes  Type of 12/10/2020 of [place];Living will Healthcare Power of [place];Living will  Copy of Healthcare Power of Attorney in Chart? No - copy requested No - copy requested    Current Medications (verified) Outpatient Encounter Medications as of 10/16/2021  Medication Sig   amLODipine (NORVASC) 10 MG tablet TAKE ONE TABLET BY MOUTH DAILY   cyclobenzaprine (FLEXERIL) 5 MG tablet TAKE ONE TABLET BY MOUTH THREE TIMES A DAY AS NEEDED FOR  MUSCLE SPASMS   FLUoxetine (PROZAC) 40 MG capsule Take 1 capsule (40 mg total) by mouth daily.   fluticasone (FLONASE) 50 MCG/ACT nasal spray Place 2 sprays into both nostrils daily.   gabapentin (NEURONTIN) 100 MG capsule Take 1-2 capsules (100-200 mg total) by mouth 3 (three) times daily as needed.   lisinopril (ZESTRIL) 40 MG tablet Take 1 tablet (40 mg total) by mouth daily.   naproxen (NAPROXEN DR) 500 MG EC tablet Take 1 tablet (500 mg total) by mouth 2 (two) times daily with a meal.   sildenafil (REVATIO) 20 MG tablet TAKE TWO TABLETS BY MOUTH DAILY   tamsulosin (FLOMAX) 0.4 MG CAPS capsule Take 1 capsule (0.4 mg total) by mouth daily.   tiZANidine (ZANAFLEX) 4 MG tablet Take 0.5-1 tablets (2-4 mg total) by mouth every 8 (eight) hours as needed for muscle spasms.   traMADol (ULTRAM) 50 MG tablet TAKE 1 TO 2 TABLETS BY MOUTH DAILY AS NEEDED   zolpidem (AMBIEN) 10 MG tablet Take 1 tablet (10 mg total) by mouth at bedtime as needed.   No facility-administered encounter medications on file as of 10/16/2021.    Allergies (verified) Nickel and Sulfacetamide sodium   History: Past Medical History:  Diagnosis Date   BENIGN PROSTATIC HYPERTROPHY 02/06/2007   COLONIC POLYPS, HX OF 02/06/2007   COVID 09/18/2020   per pt   DIVERTICULOSIS, COLON 02/06/2007   ERECTILE DYSFUNCTION, NON-ORGANIC, MILD 02/23/2009   HYPERLIPIDEMIA 02/22/2008   Hypertension    OSTEOARTHRITIS 02/06/2007   Past Surgical History:  Procedure Laterality Date   CERVICAL SPINE SURGERY  ROTATOR CUFF REPAIR     TEMPOROMANDIBULAR JOINT SURGERY     TONSILLECTOMY     VASECTOMY     Family History  Problem Relation Age of Onset   COPD Mother    Heart disease Mother    Heart failure Mother    Cancer Father        lung ca   Social History   Socioeconomic History   Marital status: Married    Spouse name: Not on file   Number of children: 5   Years of education: Not on file   Highest education level: Not on  file  Occupational History   Occupation: retired  Tobacco Use   Smoking status: Former   Smokeless tobacco: Never  Building services engineer Use: Never used  Substance and Sexual Activity   Alcohol use: Yes    Alcohol/week: 1.0 standard drink of alcohol    Types: 1 Cans of beer per week   Drug use: No   Sexual activity: Not on file  Other Topics Concern   Not on file  Social History Narrative   Retired    Married    Social Determinants of Health   Financial Resource Strain: Low Risk  (10/16/2021)   Overall Financial Resource Strain (CARDIA)    Difficulty of Paying Living Expenses: Not hard at all  Food Insecurity: No Food Insecurity (10/16/2021)   Hunger Vital Sign    Worried About Running Out of Food in the Last Year: Never true    Ran Out of Food in the Last Year: Never true  Transportation Needs: No Transportation Needs (10/16/2021)   PRAPARE - Administrator, Civil Service (Medical): No    Lack of Transportation (Non-Medical): No  Physical Activity: Inactive (10/16/2021)   Exercise Vital Sign    Days of Exercise per Week: 0 days    Minutes of Exercise per Session: 0 min  Stress: No Stress Concern Present (10/16/2021)   [employer] of Occupational Health - Occupational Stress Questionnaire    Feeling of Stress : Not at all  Social Connections: Moderately Integrated (10/10/2020)   Social Connection and Isolation Panel [NHANES]    Frequency of Communication with Friends and Family: More than three times a week    Frequency of Social Gatherings with Friends and Family: More than three times a week    Attends Religious Services: Never    Database administrator or Organizations: Yes    Attends Engineer, structural: 1 to 4 times per year    Marital Status: Married    Tobacco Counseling Counseling given: Not Answered   Clinical Intake:  Pre-visit preparation completed: Yes  Wilson : No/denies Wilson     Nutritional Status: BMI 25 -29  Overweight Nutritional Risks: None Diabetes: No  How often do you need to have someone help you when you read instructions, pamphlets, or other written materials from your doctor or pharmacy?: 1 - Never What is the last grade level you completed in school?: GED  Diabetic? no  Interpreter Needed?: No  Information entered by :: NAllen LPN   Activities of Daily Living    10/16/2021    2:37 PM  In your present state of health, do you have any difficulty performing the following activities:  Hearing? 1  Comment has hearing aids  Vision? 0  Difficulty concentrating or making decisions? 0  Walking or climbing stairs? 0  Dressing or bathing? 0  Doing errands, shopping? 0  Preparing  Food and eating ? N  Using the Toilet? N  In the past six months, have you accidently leaked urine? N  Do you have problems with loss of bowel control? N  Managing your Medications? N  Managing your Finances? N  Housekeeping or managing your Housekeeping? N    Patient Care Team: Shirline FreesNafziger, Cory, NP as PCP - General (Family Medicine) O'Neal, Ronnald RampWesley Thomas, MD as PCP - Cardiology (Cardiology) Verner CholPryor, Madeline G, [facility] as Pharmacist (Pharmacist)  Indicate any recent Medical Services you may have received from other than [facility] providers in the past year (date may be approximate).     Assessment:   This is a routine wellness examination for Harold Wilson.  Hearing/Vision screen Vision Screening - [facility]:: [facility] Dr. Jimmey RalphParker  Dietary issues and exercise activities discussed: Current Exercise Habits: The patient does not participate in regular exercise at present   Goals Addressed             This Visit's Progress    Patient Stated       10/16/2021, no goals       Depression Screen    10/16/2021    2:36 PM 06/28/2021   12:03 PM 10/10/2020    1:42 PM 10/05/2019    9:29 AM 09/16/2017    3:06 PM 07/10/2016   11:06 AM 09/01/2015    1:34 PM  PHQ 2/9 Scores  PHQ - 2 Score 0 1 0 0 0  0 0  PHQ- 9 Score  4         Fall Risk    10/16/2021    2:36 PM 10/10/2020    1:45 PM 10/05/2019    9:29 AM 09/16/2017    3:06 PM 07/10/2016   11:06 AM  Fall Risk   Falls in the past year? 0 1 0 No No  Number falls in past yr: 0 1     Injury with Fall? 0 1     Comment  right shoulder     Risk for fall due to : Medication side effect Impaired vision     Follow up Falls evaluation completed;Education provided;Falls prevention discussed Falls prevention discussed       FALL RISK PREVENTION PERTAINING TO THE HOME:  Any stairs in or around the home? Yes  If so, are there any without handrails? Yes  Home free of loose throw rugs in walkways, pet beds, electrical cords, etc? Yes  Adequate lighting in your home to reduce risk of falls? Yes   ASSISTIVE DEVICES UTILIZED TO PREVENT FALLS:  Life alert? No  Use of a cane, walker or w/c? No  Grab bars in the bathroom? No  Shower chair or bench in shower? Yes  Elevated toilet seat or a handicapped toilet? Yes   TIMED UP AND GO:  Was the test performed? No .      Cognitive Function:    06/28/2021   12:05 PM  MMSE - Mini Mental State Exam  Orientation to time 5  Orientation to Place 5  Registration 3  Attention/ Calculation 5  Recall 3  Language- name 2 objects 2  Language- repeat 1  Language- follow 3 step command 3  Language- read & follow direction 1  Write a sentence 1  Copy design 1  Total score 30        10/16/2021    2:38 PM 10/10/2020    1:47 PM  6CIT Screen  What Year? 0 points 0 points  What month?  0 points 0 points  What time? 0 points 0 points  Count back from 20 4 points 0 points  Months in reverse 0 points 0 points  Repeat phrase 0 points 0 points  Total Score 4 points 0 points    Immunizations Immunization History  Administered Date(s) Administered   Fluad Quad(high Dose 65+) 11/28/2020   Influenza Whole 12/01/2012   Influenza, High Dose Seasonal PF 12/14/2013, 11/05/2017, 10/25/2018, 11/10/2019    Influenza-Unspecified 11/29/2016   PFIZER(Purple Top)SARS-COV-2 Vaccination 04/13/2019, 05/04/2019, 11/10/2019, 06/02/2020, 12/03/2020   Pneumococcal Conjugate-13 04/22/2013, 11/05/2017   Pneumococcal Polysaccharide-23 05/09/2014, 11/16/2018   Tdap 09/07/2011   Zoster Recombinat (Shingrix) 11/16/2018, 01/26/2019    TDAP status: Due, Education has been provided regarding the importance of this vaccine. Advised may receive this vaccine at local pharmacy or Health Dept. Aware to provide a copy of the vaccination record if obtained from local pharmacy or Health Dept. Verbalized acceptance and understanding.  Flu Vaccine status: Due, Education has been provided regarding the importance of this vaccine. Advised may receive this vaccine at local pharmacy or Health Dept. Aware to provide a copy of the vaccination record if obtained from local pharmacy or Health Dept. Verbalized acceptance and understanding.  Pneumococcal vaccine status: Up to date  Covid-19 vaccine status: Completed vaccines  Qualifies for Shingles Vaccine? Yes   Zostavax completed Yes   Shingrix Completed?: Yes  Screening Tests Health Maintenance  Topic Date Due   COVID-19 Vaccine (6 - Pfizer series) 01/28/2021   TETANUS/TDAP  09/06/2021   INFLUENZA VACCINE  10/02/2021   COLONOSCOPY (Pts 45-62yrs Insurance coverage will need to be confirmed)  08/12/2023   Pneumonia Vaccine 23+ Years old  Completed   Hepatitis C Screening  Completed   Zoster Vaccines- Shingrix  Completed   HPV VACCINES  Aged Out    Health Maintenance  Health Maintenance Due  Topic Date Due   COVID-19 Vaccine (6 - Pfizer series) 01/28/2021   TETANUS/TDAP  09/06/2021   INFLUENZA VACCINE  10/02/2021    Colorectal cancer screening: Type of screening: Colonoscopy. Completed 08/11/2013. Repeat every 10 years  Lung Cancer Screening: (Low Dose CT Chest recommended if Age 56-80 years, 30 pack-year currently smoking OR have quit w/in 15years.) does not  qualify.   Lung Cancer Screening Referral: no  Additional Screening:  Hepatitis C Screening: does qualify; Completed 10/14/2018  Vision Screening: Recommended annual ophthalmology exams for early detection of glaucoma and other disorders of the eye. Is the patient up to date with their annual eye exam?  Yes  Who is the provider or what is the name of the office in which the patient attends annual eye exams? Harold Wilson If pt is not established with a provider, would they like to be referred to a provider to establish care? No .   Dental Screening: Recommended annual dental exams for proper oral hygiene  Community Resource Referral / Chronic Care Management: CRR required this visit?  No   CCM required this visit?  No      Plan:     I have personally reviewed and noted the following in the patient's chart:   Medical and social history Use of alcohol, tobacco or illicit drugs  Current medications and supplements including opioid prescriptions. Patient is not currently taking opioid prescriptions. Functional ability and status Nutritional status Physical activity Advanced directives List of other physicians Hospitalizations, surgeries, and ER visits in previous 12 months Vitals Screenings to include cognitive, depression, and falls Referrals and appointments  In addition,  I have reviewed and discussed with patient certain preventive protocols, quality metrics, and best practice recommendations. A written personalized care plan for preventive services as well as general preventive health recommendations were provided to patient.     Kellie Simmering, LPN   [id number]   Nurse Notes: none  Due to this being a virtual visit, the after visit summary with patients personalized plan was offered to patient via mail or my-chart.  Patient would like to access on my-chart

## 2021-10-16 NOTE — Patient Instructions (Signed)
Mr. Harold Wilson , Thank you for taking time to come for your Medicare Wellness Visit. I appreciate your ongoing commitment to your health goals. Please review the following plan we discussed and let me know if I can assist you in the future.   Screening recommendations/referrals: Colonoscopy: completed 08/11/2013, due 08/12/2023 Recommended yearly ophthalmology/optometry visit for glaucoma screening and checkup Recommended yearly dental visit for hygiene and checkup  Vaccinations: Influenza vaccine: due Pneumococcal vaccine: completed 11/16/2018 Tdap vaccine: due Shingles vaccine: completed   Covid-19:  12/03/2020, 06/02/2020, 11/10/2019, 05/04/2019, 04/13/2019  Advanced directives: Please bring a copy of your POA (Power of Attorney) and/or Living Will to your next appointment.   Conditions/risks identified: none  Next appointment: Follow up in one year for your annual wellness visit.   Preventive Care 24 Years and Older, Male Preventive care refers to lifestyle choices and visits with your health care provider that can promote health and wellness. What does preventive care include? A yearly physical exam. This is also called an annual well check. Dental exams once or twice a year. Routine eye exams. Ask your health care provider how often you should have your eyes checked. Personal lifestyle choices, including: Daily care of your teeth and gums. Regular physical activity. Eating a healthy diet. Avoiding tobacco and drug use. Limiting alcohol use. Practicing safe sex. Taking low doses of aspirin every day. Taking vitamin and mineral supplements as recommended by your health care provider. What happens during an annual well check? The services and screenings done by your health care provider during your annual well check will depend on your age, overall health, lifestyle risk factors, and family history of disease. Counseling  Your health care provider may ask you questions about your: Alcohol  use. Tobacco use. Drug use. Emotional well-being. Home and relationship well-being. Sexual activity. Eating habits. History of falls. Memory and ability to understand (cognition). Work and work Astronomer. Screening  You may have the following tests or measurements: Height, weight, and BMI. Blood pressure. Lipid and cholesterol levels. These may be checked every 5 years, or more frequently if you are over 27 years old. Skin check. Lung cancer screening. You may have this screening every year starting at age 3 if you have a 30-pack-year history of smoking and currently smoke or have quit within the past 15 years. Fecal occult blood test (FOBT) of the stool. You may have this test every year starting at age 26. Flexible sigmoidoscopy or colonoscopy. You may have a sigmoidoscopy every 5 years or a colonoscopy every 10 years starting at age 32. Prostate cancer screening. Recommendations will vary depending on your family history and other risks. Hepatitis C blood test. Hepatitis B blood test. Sexually transmitted disease (STD) testing. Diabetes screening. This is done by checking your blood sugar (glucose) after you have not eaten for a while (fasting). You may have this done every 1-3 years. Abdominal aortic aneurysm (AAA) screening. You may need this if you are a current or former smoker. Osteoporosis. You may be screened starting at age 50 if you are at high risk. Talk with your health care provider about your test results, treatment options, and if necessary, the need for more tests. Vaccines  Your health care provider may recommend certain vaccines, such as: Influenza vaccine. This is recommended every year. Tetanus, diphtheria, and acellular pertussis (Tdap, Td) vaccine. You may need a Td booster every 10 years. Zoster vaccine. You may need this after age 58. Pneumococcal 13-valent conjugate (PCV13) vaccine. One dose is recommended  after age 52. Pneumococcal polysaccharide  (PPSV23) vaccine. One dose is recommended after age 35. Talk to your health care provider about which screenings and vaccines you need and how often you need them. This information is not intended to replace advice given to you by your health care provider. Make sure you discuss any questions you have with your health care provider. Document Released: 03/17/2015 Document Revised: 11/08/2015 Document Reviewed: 12/20/2014 Elsevier Interactive Patient Education  2017 [place] Prevention in the Home Falls can cause injuries. They can happen to people of all ages. There are many things you can do to make your home safe and to help prevent falls. What can I do on the outside of my home? Regularly fix the edges of walkways and driveways and fix any cracks. Remove anything that might make you trip as you walk through a door, such as a raised step or threshold. Trim any bushes or trees on the path to your home. Use bright outdoor lighting. Clear any walking paths of anything that might make someone trip, such as rocks or tools. Regularly check to see if handrails are loose or broken. Make sure that both sides of any steps have handrails. Any raised decks and porches should have guardrails on the edges. Have any leaves, snow, or ice cleared regularly. Use sand or salt on walking paths during winter. Clean up any spills in your garage right away. This includes oil or grease spills. What can I do in the bathroom? Use night lights. Install grab bars by the toilet and in the tub and shower. Do not use towel bars as grab bars. Use non-skid mats or decals in the tub or shower. If you need to sit down in the shower, use a plastic, non-slip stool. Keep the floor dry. Clean up any water that spills on the floor as soon as it happens. Remove soap buildup in the tub or shower regularly. Attach bath mats securely with double-sided non-slip rug tape. Do not have throw rugs and other things on the  floor that can make you trip. What can I do in the bedroom? Use night lights. Make sure that you have a light by your bed that is easy to reach. Do not use any sheets or blankets that are too big for your bed. They should not hang down onto the floor. Have a firm chair that has side arms. You can use this for support while you get dressed. Do not have throw rugs and other things on the floor that can make you trip. What can I do in the kitchen? Clean up any spills right away. Avoid walking on wet floors. Keep items that you use a lot in easy-to-reach places. If you need to reach something above you, use a strong step stool that has a grab bar. Keep electrical cords out of the way. Do not use floor polish or wax that makes floors slippery. If you must use wax, use non-skid floor wax. Do not have throw rugs and other things on the floor that can make you trip. What can I do with my stairs? Do not leave any items on the stairs. Make sure that there are handrails on both sides of the stairs and use them. Fix handrails that are broken or loose. Make sure that handrails are as long as the stairways. Check any carpeting to make sure that it is firmly attached to the stairs. Fix any carpet that is loose or worn. Avoid having throw  rugs at the top or bottom of the stairs. If you do have throw rugs, attach them to the floor with carpet tape. Make sure that you have a light switch at the top of the stairs and the bottom of the stairs. If you do not have them, ask someone to add them for you. What else can I do to help prevent falls? Wear shoes that: Do not have high heels. Have rubber bottoms. Are comfortable and fit you well. Are closed at the toe. Do not wear sandals. If you use a stepladder: Make sure that it is fully opened. Do not climb a closed stepladder. Make sure that both sides of the stepladder are locked into place. Ask someone to hold it for you, if possible. Clearly mark and make  sure that you can see: Any grab bars or handrails. First and last steps. Where the edge of each step is. Use tools that help you move around (mobility aids) if they are needed. These include: Canes. Walkers. Scooters. Crutches. Turn on the lights when you go into a dark area. Replace any light bulbs as soon as they burn out. Set up your furniture so you have a clear path. Avoid moving your furniture around. If any of your floors are uneven, fix them. If there are any pets around you, be aware of where they are. Review your medicines with your doctor. Some medicines can make you feel dizzy. This can increase your chance of falling. Ask your doctor what other things that you can do to help prevent falls. This information is not intended to replace advice given to you by your health care provider. Make sure you discuss any questions you have with your health care provider. Document Released: 12/15/2008 Document Revised: 07/27/2015 Document Reviewed: 03/25/2014 Elsevier Interactive Patient Education  2017 [employer].

## 2021-10-16 NOTE — Telephone Encounter (Signed)
Pt advised that medication is being prescribed by another provider. Pt stated that he knows this but they were wanting pt to do a follow up appt. Pt advised to f/u for refills. Pt verbalized understanding.

## 2021-10-18 ENCOUNTER — Other Ambulatory Visit: Payer: Self-pay | Admitting: Adult Health

## 2021-10-18 ENCOUNTER — Encounter: Payer: Self-pay | Admitting: Adult Health

## 2021-10-18 DIAGNOSIS — F5101 Primary insomnia: Secondary | ICD-10-CM

## 2021-10-18 MED ORDER — ZOLPIDEM TARTRATE 10 MG PO TABS: 10.0000 mg | ORAL_TABLET | Freq: Every evening | ORAL | 2 refills | Status: DC | PRN

## 2021-10-18 NOTE — Telephone Encounter (Signed)
Ok to fill 

## 2021-10-23 NOTE — Addendum Note (Signed)
Addended by: Verner Chol on: 10/23/2021 01:22 PM   Modules accepted: Orders

## 2021-10-23 NOTE — Telephone Encounter (Signed)
Patient preferred to continue with Cialis as he felt this was helping with his urination. Removed sildenafil from medication list and plan to continue only with Cialis daily.

## 2021-10-23 NOTE — Chronic Care Management (AMB) (Signed)
Patient did state he was not taking both sildenafil and cialis.  He is currently only taking Cialis. Sildenafil has been canceled with [facility].

## 2021-10-25 ENCOUNTER — Ambulatory Visit (INDEPENDENT_AMBULATORY_CARE_PROVIDER_SITE_OTHER): Payer: PPO | Admitting: Adult Health

## 2021-10-25 ENCOUNTER — Encounter: Payer: Self-pay | Admitting: Adult Health

## 2021-10-25 VITALS — BP 130/80 | HR 102 | Temp 98.6°F | Ht 68.75 in | Wt 183.0 lb

## 2021-10-25 DIAGNOSIS — F419 Anxiety disorder, unspecified: Secondary | ICD-10-CM

## 2021-10-25 DIAGNOSIS — N401 Enlarged prostate with lower urinary tract symptoms: Secondary | ICD-10-CM

## 2021-10-25 DIAGNOSIS — Z0000 Encounter for general adult medical examination without abnormal findings: Secondary | ICD-10-CM

## 2021-10-25 DIAGNOSIS — I10 Essential (primary) hypertension: Secondary | ICD-10-CM

## 2021-10-25 DIAGNOSIS — E785 Hyperlipidemia, unspecified: Secondary | ICD-10-CM | POA: Diagnosis not present

## 2021-10-25 DIAGNOSIS — R351 Nocturia: Secondary | ICD-10-CM | POA: Diagnosis not present

## 2021-10-25 DIAGNOSIS — M159 Polyosteoarthritis, unspecified: Secondary | ICD-10-CM | POA: Diagnosis not present

## 2021-10-25 DIAGNOSIS — F5101 Primary insomnia: Secondary | ICD-10-CM

## 2021-10-25 DIAGNOSIS — M150 Primary generalized (osteo)arthritis: Secondary | ICD-10-CM

## 2021-10-25 DIAGNOSIS — F1010 Alcohol abuse, uncomplicated: Secondary | ICD-10-CM

## 2021-10-25 LAB — COMPREHENSIVE METABOLIC PANEL
ALT: 17 U/L (ref 0–53)
AST: 19 U/L (ref 0–37)
Albumin: 4.2 g/dL (ref 3.5–5.2)
Alkaline Phosphatase: 53 U/L (ref 39–117)
BUN: 22 mg/dL (ref 6–23)
CO2: 26 mEq/L (ref 19–32)
Calcium: 8.7 mg/dL (ref 8.4–10.5)
Chloride: 105 mEq/L (ref 96–112)
Creatinine, Ser: 1.43 mg/dL (ref 0.40–1.50)
GFR: 48.56 mL/min — ABNORMAL LOW (ref >60.00)
Glucose, Bld: 89 mg/dL (ref 70–99)
Potassium: 4.1 mEq/L (ref 3.5–5.1)
Sodium: 139 mEq/L (ref 135–145)
Total Bilirubin: 0.7 mg/dL (ref 0.2–1.2)
Total Protein: 6.5 g/dL (ref 6.0–8.3)

## 2021-10-25 LAB — FOLATE: Folate: 20.9 ng/mL (ref >5.9)

## 2021-10-25 LAB — CBC WITH DIFFERENTIAL/PLATELET
Basophils Absolute: 0.1 10*3/uL (ref 0.0–0.1)
Basophils Relative: 1.3 % (ref 0.0–3.0)
Eosinophils Absolute: 0.1 10*3/uL (ref 0.0–0.7)
Eosinophils Relative: 3.3 % (ref 0.0–5.0)
HCT: 40 % (ref 39.0–52.0)
Hemoglobin: 13.5 g/dL (ref 13.0–17.0)
Lymphocytes Relative: 19.1 % (ref 12.0–46.0)
Lymphs Abs: 0.8 10*3/uL (ref 0.7–4.0)
MCHC: 33.7 g/dL (ref 30.0–36.0)
MCV: 91 fl (ref 78.0–100.0)
Monocytes Absolute: 0.5 10*3/uL (ref 0.1–1.0)
Monocytes Relative: 10.6 % (ref 3.0–12.0)
Neutro Abs: 2.9 10*3/uL (ref 1.4–7.7)
Neutrophils Relative %: 65.7 % (ref 43.0–77.0)
Platelets: 167 10*3/uL (ref 150.0–400.0)
RBC: 4.4 Mil/uL (ref 4.22–5.81)
RDW: 13.3 % (ref 11.5–15.5)
WBC: 4.4 10*3/uL (ref 4.0–10.5)

## 2021-10-25 LAB — LIPID PANEL
Cholesterol: 166 mg/dL (ref 0–200)
HDL: 45.8 mg/dL (ref >39.00)
LDL Cholesterol: 102 mg/dL — ABNORMAL HIGH (ref 0–99)
NonHDL: 120.57
Total CHOL/HDL Ratio: 4
Triglycerides: 93 mg/dL (ref 0.0–149.0)
VLDL: 18.6 mg/dL (ref 0.0–40.0)

## 2021-10-25 LAB — TSH: TSH: 1.87 u[IU]/mL (ref 0.35–5.50)

## 2021-10-25 LAB — PSA: PSA: 1.8 ng/mL (ref 0.10–4.00)

## 2021-10-25 LAB — VITAMIN B12: Vitamin B-12: 418 pg/mL (ref 211–911)

## 2021-10-25 MED ORDER — LISINOPRIL 40 MG PO TABS: 40.0000 mg | ORAL_TABLET | Freq: Every day | ORAL | 3 refills | Status: DC

## 2021-10-25 NOTE — Progress Notes (Signed)
Subjective:    Patient ID: Harold Wilson., male    DOB: [birth year], 76 y.o.   MRN: [id number]  HPI Patient presents for yearly preventative medicine examination. He is a pleasant 76 year old male who  has a past medical history of BENIGN PROSTATIC HYPERTROPHY (02/06/2007), COLONIC POLYPS, HX OF (02/06/2007), COVID (09/18/2020), DIVERTICULOSIS, COLON (02/06/2007), ERECTILE DYSFUNCTION, NON-ORGANIC, MILD (02/23/2009), HYPERLIPIDEMIA (02/22/2008), Hypertension, and OSTEOARTHRITIS (02/06/2007).  Hypertension-managed with Norvasc 10 mg daily and lisinopril 40 mg daily.  He does check his blood pressure at home periodically and reports readings in the 120s to 140s over 70s to 80s.  He denies chest pain or shortness of breath. Intermittently he will have a moment of lightheadedness when standing up after sitting for a long period of time.  BP Readings from Last 3 Encounters:  10/25/21 130/80  08/13/21 135/83  06/28/21 120/70   Anxiety-managed with Prozac 20 mg daily.  He does feel well controlled on this medication  Osteoarthritis of multiple joints-currently treated with naproxen 500 mg twice daily, tramadol 50 to 100 mg twice daily, and Flexeril 5 mg 3 times daily as needed  BPH-uses Flomax 0.4 mg daily and Cialis 5 mg daily.  Insomnia-due to Ambien 10 mg nightly.  He does feel as though he gets a restful night sleep without side effects such as grogginess, dizziness, or lightheadedness when waking up in the morning  Alcohol Abuse- has cut back and is only have a drink a day.    All immunizations and health maintenance protocols were reviewed with the patient and needed orders were placed.  Appropriate screening laboratory values were ordered for the patient including screening of hyperlipidemia, renal function and hepatic function. If indicated by BPH, a PSA was ordered.  Medication reconciliation,  past medical history, social history, problem list and allergies were reviewed in  detail with the patient  Goals were established with regard to weight loss, exercise, and  diet in compliance with medications. He does stay active and tries to eat healthy   Wt Readings from Last 3 Encounters:  10/25/21 183 lb (83 kg)  10/16/21 195 lb (88.5 kg)  06/28/21 195 lb (88.5 kg)    Review of Systems  Constitutional: Negative.   HENT: Negative.    Eyes: Negative.   Respiratory: Negative.    Cardiovascular: Negative.   Gastrointestinal: Negative.   Endocrine: Negative.   Genitourinary: Negative.   Musculoskeletal:  Positive for arthralgias and back pain.  Skin: Negative.   Allergic/Immunologic: Negative.   Neurological: Negative.   Hematological: Negative.   Psychiatric/Behavioral: Negative.    All other systems reviewed and are negative.  Past Medical History:  Diagnosis Date   BENIGN PROSTATIC HYPERTROPHY 02/06/2007   COLONIC POLYPS, HX OF 02/06/2007   COVID 09/18/2020   per pt   DIVERTICULOSIS, COLON 02/06/2007   ERECTILE DYSFUNCTION, NON-ORGANIC, MILD 02/23/2009   HYPERLIPIDEMIA 02/22/2008   Hypertension    OSTEOARTHRITIS 02/06/2007    Social History   Socioeconomic History   Marital status: Married    Spouse name: Not on file   Number of children: 5   Years of education: Not on file   Highest education level: Not on file  Occupational History   Occupation: retired  Tobacco Use   Smoking status: Former   Smokeless tobacco: Never  Building services engineer Use: Never used  Substance and Sexual Activity   Alcohol use: Yes    Alcohol/week: 1.0 standard drink of alcohol    Types:  1 Cans of beer per week   Drug use: No   Sexual activity: Not on file  Other Topics Concern   Not on file  Social History Narrative   Retired    Married    Social Determinants of Health   Financial Resource Strain: [place]  (10/16/2021)   Overall Financial Resource Strain (CARDIA)    Difficulty of Paying Living Expenses: Not hard at all  Food Insecurity: No Food  Insecurity (10/16/2021)   Hunger Vital Sign    Worried About Running Out of Food in the Last Year: Never true    Ran Out of Food in the Last Year: Never true  Transportation Needs: No Transportation Needs (10/16/2021)   PRAPARE - Hydrologist (Medical): No    Lack of Transportation (Non-Medical): No  Physical Activity: Inactive (10/16/2021)   Exercise Vital Sign    Days of Exercise per Week: 0 days    Minutes of Exercise per Session: 0 min  Stress: No Stress Concern Present (10/16/2021)   [place]    Feeling of Stress : Not at all  Social Connections: Moderately Integrated (10/10/2020)   Social Connection and Isolation Panel [NHANES]    Frequency of Communication with Friends and Family: More than three times a week    Frequency of Social Gatherings with Friends and Family: More than three times a week    Attends Religious Services: Never    Marine scientist or Organizations: Yes    Attends Archivist Meetings: 1 to 4 times per year    Marital Status: Married  Human resources officer Violence: Not At Risk (10/10/2020)   Humiliation, Afraid, Rape, and Kick questionnaire    Fear of Current or Ex-Partner: No    Emotionally Abused: No    Physically Abused: No    Sexually Abused: No    Past Surgical History:  Procedure Laterality Date   CERVICAL SPINE SURGERY     ROTATOR CUFF REPAIR     TEMPOROMANDIBULAR JOINT SURGERY     TONSILLECTOMY     VASECTOMY      Family History  Problem Relation Age of Onset   COPD Mother    Heart disease Mother    Heart failure Mother    Cancer Father        lung ca    Allergies  Allergen Reactions   Nickel Rash   Sulfacetamide Sodium     Dryness of mouth     Current Outpatient Medications on File Prior to Visit  Medication Sig Dispense Refill   amLODipine (NORVASC) 10 MG tablet TAKE ONE TABLET BY MOUTH DAILY 90 tablet 2   brimonidine  (ALPHAGAN) 0.2 % ophthalmic solution      cyclobenzaprine (FLEXERIL) 5 MG tablet TAKE ONE TABLET BY MOUTH THREE TIMES A DAY AS NEEDED FOR MUSCLE SPASMS 90 tablet 1   FLUoxetine (PROZAC) 40 MG capsule Take 1 capsule (40 mg total) by mouth daily. 90 capsule 3   fluticasone (FLONASE) 50 MCG/ACT nasal spray Place 2 sprays into both nostrils daily. 48 g 0   gabapentin (NEURONTIN) 100 MG capsule Take 1-2 capsules (100-200 mg total) by mouth 3 (three) times daily as needed. 30 capsule 3   lisinopril (ZESTRIL) 40 MG tablet Take 1 tablet (40 mg total) by mouth daily. 90 tablet 3   naproxen (NAPROXEN DR) 500 MG EC tablet Take 1 tablet (500 mg total) by mouth 2 (two) times  daily with a meal. 180 tablet 1   tadalafil (CIALIS) 5 MG tablet Take 5 mg by mouth daily.     tamsulosin (FLOMAX) 0.4 MG CAPS capsule Take 1 capsule (0.4 mg total) by mouth daily. 90 capsule 1   tiZANidine (ZANAFLEX) 4 MG tablet Take 0.5-1 tablets (2-4 mg total) by mouth every 8 (eight) hours as needed for muscle spasms. 30 tablet 2   traMADol (ULTRAM) 50 MG tablet TAKE 1 TO 2 TABLETS BY MOUTH DAILY AS NEEDED 20 tablet 2   zolpidem (AMBIEN) 10 MG tablet Take 1 tablet (10 mg total) by mouth at bedtime as needed. 30 tablet 2   No current facility-administered medications on file prior to visit.    BP 130/80   Pulse (!) 102   Temp 98.6 F (37 C) (Oral)   Ht 5' 8.75" (1.746 m)   Wt 183 lb (83 kg)   SpO2 95%   BMI 27.22 kg/m       Objective:   Physical Exam Vitals and nursing note reviewed.  Constitutional:      General: He is not in acute distress.    Appearance: Normal appearance. He is well-developed and normal weight.  HENT:     Head: Normocephalic and atraumatic.     Right Ear: Tympanic membrane, ear canal and external ear normal. There is no impacted cerumen.     Left Ear: Tympanic membrane, ear canal and external ear normal. There is no impacted cerumen.     Nose: Nose normal. No congestion or rhinorrhea.      Mouth/Throat:     Mouth: Mucous membranes are moist.     Pharynx: Oropharynx is clear. No oropharyngeal exudate or posterior oropharyngeal erythema.  Eyes:     General:        Right eye: No discharge.        Left eye: No discharge.     Extraocular Movements: Extraocular movements intact.     Conjunctiva/sclera: Conjunctivae normal.     Pupils: Pupils are equal, round, and reactive to light.  Neck:     Vascular: No carotid bruit.     Trachea: No tracheal deviation.  Cardiovascular:     Rate and Rhythm: Normal rate and regular rhythm.     Pulses: Normal pulses.     Heart sounds: Normal heart sounds. No murmur heard.    No friction rub. No gallop.  Pulmonary:     Effort: Pulmonary effort is normal. No respiratory distress.     Breath sounds: Normal breath sounds. No stridor. No wheezing, rhonchi or rales.  Chest:     Chest wall: No tenderness.  Abdominal:     General: Bowel sounds are normal. There is no distension.     Palpations: Abdomen is soft. There is no mass.     Tenderness: There is no abdominal tenderness. There is no right CVA tenderness, left CVA tenderness, guarding or rebound.     Hernia: No hernia is present.  Musculoskeletal:        General: No swelling, tenderness, deformity or signs of injury. Normal range of motion.     Right lower leg: No edema.     Left lower leg: No edema.  Lymphadenopathy:     Cervical: No cervical adenopathy.  Skin:    General: Skin is warm and dry.     Capillary Refill: Capillary refill takes less than 2 seconds.     Coloration: Skin is not jaundiced or pale.     Findings: No  bruising, erythema, lesion or rash.  Neurological:     General: No focal deficit present.     Mental Status: He is alert and oriented to person, place, and time.     Cranial Nerves: No cranial nerve deficit.     Sensory: No sensory deficit.     Motor: No weakness.     Coordination: Coordination normal.     Gait: Gait normal.     Deep Tendon Reflexes: Reflexes  normal.  Psychiatric:        Mood and Affect: Mood normal.        Behavior: Behavior normal.        Thought Content: Thought content normal.        Judgment: Judgment normal.       Assessment & Plan:   1. Routine general medical examination at a health care facility - Work on lifestyle modifications and eat healthy  - Follow up in 1 year or sooner if needed - CBC with Differential/Platelet - Comprehensive metabolic panel - Lipid panel - TSH  2. Essential hypertension - Well controlled. No change in medications  - lisinopril (ZESTRIL) 40 MG tablet; Take 1 tablet (40 mg total) by mouth daily.  Dispense: 90 tablet; Refill: 3 - CBC with Differential/Platelet - Comprehensive metabolic panel - Lipid panel - TSH  3. Primary insomnia - Continue ambien 10 mg QHS - CBC with Differential/Platelet - Comprehensive metabolic panel - Lipid panel - TSH  4. Dyslipidemia - Consider increase in statin  - CBC with Differential/Platelet - Comprehensive metabolic panel - Lipid panel - TSH  5. Benign prostatic hyperplasia with nocturia  - PSA  6. Anxiety - Continue with Prozac  - CBC with Differential/Platelet - Comprehensive metabolic panel - Lipid panel - TSH  7. Alcohol abuse  - CBC with Differential/Platelet - Comprehensive metabolic panel - Lipid panel - TSH - Folate; Future - Vitamin B12; Future  8. Primary osteoarthritis involving multiple joints - Continue with pain management techniques  - Follow up with myself or orthopedics as directed  Dorothyann Peng, NP

## 2021-10-25 NOTE — Patient Instructions (Addendum)
It was great seeing you today   We will follow up with you regarding your lab work   Please let me know if you need anything   

## 2021-11-01 ENCOUNTER — Telehealth: Payer: Self-pay | Admitting: Adult Health

## 2021-11-01 DIAGNOSIS — N401 Enlarged prostate with lower urinary tract symptoms: Secondary | ICD-10-CM

## 2021-11-01 MED ORDER — TAMSULOSIN HCL 0.4 MG PO CAPS: 0.4000 mg | ORAL_CAPSULE | Freq: Every day | ORAL | 1 refills | Status: DC

## 2021-11-01 MED ORDER — FLUTICASONE PROPIONATE 50 MCG/ACT NA SUSP: 2.0000 | Freq: Every day | NASAL | 0 refills | Status: DC

## 2021-11-01 NOTE — Telephone Encounter (Signed)
Pt notified that Rx sent to pharmacy.

## 2021-11-01 NOTE — Telephone Encounter (Signed)
Pt called to say he was just here on 10/25/21 - for his CPE, and has not received any refills for the following prescriptions:  FLUoxetine (PROZAC) 40 MG capsule  fluticasone (FLONASE) 50 MCG/ACT nasal spray  tamsulosin (FLOMAX) 0.4 MG CAPS capsule  Please advise.  Chartered loss adjuster [facility]) - [place], [place] - [address] [place] Phone:  [phone]  Fax:  [phone]

## 2021-11-03 ENCOUNTER — Other Ambulatory Visit: Payer: Self-pay | Admitting: Adult Health

## 2021-11-03 DIAGNOSIS — M159 Polyosteoarthritis, unspecified: Secondary | ICD-10-CM

## 2021-11-07 ENCOUNTER — Encounter: Payer: Self-pay | Admitting: Adult Health

## 2021-11-07 ENCOUNTER — Other Ambulatory Visit: Payer: Self-pay | Admitting: Adult Health

## 2021-11-07 DIAGNOSIS — M159 Polyosteoarthritis, unspecified: Secondary | ICD-10-CM

## 2021-11-07 MED ORDER — CYCLOBENZAPRINE HCL 5 MG PO TABS: ORAL_TABLET | ORAL | 1 refills | Status: DC

## 2021-11-07 NOTE — Telephone Encounter (Signed)
Last OV 10/25/21

## 2021-11-16 ENCOUNTER — Telehealth: Payer: Self-pay | Admitting: Pharmacist

## 2021-11-16 NOTE — Chronic Care Management (AMB) (Signed)
Chronic Care Management Pharmacy Assistant   Name: Harold Wilson.  MRN: [id number] DOB: 24-Apr-1945  Reason for Encounter: Disease State / Hypertension Assessment Call   Conditions to be addressed/monitored: HTN  Recent office visits:  10/25/2021 Harold Frees NP - Patient was seen for Routine general medical examination at a health care facility and additional concerns. No medication changes. Follow up in 1 year.   Recent consult visits:  09/14/2021 Harold Wilson (osteopath) - Patient was seen for Pinguecula, bilateral and additional concerns. No additional chart notes.   Hospital visits:  None  Medications: Outpatient Encounter Medications as of 11/16/2021  Medication Sig   amLODipine (NORVASC) 10 MG tablet TAKE ONE TABLET BY MOUTH DAILY   brimonidine (ALPHAGAN) 0.2 % ophthalmic solution    cyclobenzaprine (FLEXERIL) 5 MG tablet TAKE ONE TABLET BY MOUTH THREE TIMES A DAY AS NEEDED FOR MUSCLE SPASMS   FLUoxetine (PROZAC) 40 MG capsule Take 1 capsule (40 mg total) by mouth daily.   fluticasone (FLONASE) 50 MCG/ACT nasal spray Place 2 sprays into both nostrils daily.   lisinopril (ZESTRIL) 40 MG tablet Take 1 tablet (40 mg total) by mouth daily.   naproxen (NAPROXEN DR) 500 MG EC tablet Take 1 tablet (500 mg total) by mouth 2 (two) times daily with a meal.   tadalafil (CIALIS) 5 MG tablet Take 5 mg by mouth daily.   tamsulosin (FLOMAX) 0.4 MG CAPS capsule Take 1 capsule (0.4 mg total) by mouth daily.   traMADol (ULTRAM) 50 MG tablet TAKE 1 TO 2 TABLETS BY MOUTH DAILY AS NEEDED   zolpidem (AMBIEN) 10 MG tablet Take 1 tablet (10 mg total) by mouth at bedtime as needed.   No facility-administered encounter medications on file as of 11/16/2021.  Fill History: AMLODIPINE 10 MG TABLET 07/21/2021 90   brimonidine (ALPHAGAN) ophthalmic solution 0.2 % 09/08/2021 90   cyclobenzaprine (FLEXERIL) tablet 09/21/2021 30   FLUTICASONE PROPIONATE 50 MCG/ACTUATION SPRAY, SUSPENSION  03/01/2021 90   lisinopril (PRINIVIL,ZESTRIL) tablet 10/25/2021 90   NAPROXEN 500 MG TABLET, DELAYED RELEASE (ENTERIC COATED) 03/01/2021 90   TAMSULOSIN 0.4 MG CAPSULE 06/25/2021 90   zolpidem (AMBIEN) tablet 10/28/2021 30   Reviewed chart prior to disease state call. Spoke with patient regarding BP  Recent Office Vitals: BP Readings from Last 3 Encounters:  10/25/21 130/80  08/13/21 135/83  06/28/21 120/70   Pulse Readings from Last 3 Encounters:  10/25/21 (!) 102  08/13/21 74  06/28/21 97    Wt Readings from Last 3 Encounters:  10/25/21 183 lb (83 kg)  10/16/21 195 lb (88.5 kg)  06/28/21 195 lb (88.5 kg)     Kidney Function Lab Results  Component Value Date/Time   CREATININE 1.43 10/25/2021 10:56 AM   CREATININE 1.46 06/28/2021 11:44 AM   CREATININE 1.29 (H) 10/05/2019 09:54 AM   CREATININE 1.06 03/13/2018 03:49 PM   GFR 48.56 (L) 10/25/2021 10:56 AM   GFRNONAA 55 (L) 10/05/2019 09:54 AM   GFRAA 64 10/05/2019 09:54 AM       Latest Ref Rng & Units 10/25/2021   10:56 AM 06/28/2021   11:44 AM 10/24/2020   10:17 AM  BMP  Glucose 70 - 99 mg/dL 89  98  88   BUN 6 - 23 mg/dL 22  21  23    Creatinine 0.40 - 1.50 mg/dL  2.35  5.73   Sodium 135 - 145 mEq/L 139  140  140   Potassium 3.5 - 5.1 mEq/L 4.1  4.3  4.2   Chloride 96 - 112 mEq/L 105  107  107   CO2 19 - 32 mEq/L 26  27  26    Calcium 8.4 - 10.5 mg/dL 8.7  8.6  8.7     Current antihypertensive regimen:  Amlodipine 10 mg daily Lisinopril 40 mg daily  How often are you checking your Blood Pressure?   Current home BP readings:   What recent interventions/DTPs have been made by any provider to improve Blood Pressure control since last CPP Visit: No recent interventions.  Any recent hospitalizations or ED visits since last visit with CPP? No recent hospital visits.   What diet changes have been made to improve Blood Pressure Control?   What exercise is being done to improve your Blood Pressure Control?     Adherence Review: Is the patient currently on ACE/ARB medication? Yes Does the patient have >5 day gap between last estimated fill dates? No  Unable to reach patient after several attempts  Care Gaps: AWV - completed 10/16/2021 Last BP - 130/80 on 10/25/2021 Covid booster - overdue Tdap - overdue Flu - due  Star Rating Drugs: Lisinopril 40 mg - last filled 10/25/2021 90 DS at 10/27/2021 [facility]  Clinical Pharmacist Assistant [phone]

## 2022-01-02 ENCOUNTER — Other Ambulatory Visit: Payer: Self-pay

## 2022-01-02 DIAGNOSIS — I10 Essential (primary) hypertension: Secondary | ICD-10-CM

## 2022-01-02 MED ORDER — LISINOPRIL 40 MG PO TABS: 40.0000 mg | ORAL_TABLET | Freq: Every day | ORAL | 3 refills | Status: DC

## 2022-01-15 DIAGNOSIS — H40023 Open angle with borderline findings, high risk, bilateral: Secondary | ICD-10-CM | POA: Diagnosis not present

## 2022-01-15 DIAGNOSIS — H40052 Ocular hypertension, left eye: Secondary | ICD-10-CM | POA: Diagnosis not present

## 2022-01-15 DIAGNOSIS — H2513 Age-related nuclear cataract, bilateral: Secondary | ICD-10-CM | POA: Diagnosis not present

## 2022-01-16 ENCOUNTER — Encounter: Payer: Self-pay | Admitting: Adult Health

## 2022-01-16 DIAGNOSIS — F5101 Primary insomnia: Secondary | ICD-10-CM

## 2022-01-16 MED ORDER — ZOLPIDEM TARTRATE 10 MG PO TABS: 10.0000 mg | ORAL_TABLET | Freq: Every evening | ORAL | 2 refills | Status: DC | PRN

## 2022-01-22 MED ORDER — CYCLOBENZAPRINE HCL 5 MG PO TABS: ORAL_TABLET | ORAL | 1 refills | Status: DC

## 2022-01-31 ENCOUNTER — Other Ambulatory Visit: Payer: Self-pay | Admitting: Adult Health

## 2022-02-10 ENCOUNTER — Other Ambulatory Visit: Payer: Self-pay | Admitting: Cardiovascular Disease

## 2022-02-19 ENCOUNTER — Telehealth: Payer: PPO

## 2022-03-13 ENCOUNTER — Other Ambulatory Visit: Payer: Self-pay | Admitting: Adult Health

## 2022-04-03 ENCOUNTER — Encounter: Payer: Self-pay | Admitting: Adult Health

## 2022-04-03 DIAGNOSIS — F39 Unspecified mood [affective] disorder: Secondary | ICD-10-CM

## 2022-04-03 DIAGNOSIS — F5101 Primary insomnia: Secondary | ICD-10-CM

## 2022-04-03 DIAGNOSIS — F1010 Alcohol abuse, uncomplicated: Secondary | ICD-10-CM

## 2022-04-03 DIAGNOSIS — M159 Polyosteoarthritis, unspecified: Secondary | ICD-10-CM

## 2022-04-04 MED ORDER — ZOLPIDEM TARTRATE 10 MG PO TABS: 10.0000 mg | ORAL_TABLET | Freq: Every evening | ORAL | 2 refills | Status: DC | PRN

## 2022-04-04 MED ORDER — FLUOXETINE HCL 40 MG PO CAPS: 40.0000 mg | ORAL_CAPSULE | Freq: Every day | ORAL | 1 refills | Status: DC

## 2022-04-09 MED ORDER — CYCLOBENZAPRINE HCL 5 MG PO TABS: ORAL_TABLET | ORAL | 0 refills | Status: DC

## 2022-04-09 NOTE — Addendum Note (Signed)
Addended by: Gwenyth Ober R on: 04/09/2022 01:19 PM   Modules accepted: Orders

## 2022-04-19 ENCOUNTER — Other Ambulatory Visit: Payer: Self-pay | Admitting: Adult Health

## 2022-05-13 ENCOUNTER — Telehealth: Payer: Self-pay

## 2022-05-13 NOTE — Progress Notes (Unsigned)
Care Management & Coordination Services Pharmacy Team  Reason for Encounter: Hypertension  Contacted patient to discuss hypertension disease state. {US HC Outreach:[id number]}   SCHEDULE FU IN 2-3 MOS Current antihypertensive regimen:  Amlodipine 10 mg daily Lisinopril 40 mg daily Patient verbally confirms he is taking the above medications as directed. {yes/no:20286}  How often are you checking your Blood Pressure? {CHL HP BP Monitoring Frequency:[id number]}  he checks his blood pressure {timing:25218} {before/after:25217} taking his medication.  Current home BP readings: *** DATE:             BP               PULSE   Wrist or arm cuff:  OTC medications including pseudoephedrine or NSAIDs?  Any readings above 180/100? {yes/no:20286} If yes any symptoms of hypertensive emergency? {hypertensive emergency symptoms:25354}  What recent interventions/DTPs have been made by any provider to improve Blood Pressure control since last CPP Visit: No recent interventions  Any recent hospitalizations or ED visits since last visit with CPP? No recent hospital visits  What diet changes have been made to improve Blood Pressure Control?  Patient Breakfast -  Lunch -  Dinner -  Caffeine -  Salt -   What exercise is being done to improve your Blood Pressure Control?  ***  Adherence Review: Is the patient currently on ACE/ARB medication? Yes Does the patient have >5 day gap between last estimated fill dates? No  Care Gaps: AWV - completed 10/16/2021 Next appointment -  Last BP - 130/80 on 10/25/2021 Tdap - overdue Flu - due Covid - overdue   Star Rating Drugs: Lisinopril 40 mg - last filled 03/31/2022 90 DS at [facility]   Chart Updates: Recent office visits:  None  Recent consult visits:  None  Hospital visits:  None  Medications: Outpatient Encounter Medications as of 05/13/2022  Medication Sig   amLODipine (NORVASC) 10 MG tablet TAKE ONE TABLET BY MOUTH DAILY    brimonidine (ALPHAGAN) 0.2 % ophthalmic solution    cyclobenzaprine (FLEXERIL) 5 MG tablet TAKE ONE TABLET BY MOUTH THREE TIMES A DAY AS NEEDED FOR MUSCLE SPASMS   FLUoxetine (PROZAC) 40 MG capsule Take 1 capsule (40 mg total) by mouth daily.   fluticasone (FLONASE) 50 MCG/ACT nasal spray Place 2 sprays into both nostrils daily.   lisinopril (ZESTRIL) 40 MG tablet Take 1 tablet (40 mg total) by mouth daily.   naproxen (NAPROXEN DR) 500 MG EC tablet Take 1 tablet (500 mg total) by mouth 2 (two) times daily with a meal.   tadalafil (CIALIS) 5 MG tablet TAKE 1 TABLET BY MOUTH DAILY   tamsulosin (FLOMAX) 0.4 MG CAPS capsule Take 1 capsule (0.4 mg total) by mouth daily.   traMADol (ULTRAM) 50 MG tablet TAKE 1 TO 2 TABLETS BY MOUTH DAILY AS NEEDED   zolpidem (AMBIEN) 10 MG tablet Take 1 tablet (10 mg total) by mouth at bedtime as needed.   No facility-administered encounter medications on file as of 05/13/2022.  Fill History:  Dispensed Days Supply Quantity Provider Pharmacy  AMLODIPINE 10 MG TABLET 02/13/2022 90 90 tablet      Dispensed Days Supply Quantity Provider Pharmacy  BRIMONIDINE 0.2 % DROPS 03/21/2022 66 10 mL      Dispensed Days Supply Quantity Provider Pharmacy  CYCLOBENZAPRINE 5 MG TABLET 04/09/2022 30 90 tablet      Dispensed Days Supply Quantity Provider Pharmacy  FLUOXETINE 40 MG CAPSULE 04/08/2022 90 90 capsule      Dispensed Days  Supply Quantity Provider Pharmacy  FLUTICASONE PROPIONATE 50 MCG/ACTUATION SPRAY, SUSPENSION 11/02/2021 90 48 mL      Dispensed Days Supply Quantity Provider Pharmacy  LISINOPRIL 40 MG TABLET 03/31/2022 90 90 tablet      Dispensed Days Supply Quantity Provider Pharmacy  NAPROXEN 500 MG TABLET, DELAYED RELEASE (ENTERIC COATED) 03/01/2021 90 180 tablet      Dispensed Days Supply Quantity Provider Pharmacy  TAMSULOSIN 0.4 MG CAPSULE 01/10/2022 90 90 capsule      Dispensed Days Supply Quantity Provider Pharmacy  ZOLPIDEM 10 MG TABLET 05/09/2022  30 30 tablet      Recent Office Vitals: BP Readings from Last 3 Encounters:  10/25/21 130/80  08/13/21 135/83  06/28/21 120/70   Pulse Readings from Last 3 Encounters:  10/25/21 (!) 102  08/13/21 74  06/28/21 97    Wt Readings from Last 3 Encounters:  10/25/21 183 lb (83 kg)  10/16/21 195 lb (88.5 kg)  06/28/21 195 lb (88.5 kg)     Kidney Function Lab Results  Component Value Date/Time   CREATININE 1.43 10/25/2021 10:56 AM   CREATININE 1.46 06/28/2021 11:44 AM   CREATININE 1.29 (H) 10/05/2019 09:54 AM   CREATININE 1.06 03/13/2018 03:49 PM   GFR 48.56 (L) 10/25/2021 10:56 AM   GFRNONAA 55 (L) 10/05/2019 09:54 AM   GFRAA 64 10/05/2019 09:54 AM       Latest Ref Rng & Units 10/25/2021   10:56 AM 06/28/2021   11:44 AM 10/24/2020   10:17 AM  BMP  Glucose 70 - 99 mg/dL 89  98  88   BUN 6 - 23 mg/dL '22  21  23   '$ Creatinine 0.40 - 1.50 mg/dL 1.43  1.46  1.25   Sodium 135 - 145 mEq/L 139  140  140   Potassium 3.5 - 5.1 mEq/L 4.1  4.3  4.2   Chloride 96 - 112 mEq/L 105  107  107   CO2 19 - 32 mEq/L '26  27  26   '$ Calcium 8.4 - 10.5 mg/dL 8.7  8.6  8.7    [place] Pharmacist Assistant [phone]

## 2022-05-14 DIAGNOSIS — H1045 Other chronic allergic conjunctivitis: Secondary | ICD-10-CM | POA: Diagnosis not present

## 2022-05-14 DIAGNOSIS — H40052 Ocular hypertension, left eye: Secondary | ICD-10-CM | POA: Diagnosis not present

## 2022-05-14 DIAGNOSIS — H2513 Age-related nuclear cataract, bilateral: Secondary | ICD-10-CM | POA: Diagnosis not present

## 2022-05-14 DIAGNOSIS — H11153 Pinguecula, bilateral: Secondary | ICD-10-CM | POA: Diagnosis not present

## 2022-05-14 DIAGNOSIS — H40023 Open angle with borderline findings, high risk, bilateral: Secondary | ICD-10-CM | POA: Diagnosis not present

## 2022-05-26 ENCOUNTER — Other Ambulatory Visit: Payer: Self-pay | Admitting: Adult Health

## 2022-06-05 ENCOUNTER — Ambulatory Visit: Payer: PPO | Admitting: Orthopaedic Surgery

## 2022-06-05 DIAGNOSIS — M5412 Radiculopathy, cervical region: Secondary | ICD-10-CM

## 2022-06-05 MED ORDER — METHOCARBAMOL 750 MG PO TABS: 750.0000 mg | ORAL_TABLET | Freq: Two times a day (BID) | ORAL | 1 refills | Status: DC | PRN

## 2022-06-05 MED ORDER — GABAPENTIN 100 MG PO CAPS: 100.0000 mg | ORAL_CAPSULE | Freq: Three times a day (TID) | ORAL | 2 refills | Status: AC | PRN

## 2022-06-05 MED ORDER — PREDNISONE 5 MG (21) PO TBPK: ORAL_TABLET | ORAL | 0 refills | Status: DC

## 2022-06-05 NOTE — Progress Notes (Signed)
Office Visit Note   Patient: Harold Wilson.           Date of Birth: 1945/10/11           MRN: [id number] Visit Date: 06/05/2022              Requested by: Dorothyann Peng, NP [address] [place],  [place] [address] PCP: Dorothyann Peng, NP   Assessment & Plan: Visit Diagnoses:  1. Cervical radiculopathy     Plan: Impression is chronic neck pain with associated left upper extremity radiculopathy.  At this point, he has moderate degrees of foraminal and canal stenosis throughout the entire cervical spine.  We have discussed referral back to Dr. Ernestina Patches versus referral to Dr. Laurance Flatten versus referral to Dr. Ellene Route for which she has seen in the past and underwent cervical spine fusion.  Will go ahead and make referral back to Dr. Ernestina Patches for further evaluation and possible ESI.  In the meantime, sent in a steroid pack, muscle relaxer and gabapentin.  He will call with concerns or questions in the meantime.  Follow-Up Instructions: Return if symptoms worsen or fail to improve.   Orders:  No orders of the defined types were placed in this encounter.  Meds ordered this encounter  Medications   predniSONE (STERAPRED UNI-PAK 21 TAB) 5 MG (21) TBPK tablet    Sig: Take as directed    Dispense:  21 tablet    Refill:  0   methocarbamol (ROBAXIN-750) 750 MG tablet    Sig: Take 1 tablet (750 mg total) by mouth 2 (two) times daily as needed for muscle spasms.    Dispense:  20 tablet    Refill:  1   gabapentin (NEURONTIN) 100 MG capsule    Sig: Take 1 capsule (100 mg total) by mouth 3 (three) times daily as needed.    Dispense:  30 capsule    Refill:  2      Procedures: No procedures performed   Clinical Data: No additional findings.   Subjective: Chief Complaint  Patient presents with   Left Arm - Pain   Neck - Pain    HPI patient is a pleasant 77 year old gentleman who comes in today with continued neck pain and paresthesias to the left upper extremity.  He was seen  in our office last year following MRI of the cervical spine.  He was referred to Dr. Ernestina Patches where he underwent C7-T1 ESI.  He notes that the injection helped his pain but not so much the left upper extremity paresthesias.  He notes that these are relatively constant but worse with certain movements.  He has been taking naproxen without significant relief.  Review of Systems as detailed in HPI.  All others reviewed and are negative.   Objective: Vital Signs: There were no vitals taken for this visit.  Physical Exam well-developed well-nourished gentleman in no acute distress.  Alert and oriented x 3.  Ortho Exam cervical spine exam shows slight pain and crepitus with range of motion.  No spinous or paraspinous tenderness.  No significant weakness to the left upper extremity.  He is neurovascular intact distally.  Specialty Comments:  MRI CERVICAL SPINE WITHOUT CONTRAST   TECHNIQUE: Multiplanar, multisequence MR imaging of the cervical spine was performed. No intravenous contrast was administered.   COMPARISON:  Cervical radiographs June 20, 2020.   FINDINGS: Alignment: Similar mild anterolisthesis of C2 on C3 and mild retrolisthesis of C3 on C4.   Vertebrae: C4-C6 ACDF.  No focal marrow edema to suggest acute fracture or discitis/osteomyelitis. No suspicious bone lesions.   Cord: Normal cord signal.  Stroke   Posterior Fossa, vertebral arteries, paraspinal tissues: Visualized vertebral artery flow voids are maintained. No evidence of acute abnormality in the visualized inferior posterior fossa. No paraspinal edema.   Disc levels:   C2-C3: Left facet arthropathy with mild left foraminal stenosis. Posterior disc osteophyte complex and ligamentum flavum thickening with mild to moderate canal stenosis.   C3-C4: Posterior disc osteophyte complex with bilateral facet and uncovertebral hypertrophy. Resulting moderate to severe bilateral foraminal stenosis. Mild canal stenosis.    C4-C5: ACDF. Left greater than right facet uncovertebral hypertrophy. Resulting moderate left and mild right foraminal stenosis. Mild canal stenosis.   C5-C6: ACDF endplate spurring with left greater than right facet and uncovertebral hypertrophy. Resulting moderate left foraminal stenosis. Mild canal stenosis.   C6-C7: Posterior disc osteophyte complex with bilateral facet and uncovertebral hypertrophy. Resulting moderate to severe left and moderate right foraminal stenosis. Mild to moderate canal stenosis.   C7-T1: Posterior disc osteophyte complex with bilateral facet and uncovertebral hypertrophy. Resulting moderate left and mild to moderate right foraminal stenosis. Patent canal.   IMPRESSION: 1. At C3-C4, moderate to severe right greater than left foraminal stenosis with mild canal stenosis. 2. At C6-C7, moderate to severe left and moderate right foraminal stenosis with mild to moderate canal stenosis. 3. At C7-T1, moderate left and mild right foraminal stenosis. 4. At C4-C5 and C5-C6, ACDF with mild canal stenosis and moderate left foraminal stenosis. 5. At C2-C3, mild to moderate canal stenosis and mild left foraminal stenosis.     Electronically Signed   By: Margaretha Sheffield M.D.   On: 05/31/2021 11:48  Imaging: No results found.   [name] History: Patient Active Problem List   Diagnosis Date Noted   Neck pain 06/20/2021   Essential hypertension 01/21/2019   Insomnia 06/02/2012   ADHD (attention deficit hyperactivity disorder) 11/11/2011   ERECTILE DYSFUNCTION, NON-ORGANIC, MILD 02/23/2009   Dyslipidemia 02/22/2008   DIVERTICULOSIS, COLON 02/06/2007   BPH (benign prostatic hyperplasia) 02/06/2007   Osteoarthritis 02/06/2007   History of colonic polyps 02/06/2007   Past Medical History:  Diagnosis Date   BENIGN PROSTATIC HYPERTROPHY 02/06/2007   COLONIC POLYPS, HX OF 02/06/2007   COVID 09/18/2020   per pt   DIVERTICULOSIS, COLON 02/06/2007   ERECTILE  DYSFUNCTION, NON-ORGANIC, MILD 02/23/2009   HYPERLIPIDEMIA 02/22/2008   Hypertension    OSTEOARTHRITIS 02/06/2007    Family History  Problem Relation Age of Onset   COPD Mother    Heart disease Mother    Heart failure Mother    Cancer Father        lung ca    Past Surgical History:  Procedure Laterality Date   CERVICAL SPINE SURGERY     ROTATOR CUFF REPAIR     TEMPOROMANDIBULAR JOINT SURGERY     TONSILLECTOMY     VASECTOMY     Social History   Occupational History   Occupation: retired  Tobacco Use   Smoking status: Former   Smokeless tobacco: Never  Scientific laboratory technician Use: Never used  Substance and Sexual Activity   Alcohol use: Yes    Alcohol/week: 1.0 standard drink of alcohol    Types: 1 Cans of beer per week   Drug use: No   Sexual activity: Not on file

## 2022-06-11 ENCOUNTER — Encounter: Payer: Self-pay | Admitting: Orthopaedic Surgery

## 2022-06-11 ENCOUNTER — Other Ambulatory Visit: Payer: Self-pay | Admitting: Physician Assistant

## 2022-06-11 DIAGNOSIS — M159 Polyosteoarthritis, unspecified: Secondary | ICD-10-CM

## 2022-06-11 MED ORDER — DICLOFENAC SODIUM 75 MG PO TBEC: 75.0000 mg | DELAYED_RELEASE_TABLET | Freq: Two times a day (BID) | ORAL | 2 refills | Status: AC | PRN

## 2022-06-11 MED ORDER — CYCLOBENZAPRINE HCL 5 MG PO TABS
ORAL_TABLET | ORAL | 0 refills | Status: DC
Start: 2022-06-11 — End: 2022-07-19

## 2022-06-11 MED ORDER — TRAMADOL HCL 50 MG PO TABS: ORAL_TABLET | ORAL | 2 refills | Status: AC

## 2022-06-11 NOTE — Telephone Encounter (Signed)
I do not recommend toradol.  I have previously sent in tramdol for which I have refilled.  I also refilled a muscle relaxer and have sent in an nsaid.  I would like him to f/u with Dr. Christell Constant or Dr Danielle Dess for which he has previously seen for chronic management of this as they will be able to help him out more

## 2022-06-13 ENCOUNTER — Telehealth: Payer: Self-pay | Admitting: Adult Health

## 2022-06-13 NOTE — Telephone Encounter (Signed)
Contacted Kelvin Cellar. to schedule their annual wellness visit. Appointment made for 06/14/22.  Rudell Cobb AWV direct phone # [phone]

## 2022-06-14 ENCOUNTER — Telehealth: Payer: Self-pay

## 2022-06-14 NOTE — Telephone Encounter (Signed)
Unsuccessful attempt to reach patient on preferred number listed in notes for scheduled AWV. Left message on voicemail okay to reschedule. 

## 2022-06-19 ENCOUNTER — Ambulatory Visit (INDEPENDENT_AMBULATORY_CARE_PROVIDER_SITE_OTHER): Payer: PPO | Admitting: Physical Medicine and Rehabilitation

## 2022-06-19 ENCOUNTER — Other Ambulatory Visit: Payer: Self-pay

## 2022-06-19 VITALS — BP 119/69 | HR 82

## 2022-06-19 DIAGNOSIS — M5412 Radiculopathy, cervical region: Secondary | ICD-10-CM | POA: Diagnosis not present

## 2022-06-19 MED ORDER — METHYLPREDNISOLONE ACETATE 80 MG/ML IJ SUSP
80.0000 mg | Freq: Once | INTRAMUSCULAR | Status: AC
Start: 2022-06-19 — End: 2022-06-19
  Administered 2022-06-19: 80 mg

## 2022-06-19 NOTE — Progress Notes (Signed)
Functional Pain Scale - descriptive words and definitions  Distracting (5)    Aware of pain/able to complete some ADL's but limited by pain/sleep is affected and active distractions are only slightly useful. Moderate range order  Average Pain  varies   +Driver, -BT, -Dye Allergies.  Neck pain on left side that radiates into left arm

## 2022-06-19 NOTE — Patient Instructions (Signed)

## 2022-06-20 ENCOUNTER — Telehealth: Payer: Self-pay | Admitting: Physical Medicine and Rehabilitation

## 2022-06-20 NOTE — Telephone Encounter (Signed)
Patient wanted to know why he could not see the note and images. Informed patient that the note from Dr. Alvester Morin is not closed yet and will be available once he is done

## 2022-06-20 NOTE — Telephone Encounter (Signed)
Patient asking for someone to call he has a question

## 2022-06-26 ENCOUNTER — Ambulatory Visit: Payer: PPO | Admitting: Adult Health

## 2022-06-29 ENCOUNTER — Encounter: Payer: Self-pay | Admitting: Adult Health

## 2022-06-29 ENCOUNTER — Other Ambulatory Visit: Payer: Self-pay | Admitting: Adult Health

## 2022-06-29 ENCOUNTER — Encounter: Payer: Self-pay | Admitting: Physical Medicine and Rehabilitation

## 2022-06-29 ENCOUNTER — Other Ambulatory Visit: Payer: Self-pay | Admitting: Physician Assistant

## 2022-06-29 DIAGNOSIS — M159 Polyosteoarthritis, unspecified: Secondary | ICD-10-CM

## 2022-06-29 NOTE — Procedures (Signed)
Cervical Epidural Steroid Injection - Interlaminar Approach with Fluoroscopic Guidance  Patient: Harold Wilson.      Date of Birth: 02/14/1946 MRN: [id number] PCP: [name], NP      Visit Date: 06/19/2022   Universal Protocol:    Date/Time: 06/28/2408:31 AM  Consent Given By: the patient  Position: PRONE  Additional Comments: Vital signs were monitored before and after the procedure. Patient was prepped and draped in the usual sterile fashion. The correct patient, procedure, and site was verified.   Injection Procedure Details:   Procedure diagnoses: Cervical radiculopathy [M54.12]    Meds Administered:  Meds ordered this encounter  Medications   methylPREDNISolone acetate (DEPO-MEDROL) injection 80 mg     Laterality: Left  Location/Site: C7-T1  Needle: 3.5 in., 20 ga. Tuohy  Needle Placement: Paramedian epidural space  Findings:  -Comments: Excellent flow of contrast into the epidural space.  Procedure Details: Using a paramedian approach from the side mentioned above, the region overlying the inferior lamina was localized under fluoroscopic visualization and the soft tissues overlying this structure were infiltrated with 4 ml. of 1% Lidocaine without Epinephrine. A # 20 gauge, Tuohy needle was inserted into the epidural space using a paramedian approach.  The epidural space was localized using loss of resistance along with contralateral oblique bi-planar fluoroscopic views.  After negative aspirate for air, blood, and CSF, a 2 ml. volume of Isovue-250 was injected into the epidural space and the flow of contrast was observed. Radiographs were obtained for documentation purposes.   The injectate was administered into the level noted above.  Additional Comments:  No complications occurred Dressing: 2 x 2 sterile gauze and Band-Aid    Post-procedure details: Patient was observed during the procedure. Post-procedure instructions were  reviewed.  Patient left the clinic in stable condition.

## 2022-06-29 NOTE — Progress Notes (Signed)
Harold Wilson. - 77 y.o. male MRN [id number]  Date of birth: 02/06/1946  Office Visit Note: Visit Date: 06/19/2022 PCP: [name], NP Referred by: [name], NP  Subjective: Chief Complaint  Patient presents with   Neck - Pain   HPI:  Harold Azer. is a 77 y.o. male who comes in today for planned repeat Left C7-T1  Cervical Interlaminar epidural steroid injection with fluoroscopic guidance.  The patient has failed conservative care including home exercise, medications, time and activity modification.  This injection will be diagnostic and hopefully therapeutic.  Please see requesting physician notes for further details and justification. Patient received more than 50% pain relief from prior injection.   Referring: Dr. Glee Arvin   ROS Otherwise per HPI.  Assessment & Plan: Visit Diagnoses:    ICD-10-CM   1. Cervical radiculopathy  M54.12 XR C-ARM NO REPORT    Epidural Steroid injection    methylPREDNISolone acetate (DEPO-MEDROL) injection 80 mg      Plan: No additional findings.   Meds & Orders:  Meds ordered this encounter  Medications   methylPREDNISolone acetate (DEPO-MEDROL) injection 80 mg    Orders Placed This Encounter  Procedures   XR C-ARM NO REPORT   Epidural Steroid injection    Follow-up: Return for visit to requesting provider as needed.   Procedures: No procedures performed  Cervical Epidural Steroid Injection - Interlaminar Approach with Fluoroscopic Guidance  Patient: Harold Wilson.      Date of Birth: [birth year] MRN: [id number] PCP: [name], NP      Visit Date: 06/19/2022   Universal Protocol:    Date/Time: 06/28/2408:31 AM  Consent Given By: the patient  Position: PRONE  Additional Comments: Vital signs were monitored before and after the procedure. Patient was prepped and draped in the usual sterile fashion. The correct patient, procedure, and site was verified.   Injection Procedure Details:    Procedure diagnoses: Cervical radiculopathy [M54.12]    Meds Administered:  Meds ordered this encounter  Medications   methylPREDNISolone acetate (DEPO-MEDROL) injection 80 mg     Laterality: Left  Location/Site: C7-T1  Needle: 3.5 in., 20 ga. Tuohy  Needle Placement: Paramedian epidural space  Findings:  -Comments: Excellent flow of contrast into the epidural space.  Procedure Details: Using a paramedian approach from the side mentioned above, the region overlying the inferior lamina was localized under fluoroscopic visualization and the soft tissues overlying this structure were infiltrated with 4 ml. of 1% Lidocaine without Epinephrine. A # 20 gauge, Tuohy needle was inserted into the epidural space using a paramedian approach.  The epidural space was localized using loss of resistance along with contralateral oblique bi-planar fluoroscopic views.  After negative aspirate for air, blood, and CSF, a 2 ml. volume of Isovue-250 was injected into the epidural space and the flow of contrast was observed. Radiographs were obtained for documentation purposes.   The injectate was administered into the level noted above.  Additional Comments:  No complications occurred Dressing: 2 x 2 sterile gauze and Band-Aid    Post-procedure details: Patient was observed during the procedure. Post-procedure instructions were reviewed.  Patient left the clinic in stable condition.   Clinical History: MRI CERVICAL SPINE WITHOUT CONTRAST   TECHNIQUE: Multiplanar, multisequence MR imaging of the cervical spine was performed. No intravenous contrast was administered.   COMPARISON:  Cervical radiographs June 20, 2020.   FINDINGS: Alignment: Similar mild anterolisthesis of C2 on C3 and mild retrolisthesis of C3  on C4.   Vertebrae: C4-C6 ACDF. No focal marrow edema to suggest acute fracture or discitis/osteomyelitis. No suspicious bone lesions.   Cord: Normal cord signal.  Stroke    Posterior Fossa, vertebral arteries, paraspinal tissues: Visualized vertebral artery flow voids are maintained. No evidence of acute abnormality in the visualized inferior posterior fossa. No paraspinal edema.   Disc levels:   C2-C3: Left facet arthropathy with mild left foraminal stenosis. Posterior disc osteophyte complex and ligamentum flavum thickening with mild to moderate canal stenosis.   C3-C4: Posterior disc osteophyte complex with bilateral facet and uncovertebral hypertrophy. Resulting moderate to severe bilateral foraminal stenosis. Mild canal stenosis.   C4-C5: ACDF. Left greater than right facet uncovertebral hypertrophy. Resulting moderate left and mild right foraminal stenosis. Mild canal stenosis.   C5-C6: ACDF endplate spurring with left greater than right facet and uncovertebral hypertrophy. Resulting moderate left foraminal stenosis. Mild canal stenosis.   C6-C7: Posterior disc osteophyte complex with bilateral facet and uncovertebral hypertrophy. Resulting moderate to severe left and moderate right foraminal stenosis. Mild to moderate canal stenosis.   C7-T1: Posterior disc osteophyte complex with bilateral facet and uncovertebral hypertrophy. Resulting moderate left and mild to moderate right foraminal stenosis. Patent canal.   IMPRESSION: 1. At C3-C4, moderate to severe right greater than left foraminal stenosis with mild canal stenosis. 2. At C6-C7, moderate to severe left and moderate right foraminal stenosis with mild to moderate canal stenosis. 3. At C7-T1, moderate left and mild right foraminal stenosis. 4. At C4-C5 and C5-C6, ACDF with mild canal stenosis and moderate left foraminal stenosis. 5. At C2-C3, mild to moderate canal stenosis and mild left foraminal stenosis.     Electronically Signed   By: Feliberto Harts M.D.   On: 05/31/2021 11:48     Objective:  VS:  HT:    WT:   BMI:     BP:119/69  HR:82bpm  TEMP: ( )  RESP:   Physical Exam Vitals and nursing note reviewed.  Constitutional:      General: He is not in acute distress.    Appearance: Normal appearance. He is not ill-appearing.  HENT:     Head: Normocephalic and atraumatic.     Right Ear: External ear normal.     Left Ear: External ear normal.  Eyes:     Extraocular Movements: Extraocular movements intact.  Cardiovascular:     Rate and Rhythm: Normal rate.     Pulses: Normal pulses.  Abdominal:     General: There is no distension.     Palpations: Abdomen is soft.  Musculoskeletal:        General: No signs of injury.     Cervical back: Neck supple. Tenderness present. No rigidity.     Right lower leg: No edema.     Left lower leg: No edema.     Comments: Patient has good strength in the upper extremities with 5 out of 5 strength in wrist extension long finger flexion APB.  No intrinsic hand muscle atrophy.  Negative Hoffmann's test.  Lymphadenopathy:     Cervical: No cervical adenopathy.  Skin:    Findings: No erythema or rash.  Neurological:     General: No focal deficit present.     Mental Status: He is alert and oriented to person, place, and time.     Sensory: No sensory deficit.     Motor: No weakness or abnormal muscle tone.     Coordination: Coordination normal.  Psychiatric:  Mood and Affect: Mood normal.        Behavior: Behavior normal.      Imaging: No results found.

## 2022-07-02 ENCOUNTER — Other Ambulatory Visit: Payer: Self-pay | Admitting: Adult Health

## 2022-07-02 DIAGNOSIS — F5101 Primary insomnia: Secondary | ICD-10-CM

## 2022-07-02 MED ORDER — ZOLPIDEM TARTRATE 10 MG PO TABS: 10.0000 mg | ORAL_TABLET | Freq: Every evening | ORAL | 2 refills | Status: DC | PRN

## 2022-07-03 ENCOUNTER — Ambulatory Visit (INDEPENDENT_AMBULATORY_CARE_PROVIDER_SITE_OTHER): Payer: PPO | Admitting: Adult Health

## 2022-07-03 ENCOUNTER — Encounter: Payer: Self-pay | Admitting: Adult Health

## 2022-07-03 VITALS — BP 84/62 | HR 92 | Temp 98.6°F | Ht 68.75 in | Wt 176.0 lb

## 2022-07-03 DIAGNOSIS — R251 Tremor, unspecified: Secondary | ICD-10-CM | POA: Diagnosis not present

## 2022-07-03 DIAGNOSIS — R4189 Other symptoms and signs involving cognitive functions and awareness: Secondary | ICD-10-CM

## 2022-07-03 DIAGNOSIS — F1010 Alcohol abuse, uncomplicated: Secondary | ICD-10-CM | POA: Diagnosis not present

## 2022-07-03 NOTE — Patient Instructions (Addendum)
I am going to check some blood work on you today   I want you you to cut the norvasc in half and take 5 mg   Follow up in about three weeks

## 2022-07-03 NOTE — Progress Notes (Unsigned)
Subjective:    Patient ID: Harold Wilson., male    DOB: 25-Oct-1945, 77 y.o.   MRN: [id number]  HPI 77 year old male who  has a past medical history of BENIGN PROSTATIC HYPERTROPHY (02/06/2007), COLONIC POLYPS, HX OF (02/06/2007), COVID (09/18/2020), DIVERTICULOSIS, COLON (02/06/2007), ERECTILE DYSFUNCTION, NON-ORGANIC, MILD (02/23/2009), HYPERLIPIDEMIA (02/22/2008), Hypertension, and OSTEOARTHRITIS (02/06/2007).  He presents to the office today for multiple issues.  First issue is that of concern for cognitive impairment.  His wife feels as though he has a very poor memory and cannot remember things that he talked about 5 minutes prior.  Patient denies that his memory is poor.  He has not had any trouble with driving, or misplacing things around the house.  He does report that he will forget some things but overall he feels as though his memory is stable.  His concern is that he fell for a financial scam and wished he lost multiple thousands of dollars.  Chalks this up to "just not thinking straight"  He also reports that he has a tremor in his left hand that is only present intermittently and only when using the left hand in such instances as writing or feeding himself.  He states "I do not even recognize my handwriting any longer".  He does get epidural injections into the cervical spine for cervical radiculopathy in his left arm.  Does report that although he has these injections he continues to have numbness and tingling in his left hand.  There is concern that he over indulges in alcohol, he reports that he has cut back to maybe only having 2-3 drinks throughout the day.   Review of Systems See HPI   Past Medical History:  Diagnosis Date   BENIGN PROSTATIC HYPERTROPHY 02/06/2007   COLONIC POLYPS, HX OF 02/06/2007   COVID 09/18/2020   per pt   DIVERTICULOSIS, COLON 02/06/2007   ERECTILE DYSFUNCTION, NON-ORGANIC, MILD 02/23/2009   HYPERLIPIDEMIA 02/22/2008   Hypertension     OSTEOARTHRITIS 02/06/2007    Social History   Socioeconomic History   Marital status: Married    Spouse name: Not on file   Number of children: 5   Years of education: Not on file   Highest education level: GED or equivalent  Occupational History   Occupation: retired  Tobacco Use   Smoking status: Former   Smokeless tobacco: Never  Building services engineer Use: Never used  Substance and Sexual Activity   Alcohol use: Yes    Alcohol/week: 1.0 standard drink of alcohol    Types: 1 Cans of beer per week   Drug use: No   Sexual activity: Not on file  Other Topics Concern   Not on file  Social History Narrative   Retired    Married    Social Determinants of Health   Financial Resource Strain: Low Risk  (10/16/2021)   Overall Financial Resource Strain (CARDIA)    Difficulty of Paying Living Expenses: Not hard at all  Food Insecurity: No Food Insecurity (10/16/2021)   Hunger Vital Sign    Worried About Running Out of Food in the Last Year: Never true    Ran Out of Food in the Last Year: Never true  Transportation Needs: No Transportation Needs (06/29/2022)   PRAPARE - Administrator, Civil Service (Medical): No    Lack of Transportation (Non-Medical): No  Physical Activity: Inactive (10/16/2021)   Exercise Vital Sign    Days of Exercise per Week:  0 days    Minutes of Exercise per Session: 0 min  Stress: No Stress Concern Present (10/16/2021)   [employer] of Occupational Health - Occupational Stress Questionnaire    Feeling of Stress : Not at all  Social Connections: Unknown (06/29/2022)   Social Connection and Isolation Panel [NHANES]    Frequency of Communication with Friends and Family: Not on file    Frequency of Social Gatherings with Friends and Family: Not on file    Attends Religious Services: Not on file    Active Member of Clubs or Organizations: No    Attends Banker Meetings: Not on file    Marital Status: Married  Intimate Partner  Violence: Not At Risk (10/10/2020)   Humiliation, Afraid, Rape, and Kick questionnaire    Fear of Current or Ex-Partner: No    Emotionally Abused: No    Physically Abused: No    Sexually Abused: No    Past Surgical History:  Procedure Laterality Date   CERVICAL SPINE SURGERY     ROTATOR CUFF REPAIR     TEMPOROMANDIBULAR JOINT SURGERY     TONSILLECTOMY     VASECTOMY      Family History  Problem Relation Age of Onset   COPD Mother    Heart disease Mother    Heart failure Mother    Cancer Father        lung ca    Allergies  Allergen Reactions   Nickel Rash   Sulfacetamide Sodium     Dryness of mouth     Current Outpatient Medications on File Prior to Visit  Medication Sig Dispense Refill   amLODipine (NORVASC) 10 MG tablet TAKE ONE TABLET BY MOUTH DAILY 90 tablet 3   brimonidine (ALPHAGAN) 0.2 % ophthalmic solution      cyclobenzaprine (FLEXERIL) 5 MG tablet TAKE ONE TABLET BY MOUTH THREE TIMES A DAY AS NEEDED FOR MUSCLE SPASMS 30 tablet 0   diclofenac (VOLTAREN) 75 MG EC tablet Take 1 tablet (75 mg total) by mouth 2 (two) times daily as needed. 60 tablet 2   FLUoxetine (PROZAC) 40 MG capsule Take 1 capsule (40 mg total) by mouth daily. 90 capsule 1   fluticasone (FLONASE) 50 MCG/ACT nasal spray Place 2 sprays into both nostrils daily. 48 g 0   gabapentin (NEURONTIN) 100 MG capsule Take 1 capsule (100 mg total) by mouth 3 (three) times daily as needed. 30 capsule 2   lisinopril (ZESTRIL) 40 MG tablet Take 1 tablet (40 mg total) by mouth daily. 90 tablet 3   methocarbamol (ROBAXIN-750) 750 MG tablet Take 1 tablet (750 mg total) by mouth 2 (two) times daily as needed for muscle spasms. 20 tablet 1   naproxen (NAPROXEN DR) 500 MG EC tablet Take 1 tablet (500 mg total) by mouth 2 (two) times daily with a meal. 180 tablet 1   tadalafil (CIALIS) 5 MG tablet TAKE 1 TABLET BY MOUTH DAILY 30 tablet 0   tamsulosin (FLOMAX) 0.4 MG CAPS capsule Take 1 capsule (0.4 mg total) by mouth  daily. 90 capsule 1   traMADol (ULTRAM) 50 MG tablet TAKE 1 TO 2 TABLETS BY MOUTH DAILY AS NEEDED 20 tablet 2   zolpidem (AMBIEN) 10 MG tablet Take 1 tablet (10 mg total) by mouth at bedtime as needed. 30 tablet 2   No current facility-administered medications on file prior to visit.    BP (!) 84/62   Pulse 92   Temp 98.6 F (37 C) (Oral)  Ht 5' 8.75" (1.746 m)   Wt 176 lb (79.8 kg)   SpO2 94%   BMI 26.18 kg/m       Objective:   Physical Exam Vitals and nursing note reviewed.  Constitutional:      Appearance: Normal appearance.  Cardiovascular:     Rate and Rhythm: Normal rate and regular rhythm.     Pulses: Normal pulses.     Heart sounds: Normal heart sounds.  Pulmonary:     Effort: Pulmonary effort is normal.     Breath sounds: Normal breath sounds.  Musculoskeletal:        General: Normal range of motion.  Skin:    General: Skin is warm and dry.     Capillary Refill: Capillary refill takes less than 2 seconds.  Neurological:     General: No focal deficit present.     Mental Status: He is oriented to person, place, and time.     Cranial Nerves: Cranial nerves 2-12 are intact.     Sensory: Sensation is intact.     Motor: Tremor present.     Coordination: Coordination is intact.     Gait: Gait is intact.     Comments:  mild left upper extremity tremor when holding a pen   Psychiatric:        Mood and Affect: Mood normal.        Behavior: Behavior normal.        Thought Content: Thought content normal.        Judgment: Judgment normal.         Assessment & Plan:  1. Cognitive impairment - Alert and oriented in the office.     07/03/2022    4:17 PM 06/28/2021   12:05 PM  MMSE - Mini Mental State Exam  Orientation to time 5 5  Orientation to Place 5 5  Registration 3 3  Attention/ Calculation 5 5  Recall 2 3  Language- name 2 objects 2 2  Language- repeat 1 1  Language- follow 3 step command 3 3  Language- read & follow direction 1 1  Write a  sentence 1 1  Copy design 1 1  Total score 29 30  - Will check labs. Can consider MRI of head in the future.  - CBC; Future - Basic Metabolic Panel; Future - Ethanol; Future - Vitamin B12; Future - Folate; Future - Folate - Vitamin B12 - Ethanol - Basic Metabolic Panel - CBC  2. Tremor - consider referral to neurology  - CBC; Future - Basic Metabolic Panel; Future - Ethanol; Future - Vitamin B12; Future - Folate; Future - Folate - Vitamin B12 - Ethanol - Basic Metabolic Panel - CBC  3. Alcohol abuse - Encouraged to refrain from alcohol completely  - CBC; Future - Basic Metabolic Panel; Future - Ethanol; Future - Vitamin B12; Future - Folate; Future - Folate - Vitamin B12 - Ethanol - Basic Metabolic Panel - CBC  [name], NP

## 2022-07-04 LAB — BASIC METABOLIC PANEL
BUN: 31 mg/dL — ABNORMAL HIGH (ref 6–23)
CO2: 23 mEq/L (ref 19–32)
Calcium: 8.7 mg/dL (ref 8.4–10.5)
Chloride: 107 mEq/L (ref 96–112)
Creatinine, Ser: 1.35 mg/dL (ref 0.40–1.50)
GFR: 51.78 mL/min — ABNORMAL LOW (ref >60.00)
Glucose, Bld: 111 mg/dL — ABNORMAL HIGH (ref 70–99)
Potassium: 4.1 mEq/L (ref 3.5–5.1)
Sodium: 137 mEq/L (ref 135–145)

## 2022-07-04 LAB — CBC
HCT: 39.1 % (ref 39.0–52.0)
Hemoglobin: 13.2 g/dL (ref 13.0–17.0)
MCHC: 33.9 g/dL (ref 30.0–36.0)
MCV: 92.6 fl (ref 78.0–100.0)
Platelets: 181 10*3/uL (ref 150.0–400.0)
RBC: 4.22 Mil/uL (ref 4.22–5.81)
RDW: 14.3 % (ref 11.5–15.5)
WBC: 5.6 10*3/uL (ref 4.0–10.5)

## 2022-07-04 LAB — VITAMIN B12: Vitamin B-12: 156 pg/mL — ABNORMAL LOW (ref 211–911)

## 2022-07-04 LAB — FOLATE: Folate: 15.6 ng/mL (ref >5.9)

## 2022-07-05 ENCOUNTER — Other Ambulatory Visit: Payer: Self-pay | Admitting: Adult Health

## 2022-07-05 LAB — ETHANOL

## 2022-07-05 MED ORDER — NAPROXEN 500 MG PO TBEC: 500.0000 mg | DELAYED_RELEASE_TABLET | Freq: Two times a day (BID) | ORAL | 0 refills | Status: DC

## 2022-07-09 NOTE — Telephone Encounter (Signed)
Spoke to pharmacist and she stated insurance will not cover this Rx bc it has a coating on it. Also, the medication is OTC. I advised pt to get Aleve 240 mg OTC and take 2 tabs per Cory's annotation. Pt verbalized understanding.

## 2022-07-10 ENCOUNTER — Telehealth: Payer: Self-pay | Admitting: Adult Health

## 2022-07-10 NOTE — Telephone Encounter (Signed)
Called patient to schedule Medicare Annual Wellness Visit (AWV). Left message for patient to call back and schedule Medicare Annual Wellness Visit (AWV).  Last date of AWV: 10/16/21   can be schedule early  calendar year HTA  Please schedule an appointment at any time with NHA beverly or hannah kim.  If any questions, please contact me at [phone].  Thank you ,  Rudell Cobb AWV direct phone # [phone]

## 2022-07-19 ENCOUNTER — Encounter: Payer: Self-pay | Admitting: Adult Health

## 2022-07-19 DIAGNOSIS — M159 Polyosteoarthritis, unspecified: Secondary | ICD-10-CM

## 2022-07-19 MED ORDER — CYCLOBENZAPRINE HCL 5 MG PO TABS
ORAL_TABLET | ORAL | 0 refills | Status: DC
Start: 2022-07-19 — End: 2022-10-09

## 2022-07-19 NOTE — Telephone Encounter (Signed)
Please advise 

## 2022-07-25 ENCOUNTER — Telehealth: Payer: Self-pay

## 2022-07-25 ENCOUNTER — Ambulatory Visit: Payer: PPO | Admitting: Adult Health

## 2022-07-25 NOTE — Progress Notes (Signed)
Care Management & Coordination Services Pharmacy Team  Reason for Encounter: Hypertension  Contacted patient to discuss hypertension disease state. Unsuccessful outreach. Left voicemail for patient to return call.    Current antihypertensive regimen:  Amlodipine 10 mg daily Lisinopril 40 mg daily Patient verbally confirms he is taking the above medications as directed.   How often are you checking your Blood Pressure?   he checks his blood pressure   taking his medication.  Current home BP readings:  DATE:             BP               PULSE   Any readings above 180/100?   What recent interventions/DTPs have been made by any provider to improve Blood Pressure control since last CPP Visit: No recent interventions  Any recent hospitalizations or ED visits since last visit with CPP? No recent hospital visits.   What diet changes have been made to improve Blood Pressure Control?  Patient Breakfast -  Lunch -  Dinner -  Caffeine -  Salt -   What exercise is being done to improve your Blood Pressure Control?    Adherence Review: Is the patient currently on ACE/ARB medication? Yes Does the patient have >5 day gap between last estimated fill dates? No  Care Gaps: AWV - completed 10/16/2021 Last BP - 84/62 on 07/03/2022 Tdap - overdue Covid - overdue   Star Rating Drugs: Lisinopril 40 mg - last filled 06/29/2022 90 DS at [facility]   Chart Updates: Recent office visits:  07/03/2022 [name] NP - Patient was seen for cognitive impairment and additional concerns. No medication changes.   Recent consult visits:  06/05/2022 [name] MD (ortho) - Patient was seen for cervical radiculopathy. Started Gabapentin 100mg  tid prn, Methocarbamol 750mg  bid prn and Prednisone 5 mg as directed.   Hospital visits:  None  Medications: Outpatient Encounter Medications as of 07/25/2022  Medication Sig   amLODipine (NORVASC) 10 MG tablet TAKE ONE TABLET BY MOUTH DAILY    brimonidine (ALPHAGAN) 0.2 % ophthalmic solution    cyclobenzaprine (FLEXERIL) 5 MG tablet TAKE ONE TABLET BY MOUTH THREE TIMES A DAY AS NEEDED FOR MUSCLE SPASMS   diclofenac (VOLTAREN) 75 MG EC tablet Take 1 tablet (75 mg total) by mouth 2 (two) times daily as needed.   FLUoxetine (PROZAC) 40 MG capsule Take 1 capsule (40 mg total) by mouth daily.   fluticasone (FLONASE) 50 MCG/ACT nasal spray Place 2 sprays into both nostrils daily.   gabapentin (NEURONTIN) 100 MG capsule Take 1 capsule (100 mg total) by mouth 3 (three) times daily as needed.   lisinopril (ZESTRIL) 40 MG tablet Take 1 tablet (40 mg total) by mouth daily.   methocarbamol (ROBAXIN-750) 750 MG tablet Take 1 tablet (750 mg total) by mouth 2 (two) times daily as needed for muscle spasms.   naproxen (EC NAPROSYN) 500 MG EC tablet Take 1 tablet (500 mg total) by mouth 2 (two) times daily with a meal.   tadalafil (CIALIS) 5 MG tablet TAKE 1 TABLET BY MOUTH DAILY   tamsulosin (FLOMAX) 0.4 MG CAPS capsule Take 1 capsule (0.4 mg total) by mouth daily.   traMADol (ULTRAM) 50 MG tablet TAKE 1 TO 2 TABLETS BY MOUTH DAILY AS NEEDED   zolpidem (AMBIEN) 10 MG tablet Take 1 tablet (10 mg total) by mouth at bedtime as needed.   No facility-administered encounter medications on file as of 07/25/2022.  Fill History:  Dispensed Days Supply Quantity Provider Pharmacy  AMLODIPINE 10 MG TABLET 05/14/2022 90 90 tablet      Dispensed Days Supply Quantity Provider Pharmacy  BRIMONIDINE 0.2 % DROPS 05/29/2022 66 10 mL      Dispensed Days Supply Quantity Provider Pharmacy  CYCLOBENZAPRINE 5 MG TABLET 07/19/2022 10 30 tablet      Dispensed Days Supply Quantity Provider Pharmacy  DICLOFENAC SODIUM 75 MG TABLET, DELAYED RELEASE (ENTERIC COATED) 06/11/2022 30 60 tablet      Dispensed Days Supply Quantity Provider Pharmacy  FLUOXETINE 40 MG CAPSULE 06/16/2022 90 90 capsule      Dispensed Days Supply Quantity Provider Pharmacy  FLUTICASONE PROPIONATE  50 MCG/ACTUATION SPRAY, SUSPENSION 11/02/2021 90 48 mL      Dispensed Days Supply Quantity Provider Pharmacy  GABAPENTIN 100 MG CAPSULE 06/29/2022 10 30 capsule      Dispensed Days Supply Quantity Provider Pharmacy  LISINOPRIL 40 MG TABLET 06/29/2022 90 90 tablet      Dispensed Days Supply Quantity Provider Pharmacy  METHOCARBAMOL 750 MG TABLET 06/29/2022 10 20 tablet      Dispensed Days Supply Quantity Provider Pharmacy  NAPROXEN 500 MG TABLET, DELAYED RELEASE (ENTERIC COATED) 03/01/2021 90 180 tablet      Dispensed Days Supply Quantity Provider Pharmacy  TAMSULOSIN 0.4 MG CAPSULE 01/10/2022 90 90 capsule      Dispensed Days Supply Quantity Provider Pharmacy  ZOLPIDEM 10 MG TABLET 07/09/2022 30 30 tablet     Recent Office Vitals: BP Readings from Last 3 Encounters:  07/03/22 (!) 84/62  06/19/22 119/69  10/25/21 130/80   Pulse Readings from Last 3 Encounters:  07/03/22 92  06/19/22 82  10/25/21 (!) 102    Wt Readings from Last 3 Encounters:  07/03/22 176 lb (79.8 kg)  10/25/21 183 lb (83 kg)  10/16/21 195 lb (88.5 kg)     Kidney Function Lab Results  Component Value Date/Time   CREATININE 1.35 07/03/2022 04:30 PM   CREATININE 1.43 10/25/2021 10:56 AM   CREATININE 1.29 (H) 10/05/2019 09:54 AM   CREATININE 1.06 03/13/2018 03:49 PM   GFR 51.78 (L) 07/03/2022 04:30 PM   GFRNONAA 55 (L) 10/05/2019 09:54 AM   GFRAA 64 10/05/2019 09:54 AM       Latest Ref Rng & Units 07/03/2022    4:30 PM 10/25/2021   10:56 AM 06/28/2021   11:44 AM  BMP  Glucose 70 - 99 mg/dL 161  89  98   BUN 6 - 23 mg/dL 31  22  21    Creatinine 0.40 - 1.50 mg/dL 0.96  0.45  4.09   Sodium 135 - 145 mEq/L 137  139  140   Potassium 3.5 - 5.1 mEq/L 4.1  4.1  4.3   Chloride 96 - 112 mEq/L 107  105  107   CO2 19 - 32 mEq/L 23  26  27    Calcium 8.4 - 10.5 mg/dL 8.7  8.7  8.6    [facility]  Clinical Pharmacist Assistant [phone]

## 2022-07-26 ENCOUNTER — Ambulatory Visit (INDEPENDENT_AMBULATORY_CARE_PROVIDER_SITE_OTHER): Payer: PPO

## 2022-07-26 ENCOUNTER — Encounter: Payer: Self-pay | Admitting: Adult Health

## 2022-07-26 ENCOUNTER — Ambulatory Visit (INDEPENDENT_AMBULATORY_CARE_PROVIDER_SITE_OTHER): Payer: PPO | Admitting: Adult Health

## 2022-07-26 VITALS — BP 110/78 | HR 87 | Temp 98.1°F | Wt 179.0 lb

## 2022-07-26 DIAGNOSIS — R053 Chronic cough: Secondary | ICD-10-CM | POA: Diagnosis not present

## 2022-07-26 DIAGNOSIS — M25512 Pain in left shoulder: Secondary | ICD-10-CM | POA: Diagnosis not present

## 2022-07-26 MED ORDER — METHYLPREDNISOLONE 4 MG PO TBPK
ORAL_TABLET | ORAL | 0 refills | Status: DC
Start: 2022-07-26 — End: 2022-10-09

## 2022-07-26 NOTE — Progress Notes (Signed)
Subjective:    Patient ID: Harold Wilson., male    DOB: Jan 10, 1946, 77 y.o.   MRN: [id number]  HPI 77 year old male who  has a past medical history of BENIGN PROSTATIC HYPERTROPHY (02/06/2007), COLONIC POLYPS, HX OF (02/06/2007), COVID (09/18/2020), DIVERTICULOSIS, COLON (02/06/2007), ERECTILE DYSFUNCTION, NON-ORGANIC, MILD (02/23/2009), HYPERLIPIDEMIA (02/22/2008), Hypertension, and OSTEOARTHRITIS (02/06/2007).  He presents to the office today for the complaint of let shoulder pain that he has had for a week or two. Believes he injured his shoulder picking up and playing with his grandkids. Has pain with with raising arm . No loss of strength   He also reports a " bad cough" that he has had for quiet awhile. Sometimes it is productive. He smoked but quit about 40 years ago.He is worried he may have lung cancer. Denies fevers, chills, weight loss or feeling ill.   Review of Systems See HPI  Past Medical History:  Diagnosis Date   BENIGN PROSTATIC HYPERTROPHY 02/06/2007   COLONIC POLYPS, HX OF 02/06/2007   COVID 09/18/2020   per pt   DIVERTICULOSIS, COLON 02/06/2007   ERECTILE DYSFUNCTION, NON-ORGANIC, MILD 02/23/2009   HYPERLIPIDEMIA 02/22/2008   Hypertension    OSTEOARTHRITIS 02/06/2007    Social History   Socioeconomic History   Marital status: Married    Spouse name: Not on file   Number of children: 5   Years of education: Not on file   Highest education level: GED or equivalent  Occupational History   Occupation: retired  Tobacco Use   Smoking status: Former   Smokeless tobacco: Never  Building services engineer Use: Never used  Substance and Sexual Activity   Alcohol use: Yes    Alcohol/week: 1.0 standard drink of alcohol    Types: 1 Cans of beer per week   Drug use: No   Sexual activity: Not on file  Other Topics Concern   Not on file  Social History Narrative   Retired    Married    Social Determinants of Health   Financial Resource Strain: Low Risk   (10/16/2021)   Overall Financial Resource Strain (CARDIA)    Difficulty of Paying Living Expenses: Not hard at all  Food Insecurity: No Food Insecurity (10/16/2021)   Hunger Vital Sign    Worried About Running Out of Food in the Last Year: Never true    Ran Out of Food in the Last Year: Never true  Transportation Needs: No Transportation Needs (06/29/2022)   PRAPARE - Administrator, Civil Service (Medical): No    Lack of Transportation (Non-Medical): No  Physical Activity: Inactive (10/16/2021)   Exercise Vital Sign    Days of Exercise per Week: 0 days    Minutes of Exercise per Session: 0 min  Stress: No Stress Concern Present (10/16/2021)   [employer] of Occupational Health - Occupational Stress Questionnaire    Feeling of Stress : Not at all  Social Connections: Unknown (06/29/2022)   Social Connection and Isolation Panel [NHANES]    Frequency of Communication with Friends and Family: Not on file    Frequency of Social Gatherings with Friends and Family: Not on file    Attends Religious Services: Not on file    Active Member of Clubs or Organizations: No    Attends Banker Meetings: Not on file    Marital Status: Married  Intimate Partner Violence: Not At Risk (10/10/2020)   Humiliation, Afraid, Rape, and Kick questionnaire  Fear of Current or Ex-Partner: No    Emotionally Abused: No    Physically Abused: No    Sexually Abused: No    Past Surgical History:  Procedure Laterality Date   CERVICAL SPINE SURGERY     ROTATOR CUFF REPAIR     TEMPOROMANDIBULAR JOINT SURGERY     TONSILLECTOMY     VASECTOMY      Family History  Problem Relation Age of Onset   COPD Mother    Heart disease Mother    Heart failure Mother    Cancer Father        lung ca    Allergies  Allergen Reactions   Nickel Rash   Sulfacetamide Sodium     Dryness of mouth     Current Outpatient Medications on File Prior to Visit  Medication Sig Dispense Refill    amLODipine (NORVASC) 10 MG tablet TAKE ONE TABLET BY MOUTH DAILY 90 tablet 3   brimonidine (ALPHAGAN) 0.2 % ophthalmic solution      cyclobenzaprine (FLEXERIL) 5 MG tablet TAKE ONE TABLET BY MOUTH THREE TIMES A DAY AS NEEDED FOR MUSCLE SPASMS 30 tablet 0   diclofenac (VOLTAREN) 75 MG EC tablet Take 1 tablet (75 mg total) by mouth 2 (two) times daily as needed. 60 tablet 2   FLUoxetine (PROZAC) 40 MG capsule Take 1 capsule (40 mg total) by mouth daily. 90 capsule 1   fluticasone (FLONASE) 50 MCG/ACT nasal spray Place 2 sprays into both nostrils daily. 48 g 0   gabapentin (NEURONTIN) 100 MG capsule Take 1 capsule (100 mg total) by mouth 3 (three) times daily as needed. 30 capsule 2   lisinopril (ZESTRIL) 40 MG tablet Take 1 tablet (40 mg total) by mouth daily. 90 tablet 3   methocarbamol (ROBAXIN-750) 750 MG tablet Take 1 tablet (750 mg total) by mouth 2 (two) times daily as needed for muscle spasms. 20 tablet 1   naproxen (EC NAPROSYN) 500 MG EC tablet Take 1 tablet (500 mg total) by mouth 2 (two) times daily with a meal. 180 tablet 0   tadalafil (CIALIS) 5 MG tablet TAKE 1 TABLET BY MOUTH DAILY 30 tablet 0   tamsulosin (FLOMAX) 0.4 MG CAPS capsule Take 1 capsule (0.4 mg total) by mouth daily. 90 capsule 1   traMADol (ULTRAM) 50 MG tablet TAKE 1 TO 2 TABLETS BY MOUTH DAILY AS NEEDED 20 tablet 2   zolpidem (AMBIEN) 10 MG tablet Take 1 tablet (10 mg total) by mouth at bedtime as needed. 30 tablet 2   No current facility-administered medications on file prior to visit.    BP 110/78 (BP Location: Left Arm, Patient Position: Sitting, Cuff Size: Normal)   Pulse 87   Temp 98.1 F (36.7 C) (Oral)   Wt 179 lb (81.2 kg)   SpO2 98%   BMI 26.63 kg/m       Objective:   Physical Exam Vitals and nursing note reviewed.  Constitutional:      Appearance: Normal appearance.  Cardiovascular:     Rate and Rhythm: Normal rate and regular rhythm.     Pulses: Normal pulses.     Heart sounds: Normal heart  sounds.  Pulmonary:     Effort: Pulmonary effort is normal.     Breath sounds: Normal breath sounds.  Musculoskeletal:        General: Tenderness present. Normal range of motion.     Left shoulder: Tenderness (deltoid) present. No swelling, deformity, bony tenderness or crepitus. Normal range of  motion. Normal strength.  Skin:    General: Skin is warm and dry.     Capillary Refill: Capillary refill takes less than 2 seconds.  Neurological:     General: No focal deficit present.     Mental Status: He is alert and oriented to person, place, and time.  Psychiatric:        Mood and Affect: Mood normal.        Behavior: Behavior normal.        Thought Content: Thought content normal.        Judgment: Judgment normal.         Assessment & Plan:  1. Acute pain of left shoulder - Seems to be muscular in origin. Full ROM without difficulty makes this less likely rotator cuf finjury.  - methylPREDNISolone (MEDROL DOSEPAK) 4 MG TBPK tablet; Take as directed  Dispense: 21 tablet; Refill: 0  2. Chronic cough - Will get xray today. Lungs clear on exam. Possible allergy mediated  - DG Chest 2 View; Future  [name], NP   Time spent with patient today was 32 minutes which consisted of chart review, discussing diagnosis, work up, treatment answering questions and documentation.

## 2022-10-02 ENCOUNTER — Encounter (INDEPENDENT_AMBULATORY_CARE_PROVIDER_SITE_OTHER): Payer: Self-pay

## 2022-10-05 ENCOUNTER — Encounter: Payer: Self-pay | Admitting: Adult Health

## 2022-10-05 ENCOUNTER — Other Ambulatory Visit: Payer: Self-pay | Admitting: Adult Health

## 2022-10-05 DIAGNOSIS — F5101 Primary insomnia: Secondary | ICD-10-CM

## 2022-10-07 NOTE — Telephone Encounter (Signed)
Pt is calling back and stated he is out of his medication.

## 2022-10-09 ENCOUNTER — Ambulatory Visit: Payer: PPO

## 2022-10-09 ENCOUNTER — Encounter: Payer: Self-pay | Admitting: Adult Health

## 2022-10-09 ENCOUNTER — Ambulatory Visit (INDEPENDENT_AMBULATORY_CARE_PROVIDER_SITE_OTHER): Payer: PPO | Admitting: Adult Health

## 2022-10-09 VITALS — BP 160/80 | HR 75 | Temp 98.5°F | Ht 68.75 in | Wt 174.0 lb

## 2022-10-09 DIAGNOSIS — M19012 Primary osteoarthritis, left shoulder: Secondary | ICD-10-CM | POA: Diagnosis not present

## 2022-10-09 DIAGNOSIS — M25512 Pain in left shoulder: Secondary | ICD-10-CM

## 2022-10-09 DIAGNOSIS — I10 Essential (primary) hypertension: Secondary | ICD-10-CM | POA: Diagnosis not present

## 2022-10-09 DIAGNOSIS — M79622 Pain in left upper arm: Secondary | ICD-10-CM

## 2022-10-09 DIAGNOSIS — Z981 Arthrodesis status: Secondary | ICD-10-CM | POA: Diagnosis not present

## 2022-10-09 MED ORDER — CYCLOBENZAPRINE HCL 5 MG PO TABS
ORAL_TABLET | ORAL | 0 refills | Status: AC
Start: 2022-10-09 — End: ?

## 2022-10-09 MED ORDER — METHYLPREDNISOLONE 4 MG PO TBPK
ORAL_TABLET | ORAL | 0 refills | Status: DC
Start: 2022-10-09 — End: 2022-10-22

## 2022-10-09 NOTE — Progress Notes (Signed)
Subjective:    Patient ID: Harold Wilson., male    DOB: 1945/06/23, 77 y.o.   MRN: [id number]  Shoulder Pain    77 year old male who  has a past medical history of BENIGN PROSTATIC HYPERTROPHY (02/06/2007), COLONIC POLYPS, HX OF (02/06/2007), COVID (09/18/2020), DIVERTICULOSIS, COLON (02/06/2007), ERECTILE DYSFUNCTION, NON-ORGANIC, MILD (02/23/2009), HYPERLIPIDEMIA (02/22/2008), Hypertension, and OSTEOARTHRITIS (02/06/2007).  He presents to the office today for continue left shoulder pain.  He was seen about 3 months ago for left shoulder pain and had present for a week or 2, at this time he believes that he injured his shoulder picking up and playing with his grandkids.  He had pain with raising his arm over his head but no loss of range of motion or strength.  He was prescribed a Medrol Dosepak and reports that this worked well to help alleviate some pain.  Unfortunately he reports that he fell about a month ago after getting tripped up and landed onto his left elbow and he fell as when he landed on his left elbow that jammed up into his left shoulder.  He has had pain and loss of strength since.  Pain is located in the left shoulder as well and is in his bicep, deltoid, and triceps muscles.  Pain is present with certain range of motion such as when twisting the jar lid off, or externally rotating his left arm.    Review of Systems See HPI   Past Medical History:  Diagnosis Date   BENIGN PROSTATIC HYPERTROPHY 02/06/2007   COLONIC POLYPS, HX OF 02/06/2007   COVID 09/18/2020   per pt   DIVERTICULOSIS, COLON 02/06/2007   ERECTILE DYSFUNCTION, NON-ORGANIC, MILD 02/23/2009   HYPERLIPIDEMIA 02/22/2008   Hypertension    OSTEOARTHRITIS 02/06/2007    Social History   Socioeconomic History   Marital status: Married    Spouse name: Not on file   Number of children: 5   Years of education: Not on file   Highest education level: GED or equivalent  Occupational History   Occupation:  retired  Tobacco Use   Smoking status: Former   Smokeless tobacco: Never  Vaping Use   Vaping status: Never Used  Substance and Sexual Activity   Alcohol use: Yes    Alcohol/week: 1.0 standard drink of alcohol    Types: 1 Cans of beer per week   Drug use: No   Sexual activity: Not on file  Other Topics Concern   Not on file  Social History Narrative   Retired    Married    Social Determinants of Health   Financial Resource Strain: Low Risk  (10/16/2021)   Overall Financial Resource Strain (CARDIA)    Difficulty of Paying Living Expenses: Not hard at all  Food Insecurity: No Food Insecurity (10/16/2021)   Hunger Vital Sign    Worried About Running Out of Food in the Last Year: Never true    Ran Out of Food in the Last Year: Never true  Transportation Needs: No Transportation Needs (06/29/2022)   PRAPARE - Administrator, Civil Service (Medical): No    Lack of Transportation (Non-Medical): No  Physical Activity: Inactive (10/16/2021)   Exercise Vital Sign    Days of Exercise per Week: 0 days    Minutes of Exercise per Session: 0 min  Stress: No Stress Concern Present (10/16/2021)   [employer] of Occupational Health - Occupational Stress Questionnaire    Feeling of Stress : Not at  all  Social Connections: Unknown (06/29/2022)   Social Connection and Isolation Panel [NHANES]    Frequency of Communication with Friends and Family: Not on file    Frequency of Social Gatherings with Friends and Family: Not on file    Attends Religious Services: Not on file    Active Member of Clubs or Organizations: No    Attends Banker Meetings: Not on file    Marital Status: Married  Intimate Partner Violence: Unknown (06/07/2021)   Received from [facility]   HITS    Physically Hurt: Not on file    Insult or Talk Down To: Not on file    Threaten Physical Harm: Not on file    Scream or Curse: Not on file    Past Surgical History:  Procedure Laterality Date    CERVICAL SPINE SURGERY     ROTATOR CUFF REPAIR     TEMPOROMANDIBULAR JOINT SURGERY     TONSILLECTOMY     VASECTOMY      Family History  Problem Relation Age of Onset   COPD Mother    Heart disease Mother    Heart failure Mother    Cancer Father        lung ca    Allergies  Allergen Reactions   Nickel Rash   Sulfacetamide Sodium     Dryness of mouth     Current Outpatient Medications on File Prior to Visit  Medication Sig Dispense Refill   amLODipine (NORVASC) 10 MG tablet TAKE ONE TABLET BY MOUTH DAILY 90 tablet 3   brimonidine (ALPHAGAN) 0.2 % ophthalmic solution      cyclobenzaprine (FLEXERIL) 5 MG tablet TAKE ONE TABLET BY MOUTH THREE TIMES A DAY AS NEEDED FOR MUSCLE SPASMS 30 tablet 0   diclofenac (VOLTAREN) 75 MG EC tablet Take 1 tablet (75 mg total) by mouth 2 (two) times daily as needed. 60 tablet 2   FLUoxetine (PROZAC) 40 MG capsule Take 1 capsule (40 mg total) by mouth daily. 90 capsule 1   fluticasone (FLONASE) 50 MCG/ACT nasal spray Place 2 sprays into both nostrils daily. 48 g 0   gabapentin (NEURONTIN) 100 MG capsule Take 1 capsule (100 mg total) by mouth 3 (three) times daily as needed. 30 capsule 2   lisinopril (ZESTRIL) 40 MG tablet Take 1 tablet (40 mg total) by mouth daily. 90 tablet 3   methocarbamol (ROBAXIN-750) 750 MG tablet Take 1 tablet (750 mg total) by mouth 2 (two) times daily as needed for muscle spasms. 20 tablet 1   methylPREDNISolone (MEDROL DOSEPAK) 4 MG TBPK tablet Take as directed 21 tablet 0   naproxen (EC NAPROSYN) 500 MG EC tablet Take 1 tablet (500 mg total) by mouth 2 (two) times daily with a meal. 180 tablet 0   tadalafil (CIALIS) 5 MG tablet TAKE 1 TABLET BY MOUTH DAILY 30 tablet 0   tamsulosin (FLOMAX) 0.4 MG CAPS capsule Take 1 capsule (0.4 mg total) by mouth daily. 90 capsule 1   traMADol (ULTRAM) 50 MG tablet TAKE 1 TO 2 TABLETS BY MOUTH DAILY AS NEEDED 20 tablet 2   zolpidem (AMBIEN) 10 MG tablet TAKE ONE TABLET BY MOUTH AT  BEDTIME AS NEEDED 30 tablet 2   No current facility-administered medications on file prior to visit.    BP (!) 160/80   Pulse 75   Temp 98.5 F (36.9 C) (Oral)   Ht 5' 8.75" (1.746 m)   Wt 174 lb (78.9 kg)   SpO2 97%  BMI 25.88 kg/m       Objective:   Physical Exam Vitals and nursing note reviewed.  Constitutional:      Appearance: Normal appearance.  Musculoskeletal:        General: Tenderness present. Normal range of motion.     Left shoulder: No tenderness or bony tenderness. Normal range of motion. Normal strength.     Left upper arm: Tenderness present. No swelling, edema, deformity or bony tenderness.     Comments: Is able to raise his hand above his head but has pain with doing so.  Can perform back scratch test without difficulty.  Has weakness with external rotation dense resistance  Skin:    General: Skin is warm and dry.     Capillary Refill: Capillary refill takes less than 2 seconds.  Neurological:     General: No focal deficit present.     Mental Status: He is alert and oriented to person, place, and time.     Comments: 5/5 grip strength bilaterally   Psychiatric:        Mood and Affect: Mood normal.        Behavior: Behavior normal.        Thought Content: Thought content normal.        Judgment: Judgment normal.       Assessment & Plan:  1. Acute pain of left shoulder - Will get xray today.  - methylPREDNISolone (MEDROL DOSEPAK) 4 MG TBPK tablet; Take as directed  Dispense: 21 tablet; Refill: 0 - cyclobenzaprine (FLEXERIL) 5 MG tablet; TAKE ONE TABLET BY MOUTH THREE TIMES A DAY AS NEEDED FOR MUSCLE SPASMS  Dispense: 15 tablet; Refill: 0  2. Essential hypertension - Elevated in the office. He did not take his medication yet today   3. Pain of left upper arm - likely muscle strain. Does not appear to be any muscle tear  - methylPREDNISolone (MEDROL DOSEPAK) 4 MG TBPK tablet; Take as directed  Dispense: 21 tablet; Refill: 0 - cyclobenzaprine  (FLEXERIL) 5 MG tablet; TAKE ONE TABLET BY MOUTH THREE TIMES A DAY AS NEEDED FOR MUSCLE SPASMS  Dispense: 15 tablet; Refill: 0  [name], NP

## 2022-10-09 NOTE — Addendum Note (Signed)
Addended by: Nancy Fetter on: 10/09/2022 11:30 AM   Modules accepted: Orders

## 2022-10-18 ENCOUNTER — Telehealth: Payer: Self-pay | Admitting: Adult Health

## 2022-10-18 NOTE — Telephone Encounter (Signed)
Pt returned call for results.

## 2022-10-21 ENCOUNTER — Telehealth: Payer: Self-pay

## 2022-10-21 NOTE — Telephone Encounter (Signed)
Unsuccessful attempts to reach patient on preferred number listed in notes for scheduled AWV. Left message on voicemail okay to reschedule.

## 2022-10-22 ENCOUNTER — Ambulatory Visit (INDEPENDENT_AMBULATORY_CARE_PROVIDER_SITE_OTHER): Payer: PPO | Admitting: Adult Health

## 2022-10-22 ENCOUNTER — Encounter: Payer: Self-pay | Admitting: Adult Health

## 2022-10-22 VITALS — BP 100/60 | HR 76 | Temp 97.9°F | Ht 68.75 in | Wt 173.0 lb

## 2022-10-22 DIAGNOSIS — N401 Enlarged prostate with lower urinary tract symptoms: Secondary | ICD-10-CM | POA: Diagnosis not present

## 2022-10-22 DIAGNOSIS — G8929 Other chronic pain: Secondary | ICD-10-CM

## 2022-10-22 DIAGNOSIS — M2550 Pain in unspecified joint: Secondary | ICD-10-CM

## 2022-10-22 DIAGNOSIS — R351 Nocturia: Secondary | ICD-10-CM

## 2022-10-22 MED ORDER — TADALAFIL 5 MG PO TABS
5.0000 mg | ORAL_TABLET | Freq: Every day | ORAL | 3 refills | Status: DC
Start: 2022-10-22 — End: 2023-07-18

## 2022-10-22 MED ORDER — METHYLPREDNISOLONE ACETATE 80 MG/ML IJ SUSP
80.0000 mg | Freq: Once | INTRAMUSCULAR | Status: AC
Start: 2022-10-22 — End: 2022-10-22
  Administered 2022-10-22: 80 mg via INTRA_ARTICULAR

## 2022-10-22 NOTE — Telephone Encounter (Signed)
This was taken care of. Pt was seen today.

## 2022-10-22 NOTE — Progress Notes (Signed)
Subjective:    Patient ID: Harold Wilson., male    DOB: [birth year], 77 y.o.   MRN: [id number]  HPI 77 year old male who  has a past medical history of BENIGN PROSTATIC HYPERTROPHY (02/06/2007), COLONIC POLYPS, HX OF (02/06/2007), COVID (09/18/2020), DIVERTICULOSIS, COLON (02/06/2007), ERECTILE DYSFUNCTION, NON-ORGANIC, MILD (02/23/2009), HYPERLIPIDEMIA (02/22/2008), Hypertension, and OSTEOARTHRITIS (02/06/2007).  He presents to the office today for continued left shoulder pain.  Was originally seen about 4 months ago for this issue at that time his pain has been present for 1 to 2 weeks I believe that he injured his shoulder picking up and playing with his grandkids.  He was prescribed a Medrol Dosepak and reported that this worked well to help alleviate some pain.  He was then seen roughly 2 weeks ago after the pain came back to tripping and landing on his left elbow and felt as though his arm jammed into his shoulder.  He had pain and loss of range of motion since.  This time we did an x-ray which showed mild glenohumeral and acro clavicular osteoarthritis.  He is here today for steroid injection in the hopes that this alleviates his pain  He also needs a refill of cialis 5 mg for BPH    Review of Systems See HPI   Past Medical History:  Diagnosis Date   BENIGN PROSTATIC HYPERTROPHY 02/06/2007   COLONIC POLYPS, HX OF 02/06/2007   COVID 09/18/2020   per pt   DIVERTICULOSIS, COLON 02/06/2007   ERECTILE DYSFUNCTION, NON-ORGANIC, MILD 02/23/2009   HYPERLIPIDEMIA 02/22/2008   Hypertension    OSTEOARTHRITIS 02/06/2007    Social History   Socioeconomic History   Marital status: Married    Spouse name: Not on file   Number of children: 5   Years of education: Not on file   Highest education level: GED or equivalent  Occupational History   Occupation: retired  Tobacco Use   Smoking status: Former   Smokeless tobacco: Never  Advertising account planner   Vaping status: Never Used  Substance  and Sexual Activity   Alcohol use: Yes    Alcohol/week: 1.0 standard drink of alcohol    Types: 1 Cans of beer per week   Drug use: No   Sexual activity: Not on file  Other Topics Concern   Not on file  Social History Narrative   Retired    Married    Social Determinants of Health   Financial Resource Strain: Low Risk  (10/16/2021)   Overall Financial Resource Strain (CARDIA)    Difficulty of Paying Living Expenses: Not hard at all  Food Insecurity: No Food Insecurity (10/16/2021)   Hunger Vital Sign    Worried About Running Out of Food in the Last Year: Never true    Ran Out of Food in the Last Year: Never true  Transportation Needs: No Transportation Needs (06/29/2022)   PRAPARE - Administrator, Civil Service (Medical): No    Lack of Transportation (Non-Medical): No  Physical Activity: Inactive (10/16/2021)   Exercise Vital Sign    Days of Exercise per Week: 0 days    Minutes of Exercise per Session: 0 min  Stress: No Stress Concern Present (10/16/2021)   [employer] of Occupational Health - Occupational Stress Questionnaire    Feeling of Stress : Not at all  Social Connections: Unknown (06/29/2022)   Social Connection and Isolation Panel [NHANES]    Frequency of Communication with Friends and Family: Not on file  Frequency of Social Gatherings with Friends and Family: Not on file    Attends Religious Services: Not on file    Active Member of Clubs or Organizations: No    Attends Banker Meetings: Not on file    Marital Status: Married  Intimate Partner Violence: Unknown (06/07/2021)   Received from [facility]   HITS    Physically Hurt: Not on file    Insult or Talk Down To: Not on file    Threaten Physical Harm: Not on file    Scream or Curse: Not on file    Past Surgical History:  Procedure Laterality Date   CERVICAL SPINE SURGERY     ROTATOR CUFF REPAIR     TEMPOROMANDIBULAR JOINT SURGERY     TONSILLECTOMY     VASECTOMY       Family History  Problem Relation Age of Onset   COPD Mother    Heart disease Mother    Heart failure Mother    Cancer Father        lung ca    Allergies  Allergen Reactions   Nickel Rash   Sulfacetamide Sodium     Dryness of mouth     Current Outpatient Medications on File Prior to Visit  Medication Sig Dispense Refill   amLODipine (NORVASC) 10 MG tablet TAKE ONE TABLET BY MOUTH DAILY 90 tablet 3   brimonidine (ALPHAGAN) 0.2 % ophthalmic solution      cyclobenzaprine (FLEXERIL) 5 MG tablet TAKE ONE TABLET BY MOUTH THREE TIMES A DAY AS NEEDED FOR MUSCLE SPASMS 15 tablet 0   diclofenac (VOLTAREN) 75 MG EC tablet Take 1 tablet (75 mg total) by mouth 2 (two) times daily as needed. 60 tablet 2   FLUoxetine (PROZAC) 40 MG capsule Take 1 capsule (40 mg total) by mouth daily. 90 capsule 1   fluticasone (FLONASE) 50 MCG/ACT nasal spray Place 2 sprays into both nostrils daily. 48 g 0   gabapentin (NEURONTIN) 100 MG capsule Take 1 capsule (100 mg total) by mouth 3 (three) times daily as needed. 30 capsule 2   lisinopril (ZESTRIL) 40 MG tablet Take 1 tablet (40 mg total) by mouth daily. 90 tablet 3   methylPREDNISolone (MEDROL DOSEPAK) 4 MG TBPK tablet Take as directed 21 tablet 0   naproxen (EC NAPROSYN) 500 MG EC tablet Take 1 tablet (500 mg total) by mouth 2 (two) times daily with a meal. 180 tablet 0   tadalafil (CIALIS) 5 MG tablet TAKE 1 TABLET BY MOUTH DAILY 30 tablet 0   tamsulosin (FLOMAX) 0.4 MG CAPS capsule Take 1 capsule (0.4 mg total) by mouth daily. 90 capsule 1   traMADol (ULTRAM) 50 MG tablet TAKE 1 TO 2 TABLETS BY MOUTH DAILY AS NEEDED 20 tablet 2   zolpidem (AMBIEN) 10 MG tablet TAKE ONE TABLET BY MOUTH AT BEDTIME AS NEEDED 30 tablet 2   No current facility-administered medications on file prior to visit.    There were no vitals taken for this visit.      Objective:   Physical Exam Vitals and nursing note reviewed.  Constitutional:      Appearance: Normal  appearance.  Cardiovascular:     Rate and Rhythm: Normal rate and regular rhythm.     Pulses: Normal pulses.     Heart sounds: Normal heart sounds.  Musculoskeletal:        General: Normal range of motion.     Left shoulder: No tenderness. Normal range of motion. Decreased strength.  Comments: No decreased ROM but has pain in the shoulder when raising arm above 90 degree in all directions   Skin:    General: Skin is warm and dry.  Neurological:     General: No focal deficit present.     Mental Status: He is alert and oriented to person, place, and time.  Psychiatric:        Mood and Affect: Mood normal.        Thought Content: Thought content normal.        Judgment: Judgment normal.       Assessment & Plan:  1. Chronic joint pain Shoulder injection Verbal consent obtained and verified. Sterile betadine prep. Furthur cleansed with alcohol. Topical analgesic spray: Ethyl chloride. Joint: left subacromial injection Approached in typical fashion with: posterior approach Completed without difficulty Meds: 3 cc lidocaine 2% no epi, 1 cc depomedrol 80mg /cc Needle:1.5 inch 25 gauge Aftercare instructions and Red flags advised.  - methylPREDNISolone acetate (DEPO-MEDROL) injection 80 mg - Consider MRI if not improved  2. Benign prostatic hyperplasia with nocturia  - tadalafil (CIALIS) 5 MG tablet; Take 1 tablet (5 mg total) by mouth daily.  Dispense: 90 tablet; Refill: 3  [name], NP

## 2022-10-24 ENCOUNTER — Ambulatory Visit (INDEPENDENT_AMBULATORY_CARE_PROVIDER_SITE_OTHER): Payer: PPO | Admitting: Family Medicine

## 2022-10-24 ENCOUNTER — Encounter: Payer: Self-pay | Admitting: Family Medicine

## 2022-10-24 VITALS — Wt 173.0 lb

## 2022-10-24 DIAGNOSIS — Z0000 Encounter for general adult medical examination without abnormal findings: Secondary | ICD-10-CM | POA: Diagnosis not present

## 2022-10-24 NOTE — Progress Notes (Signed)
PATIENT CHECK-IN and HEALTH RISK ASSESSMENT QUESTIONNAIRE:  -completed by phone/video for upcoming Medicare Preventive Visit  Pre-Visit Check-in: 1)Vitals (height, wt, BP, etc) - record in vitals section for visit on day of visit Request home vitals (wt, BP, etc.) and enter into vitals, THEN update Vital Signs SmartPhrase below at the top of the HPI. See below.  2)Review and Update Medications, Allergies PMH, Surgeries, Social history in Epic 3)Hospitalizations in the last year with date/reason? No  4)Review and Update Care Team (patient's specialists) in Epic 5) Complete PHQ9 in Epic  6) Complete Fall Screening in Epic 7)Review all Health Maintenance Due and order under PCP if not done.  Medicare Wellness Patient Questionnaire:  Answer theses question about your habits: Do you drink alcohol? Yes If yes, how many drinks do you have a day? Less than 1 Have you ever smoked?Yes Quit date if applicable? 49 years ago  How many packs a day do/did you smoke? 1  Do you use smokeless tobacco?No Do you use an illicit drugs? No Do you exercises? Yes IF so, what type and how many days/minutes per week? Walks 5 days per week about 30 minutes, also takes care of 35 year old grandchild Are you sexually active? No Number of partners?  Reports eats mostly at home, hello fresh Typical breakfast: Varies  Typical lunch: Varies  Typical dinner: Varies  Typical snacks: Pretzels   Beverages: Diet coke   Answer theses question about you: Can you perform most household chores?Yes Do you find it hard to follow a conversation in a noisy room? Sometimes - has hearing aids, they help Do you often ask people to speak up or repeat themselves? No Do you feel that you have a problem with memory? No Do you balance your checkbook and or bank acounts?No Do you feel safe at home? Yes Last dentist visit? 6 months ago Do you need assistance with any of the following: Please note if so No  Driving?  Feeding  yourself?  Getting from bed to chair?  Getting to the toilet?  Bathing or showering?  Dressing yourself?  Managing money?  Climbing a flight of stairs  Preparing meals?    Do you have Advanced Directives in place (Living Will, Healthcare Power or Attorney)? Yes   Last eye Exam and location? 6-8 Months ago, [facility]   Do you currently use prescribed or non-prescribed narcotic or opioid pain medications? No  Do you have a history or close family history of breast, ovarian, tubal or peritoneal cancer or a family member with BRCA (breast cancer susceptibility 1 and 2) gene mutations? No  Request home vitals (wt, BP, etc.) and enter into vitals, THEN update Vital Signs SmartPhrase below at the top of the HPI. See below.   Nurse/Assistant Credentials/time stamp: MG 11:52 AM    ----------------------------------------------------------------------------------------------------------------------------------------------------------------------------------------------------------------------  Vital Signs: Unable to obtain new vitals due to this being a telehealth visit.   MEDICARE ANNUAL PREVENTIVE CARE VISIT WITH PROVIDER (Welcome to Medicare, initial annual wellness or annual wellness exam)  Virtual Visit via Phone Note  I connected with Kelvin Cellar. on 10/24/22  by phone and verified that I am speaking with the correct person using two identifiers.  Location patient: home Location provider:work or home office Persons participating in the virtual visit: patient, provider  Concerns and/or follow up today: none   See HM section in Epic for other details of completed HM.    ROS: negative for report of fevers, unintentional weight loss,  vision changes, vision loss, hearing loss or change, chest pain, sob, hemoptysis, melena, hematochezia, hematuria, falls, bleeding or bruising, thoughts of suicide or self harm, memory loss  Patient-completed extensive health risk  assessment - reviewed and discussed with the patient: See Health Risk Assessment completed with patient prior to the visit either above or in recent phone note. This was reviewed in detailed with the patient today and appropriate recommendations, orders and referrals were placed as needed per Summary below and patient instructions.   Review of Medical History: -PMH, PSH, Family History and current specialty and care providers reviewed and updated and listed below   Patient Care Team: [name], NP as PCP - General (Family Medicine) O'Neal, Ronnald Ramp, MD as PCP - Cardiology (Cardiology) Verner Chol, [facility] (Inactive) as Pharmacist (Pharmacist)   Past Medical History:  Diagnosis Date   BENIGN PROSTATIC HYPERTROPHY 02/06/2007   COLONIC POLYPS, HX OF 02/06/2007   COVID 09/18/2020   per pt   DIVERTICULOSIS, COLON 02/06/2007   ERECTILE DYSFUNCTION, NON-ORGANIC, MILD 02/23/2009   HYPERLIPIDEMIA 02/22/2008   Hypertension    OSTEOARTHRITIS 02/06/2007    Past Surgical History:  Procedure Laterality Date   CERVICAL SPINE SURGERY     ROTATOR CUFF REPAIR     TEMPOROMANDIBULAR JOINT SURGERY     TONSILLECTOMY     VASECTOMY      Social History   Socioeconomic History   Marital status: Married    Spouse name: Not on file   Number of children: 5   Years of education: Not on file   Highest education level: GED or equivalent  Occupational History   Occupation: retired  Tobacco Use   Smoking status: Former   Smokeless tobacco: Never  Vaping Use   Vaping status: Never Used  Substance and Sexual Activity   Alcohol use: Yes    Alcohol/week: 1.0 standard drink of alcohol    Types: 1 Cans of beer per week   Drug use: No   Sexual activity: Not on file  Other Topics Concern   Not on file  Social History Narrative   Retired    Married    Social Determinants of Health   Financial Resource Strain: Low Risk  (10/24/2022)   Overall Financial Resource Strain (CARDIA)     Difficulty of Paying Living Expenses: Not hard at all  Food Insecurity: No Food Insecurity (10/24/2022)   Hunger Vital Sign    Worried About Running Out of Food in the Last Year: Never true    Ran Out of Food in the Last Year: Never true  Transportation Needs: No Transportation Needs (06/29/2022)   PRAPARE - Administrator, Civil Service (Medical): No    Lack of Transportation (Non-Medical): No  Physical Activity: Sufficiently Active (10/24/2022)   Exercise Vital Sign    Days of Exercise per Week: 5 days    Minutes of Exercise per Session: 30 min  Stress: No Stress Concern Present (10/24/2022)   [employer] of Occupational Health - Occupational Stress Questionnaire    Feeling of Stress : Not at all  Social Connections: Unknown (06/29/2022)   Social Connection and Isolation Panel [NHANES]    Frequency of Communication with Friends and Family: Not on file    Frequency of Social Gatherings with Friends and Family: Not on file    Attends Religious Services: Not on file    Active Member of Clubs or Organizations: No    Attends Banker Meetings: Not on file  Marital Status: Married  Catering manager Violence: Not At Risk (10/24/2022)   Humiliation, Afraid, Rape, and Kick questionnaire    Fear of Current or Ex-Partner: No    Emotionally Abused: No    Physically Abused: No    Sexually Abused: No    Family History  Problem Relation Age of Onset   COPD Mother    Heart disease Mother    Heart failure Mother    Cancer Father        lung ca    Current Outpatient Medications on File Prior to Visit  Medication Sig Dispense Refill   amLODipine (NORVASC) 10 MG tablet TAKE ONE TABLET BY MOUTH DAILY 90 tablet 3   brimonidine (ALPHAGAN) 0.2 % ophthalmic solution      cyclobenzaprine (FLEXERIL) 5 MG tablet TAKE ONE TABLET BY MOUTH THREE TIMES A DAY AS NEEDED FOR MUSCLE SPASMS 15 tablet 0   diclofenac (VOLTAREN) 75 MG EC tablet Take 1 tablet (75 mg total) by mouth 2  (two) times daily as needed. 60 tablet 2   FLUoxetine (PROZAC) 40 MG capsule Take 1 capsule (40 mg total) by mouth daily. 90 capsule 1   fluticasone (FLONASE) 50 MCG/ACT nasal spray Place 2 sprays into both nostrils daily. 48 g 0   gabapentin (NEURONTIN) 100 MG capsule Take 1 capsule (100 mg total) by mouth 3 (three) times daily as needed. 30 capsule 2   lisinopril (ZESTRIL) 40 MG tablet Take 1 tablet (40 mg total) by mouth daily. 90 tablet 3   tadalafil (CIALIS) 5 MG tablet Take 1 tablet (5 mg total) by mouth daily. 90 tablet 3   tamsulosin (FLOMAX) 0.4 MG CAPS capsule Take 1 capsule (0.4 mg total) by mouth daily. 90 capsule 1   traMADol (ULTRAM) 50 MG tablet TAKE 1 TO 2 TABLETS BY MOUTH DAILY AS NEEDED 20 tablet 2   zolpidem (AMBIEN) 10 MG tablet TAKE ONE TABLET BY MOUTH AT BEDTIME AS NEEDED 30 tablet 2   naproxen (EC NAPROSYN) 500 MG EC tablet Take 1 tablet (500 mg total) by mouth 2 (two) times daily with a meal. (Patient not taking: Reported on 10/24/2022) 180 tablet 0   No current facility-administered medications on file prior to visit.    Allergies  Allergen Reactions   Nickel Rash   Sulfacetamide Sodium     Dryness of mouth        Physical Exam Vitals requested from patient and listed below if patient had equipment and was able to obtain at home for this virtual visit: There were no vitals filed for this visit. Estimated body mass index is 25.73 kg/m as calculated from the following:   Height as of 10/22/22: 5' 8.75" (1.746 m).   Weight as of this encounter: 173 lb (78.5 kg).  EKG (optional): deferred due to virtual visit  GENERAL: alert, oriented, no acute distress detected; full vision exam deferred due to pandemic and/or virtual encounter  MS: moves all visible extremities without noticeable abnormality  PSYCH/NEURO: pleasant and cooperative, no obvious depression or anxiety, speech and thought processing grossly intact, Cognitive function grossly intact  Flowsheet Row  Office Visit from 10/24/2022 in [facility]  PHQ-9 Total Score 4           10/24/2022   11:41 AM 10/22/2022   10:12 AM 07/26/2022    1:24 PM 10/16/2021    2:36 PM 06/28/2021   12:03 PM  Depression screen PHQ 2/9  Decreased Interest 0 2  0  0  Down, Depressed, Hopeless 1 2 1  0 1  PHQ - 2 Score 1 4 1  0 1  Altered sleeping 0 2 1  1   Tired, decreased energy 1 2 1   0  Change in appetite 0 0   0  Feeling bad or failure about yourself  1 1 3  1   Trouble concentrating 1 2 1   0  Moving slowly or fidgety/restless 0 0   1  Suicidal thoughts 0 0   0  PHQ-9 Score 4 11 7  4   Difficult doing work/chores Not difficult at all Somewhat difficult Somewhat difficult         10/10/2020    1:45 PM 10/16/2021    2:36 PM 06/29/2022   11:44 AM 07/26/2022    1:24 PM 10/24/2022   11:42 AM  Fall Risk  Falls in the past year? 1 0 0 1 1  Was there an injury with Fall? 1 0  1 1  Was there an injury with Fall? - Comments right shoulder      Fall Risk Category Calculator 3 0  2 3  Fall Risk Category (Retired) High Low     (RETIRED) Patient Fall Risk Level  Low fall risk     Patient at Risk for Falls Due to Impaired vision Medication side effect  No Fall Risks No Fall Risks  Fall risk Follow up Falls prevention discussed Falls evaluation completed;Education provided;Falls prevention discussed  Falls evaluation completed Falls evaluation completed     SUMMARY AND PLAN:  Encounter for Medicare annual wellness exam   Discussed applicable health maintenance/preventive health measures and advised and referred or ordered per patient preferences: -discussed vaccine due, recommendations and when due Health Maintenance  Topic Date Due   DTaP/Tdap/Td (2 - Td or Tdap) 09/06/2021   COVID-19 Vaccine (6 - 2023-24 season) 11/02/2021   INFLUENZA VACCINE  10/03/2022   Colonoscopy  08/12/2023   Medicare Annual Wellness (AWV)  10/24/2023   Pneumonia Vaccine 46+ Years old  Completed    Hepatitis C Screening  Completed   Zoster Vaccines- Shingrix  Completed   HPV VACCINES  Aged Out    Education and counseling on the following was provided based on the above review of health and a plan/checklist for the patient, along with additional information discussed, was provided for the patient in the patient instructions :   -Provided safe balance exercises that can be done at home to improve balance and discussed exercise guidelines for adults with include balance exercises at least 3 days per week.  -Advised and counseled on a healthy lifestyle  -Reviewed patient's current diet. Advised and counseled on a whole foods based healthy diet. A summary of a healthy diet was provided in the Patient Instructions.  -reviewed patient's current physical activity level and discussed exercise guidelines for adults. Discussed community resources and ideas for safe exercise at home to assist in meeting exercise guideline recommendations in a safe and healthy way.  -Advise yearly dental visits at minimum and regular eye exams -Advised and counseled on alcohol safe limits   Follow up: see patient instructions   Patient Instructions  I really enjoyed getting to talk with you today! I am available on Tuesdays and Thursdays for virtual visits if you have any questions or concerns, or if I can be of any further assistance.   CHECKLIST FROM ANNUAL WELLNESS VISIT:  -Follow up (please call to schedule if not scheduled after visit):   -yearly for annual wellness visit  with primary care office  Here is a list of your preventive care/health maintenance measures and the plan for each if any are due:  PLAN For any measures below that may be due:  -can get vaccines due at the pharmacy, please let us know when you do so that we can update your record  Health Maintenance  Topic Date Due   DTaP/Tdap/Td (2 - Td or Tdap) 09/06/2021   COVID-19 Vaccine (6 - 2023-24 season) 11/02/2021   INFLUENZA VACCINE   10/03/2022   Colonoscopy  08/12/2023   Medicare Annual Wellness (AWV)  10/24/2023   Pneumonia Vaccine 98+ Years old  Completed   Hepatitis C Screening  Completed   Zoster Vaccines- Shingrix  Completed   HPV VACCINES  Aged Out    -See a dentist at least yearly  -Get your eyes checked and then per your eye specialist's recommendations  -Other issues addressed today:   -I have included below further information regarding a healthy whole foods based diet, physical activity guidelines for adults, stress management and opportunities for social connections. I hope you find this information useful.   -----------------------------------------------------------------------------------------------------------------------------------------------------------------------------------------------------------------------------------------------------------  NUTRITION: -eat real food: lots of colorful vegetables (half the plate) and fruits -5-7 servings of vegetables and fruits per day (fresh or steamed is best), exp. 2 servings of vegetables with lunch and dinner and 2 servings of fruit per day. Berries and greens such as kale and collards are great choices.  -consume on a regular basis: whole grains (make sure first ingredient on label contains the word "whole"), fresh fruits, fish, nuts, seeds, healthy oils (such as olive oil, avocado oil, grape seed oil) -may eat small amounts of dairy and lean meat on occasion, but avoid processed meats such as ham, bacon, lunch meat, etc. -drink water -try to avoid fast food and pre-packaged foods, processed meat -most experts advise limiting sodium to < 2300mg  per day, should limit further is any chronic conditions such as high blood pressure, heart disease, diabetes, etc. The American Heart Association advised that < 1500mg  is is ideal -try to avoid foods that contain any ingredients with names you do not recognize  -try to avoid sugar/sweets (except for the  natural sugar that occurs in fresh fruit) -try to avoid sweet drinks -try to avoid white rice, white bread, pasta (unless whole grain), white or yellow potatoes  EXERCISE GUIDELINES FOR ADULTS: -if you wish to increase your physical activity, do so gradually and with the approval of your doctor -STOP and seek medical care immediately if you have any chest pain, chest discomfort or trouble breathing when starting or increasing exercise  -move and stretch your body, legs, feet and arms when sitting for long periods -Physical activity guidelines for optimal health in adults: -least 150 minutes per week of aerobic exercise (can talk, but not sing) once approved by your doctor, 20-30 minutes of sustained activity or two 10 minute episodes of sustained activity every day.  -resistance training at least 2 days per week if approved by your doctor -balance exercises 3+ days per week:   Stand somewhere where you have something sturdy to hold onto if you lose balance.    1) lift up on toes, start with 5x per day and work up to 20x   2) stand and lift on leg straight out to the side so that foot is a few inches of the floor, start with 5x each side and work up to 20x each side   3) stand on one foot,  start with 5 seconds each side and work up to 20 seconds on each side  If you need ideas or help with getting more active:  -Silver sneakers https://tools.silversneakers.com  -Walk with a Doc: http://www.duncan-williams.com/  -try to include resistance (weight lifting/strength building) and balance exercises twice per week: or the following link for ideas: http://castillo-powell.com/  BuyDucts.dk  STRESS MANAGEMENT: -can try meditating, or just sitting quietly with deep breathing while intentionally relaxing all parts of your body for 5 minutes daily -if you need further help with stress, anxiety or depression please  follow up with your primary doctor or contact the wonderful folks at [facility]: [phone]  SOCIAL CONNECTIONS: -options in [place] if you wish to engage in more social and exercise related activities:  -Silver sneakers https://tools.silversneakers.com  -Walk with a Doc: http://www.duncan-williams.com/  -Check out the [facility] Active Adults 50+ section on the [place] of [employer] (hiking clubs, book clubs, cards and games, chess, exercise classes, aquatic classes and much more) - see the website for details: https://www.[place].gov/departments/parks-recreation/active-adults50  -YouTube has lots of exercise videos for different ages and abilities as well  -[facility] (a variety of indoor and outdoor inperson activities for adults). [phone]. [address].  -Virtual Online Classes (a variety of topics): see seniorplanet.org or call [phone]  -consider volunteering at a school, hospice center, church, senior center or elsewhere           Terressa Koyanagi, DO

## 2022-10-24 NOTE — Progress Notes (Signed)
 Patient unable to obtain vital signs due to telehealth visit

## 2022-10-24 NOTE — Patient Instructions (Signed)
I really enjoyed getting to talk with you today! I am available on Tuesdays and Thursdays for virtual visits if you have any questions or concerns, or if I can be of any further assistance.   CHECKLIST FROM ANNUAL WELLNESS VISIT:  -Follow up (please call to schedule if not scheduled after visit):   -yearly for annual wellness visit with primary care office  Here is a list of your preventive care/health maintenance measures and the plan for each if any are due:  PLAN For any measures below that may be due:  -can get vaccines due at the pharmacy, please let us know when you do so that we can update your record  Health Maintenance  Topic Date Due   DTaP/Tdap/Td (2 - Td or Tdap) 09/06/2021   COVID-19 Vaccine (6 - 2023-24 season) 11/02/2021   INFLUENZA VACCINE  10/03/2022   Colonoscopy  08/12/2023   Medicare Annual Wellness (AWV)  10/24/2023   Pneumonia Vaccine 67+ Years old  Completed   Hepatitis C Screening  Completed   Zoster Vaccines- Shingrix  Completed   HPV VACCINES  Aged Out    -See a dentist at least yearly  -Get your eyes checked and then per your eye specialist's recommendations  -Other issues addressed today:   -I have included below further information regarding a healthy whole foods based diet, physical activity guidelines for adults, stress management and opportunities for social connections. I hope you find this information useful.   -----------------------------------------------------------------------------------------------------------------------------------------------------------------------------------------------------------------------------------------------------------  NUTRITION: -eat real food: lots of colorful vegetables (half the plate) and fruits -5-7 servings of vegetables and fruits per day (fresh or steamed is best), exp. 2 servings of vegetables with lunch and dinner and 2 servings of fruit per day. Berries and greens such as kale and collards  are great choices.  -consume on a regular basis: whole grains (make sure first ingredient on label contains the word "whole"), fresh fruits, fish, nuts, seeds, healthy oils (such as olive oil, avocado oil, grape seed oil) -may eat small amounts of dairy and lean meat on occasion, but avoid processed meats such as ham, bacon, lunch meat, etc. -drink water -try to avoid fast food and pre-packaged foods, processed meat -most experts advise limiting sodium to < 2300mg  per day, should limit further is any chronic conditions such as high blood pressure, heart disease, diabetes, etc. The American Heart Association advised that < 1500mg  is is ideal -try to avoid foods that contain any ingredients with names you do not recognize  -try to avoid sugar/sweets (except for the natural sugar that occurs in fresh fruit) -try to avoid sweet drinks -try to avoid white rice, white bread, pasta (unless whole grain), white or yellow potatoes  EXERCISE GUIDELINES FOR ADULTS: -if you wish to increase your physical activity, do so gradually and with the approval of your doctor -STOP and seek medical care immediately if you have any chest pain, chest discomfort or trouble breathing when starting or increasing exercise  -move and stretch your body, legs, feet and arms when sitting for long periods -Physical activity guidelines for optimal health in adults: -least 150 minutes per week of aerobic exercise (can talk, but not sing) once approved by your doctor, 20-30 minutes of sustained activity or two 10 minute episodes of sustained activity every day.  -resistance training at least 2 days per week if approved by your doctor -balance exercises 3+ days per week:   Stand somewhere where you have something sturdy to hold onto if you lose balance.  1) lift up on toes, start with 5x per day and work up to 20x   2) stand and lift on leg straight out to the side so that foot is a few inches of the floor, start with 5x each side  and work up to 20x each side   3) stand on one foot, start with 5 seconds each side and work up to 20 seconds on each side  If you need ideas or help with getting more active:  -Silver sneakers https://tools.silversneakers.com  -Walk with a Doc: http://www.duncan-williams.com/  -try to include resistance (weight lifting/strength building) and balance exercises twice per week: or the following link for ideas: http://castillo-powell.com/  BuyDucts.dk  STRESS MANAGEMENT: -can try meditating, or just sitting quietly with deep breathing while intentionally relaxing all parts of your body for 5 minutes daily -if you need further help with stress, anxiety or depression please follow up with your primary doctor or contact the wonderful folks at [facility]: [phone]  SOCIAL CONNECTIONS: -options in [place] if you wish to engage in more social and exercise related activities:  -Silver sneakers https://tools.silversneakers.com  -Walk with a Doc: http://www.duncan-williams.com/  -Check out the [facility] Active Adults 50+ section on the [place] [employer] (hiking clubs, book clubs, cards and games, chess, exercise classes, aquatic classes and much more) - see the website for details: https://www.[place].gov/departments/parks-recreation/active-adults50  -YouTube has lots of exercise videos for different ages and abilities as well  -[facility] (a variety of indoor and outdoor inperson activities for adults). [phone]. [address]  -Virtual Online Classes (a variety of topics): see seniorplanet.org or call [phone]  -consider volunteering at a school, hospice center, church, senior center or elsewhere

## 2022-10-29 ENCOUNTER — Ambulatory Visit (INDEPENDENT_AMBULATORY_CARE_PROVIDER_SITE_OTHER): Payer: PPO | Admitting: Adult Health

## 2022-10-29 ENCOUNTER — Encounter: Payer: Self-pay | Admitting: Adult Health

## 2022-10-29 VITALS — BP 138/78 | HR 82 | Temp 98.2°F | Ht 67.5 in | Wt 171.0 lb

## 2022-10-29 DIAGNOSIS — F419 Anxiety disorder, unspecified: Secondary | ICD-10-CM

## 2022-10-29 DIAGNOSIS — F5101 Primary insomnia: Secondary | ICD-10-CM | POA: Diagnosis not present

## 2022-10-29 DIAGNOSIS — N401 Enlarged prostate with lower urinary tract symptoms: Secondary | ICD-10-CM

## 2022-10-29 DIAGNOSIS — M159 Polyosteoarthritis, unspecified: Secondary | ICD-10-CM | POA: Diagnosis not present

## 2022-10-29 DIAGNOSIS — Z0000 Encounter for general adult medical examination without abnormal findings: Secondary | ICD-10-CM

## 2022-10-29 DIAGNOSIS — R351 Nocturia: Secondary | ICD-10-CM | POA: Diagnosis not present

## 2022-10-29 DIAGNOSIS — I10 Essential (primary) hypertension: Secondary | ICD-10-CM

## 2022-10-29 LAB — COMPREHENSIVE METABOLIC PANEL
ALT: 13 U/L (ref 0–53)
AST: 14 U/L (ref 0–37)
Albumin: 4 g/dL (ref 3.5–5.2)
Alkaline Phosphatase: 58 U/L (ref 39–117)
BUN: 22 mg/dL (ref 6–23)
CO2: 29 mEq/L (ref 19–32)
Calcium: 8.7 mg/dL (ref 8.4–10.5)
Chloride: 105 mEq/L (ref 96–112)
Creatinine, Ser: 1.2 mg/dL (ref 0.40–1.50)
GFR: 59.51 mL/min — ABNORMAL LOW (ref >60.00)
Glucose, Bld: 85 mg/dL (ref 70–99)
Potassium: 4.6 mEq/L (ref 3.5–5.1)
Sodium: 141 mEq/L (ref 135–145)
Total Bilirubin: 0.5 mg/dL (ref 0.2–1.2)
Total Protein: 6.1 g/dL (ref 6.0–8.3)

## 2022-10-29 LAB — LIPID PANEL
Cholesterol: 166 mg/dL (ref 0–200)
HDL: 54.3 mg/dL (ref >39.00)
LDL Cholesterol: 98 mg/dL (ref 0–99)
NonHDL: 111.4
Total CHOL/HDL Ratio: 3
Triglycerides: 67 mg/dL (ref 0.0–149.0)
VLDL: 13.4 mg/dL (ref 0.0–40.0)

## 2022-10-29 LAB — CBC
HCT: 43.4 % (ref 39.0–52.0)
Hemoglobin: 14.4 g/dL (ref 13.0–17.0)
MCHC: 33.1 g/dL (ref 30.0–36.0)
MCV: 90.8 fl (ref 78.0–100.0)
Platelets: 157 10*3/uL (ref 150.0–400.0)
RBC: 4.78 Mil/uL (ref 4.22–5.81)
RDW: 13.7 % (ref 11.5–15.5)
WBC: 5.1 10*3/uL (ref 4.0–10.5)

## 2022-10-29 LAB — PSA: PSA: 3.46 ng/mL (ref 0.10–4.00)

## 2022-10-29 LAB — TSH: TSH: 1.85 u[IU]/mL (ref 0.35–5.50)

## 2022-10-29 MED ORDER — TAMSULOSIN HCL 0.4 MG PO CAPS: 0.4000 mg | ORAL_CAPSULE | Freq: Every day | ORAL | 3 refills | Status: DC

## 2022-10-29 MED ORDER — FLUOXETINE HCL 40 MG PO CAPS
40.0000 mg | ORAL_CAPSULE | Freq: Every day | ORAL | 1 refills | Status: DC
Start: 2022-10-29 — End: 2023-07-18

## 2022-10-29 NOTE — Progress Notes (Signed)
Subjective:    Patient ID: Harold Wilson., male    DOB: [birth year], 77 y.o.   MRN: [id number]  HPI Patient presents for yearly preventative medicine examination. He is a 77 year old male who  has a past medical history of BENIGN PROSTATIC HYPERTROPHY (02/06/2007), COLONIC POLYPS, HX OF (02/06/2007), COVID (09/18/2020), DIVERTICULOSIS, COLON (02/06/2007), ERECTILE DYSFUNCTION, NON-ORGANIC, MILD (02/23/2009), HYPERLIPIDEMIA (02/22/2008), Hypertension, and OSTEOARTHRITIS (02/06/2007).  Hypertension-managed with Norvasc 10 mg daily and lisinopril 40 mg daily.  He does check his blood pressure at home periodically and reports readings in the 120s to 140s over 70s to 80s.  He denies chest pain or shortness of breath. Intermittently he will have a moment of lightheadedness when standing up after sitting for a long period of time.  BP Readings from Last 3 Encounters:  10/29/22 138/78  10/22/22 100/60  10/09/22 (!) 160/80   Anxiety-managed with Prozac 20 mg daily.  He does feel well controlled on this medication.  Osteoarthritis of multiple joints-currently treated with Tramadol 50 to 100 mg twice daily, and Flexeril 5 mg 3 times daily as needed  BPH-uses Flomax 0.4 mg daily and Cialis 5 mg daily.  Insomnia-due to Ambien 10 mg nightly.  He does feel as though he gets a restful night sleep without side effects such as grogginess, dizziness, or lightheadedness when waking up in the morning  All immunizations and health maintenance protocols were reviewed with the patient and needed orders were placed. He is due for Tdap.   Appropriate screening laboratory values were ordered for the patient including screening of hyperlipidemia, renal function and hepatic function. If indicated by BPH, a PSA was ordered.  Medication reconciliation,  past medical history, social history, problem list and allergies were reviewed in detail with the patient  Goals were established with regard to weight loss,  exercise, and  diet in compliance with medications Wt Readings from Last 3 Encounters:  10/29/22 171 lb (77.6 kg)  10/24/22 173 lb (78.5 kg)  10/22/22 173 lb (78.5 kg)   Review of Systems  Constitutional: Negative.   HENT: Negative.    Eyes: Negative.   Respiratory: Negative.    Cardiovascular: Negative.   Gastrointestinal: Negative.   Endocrine: Negative.   Genitourinary: Negative.   Musculoskeletal: Negative.   Skin: Negative.   Allergic/Immunologic: Negative.   Neurological: Negative.   Hematological: Negative.   Psychiatric/Behavioral: Negative.    All other systems reviewed and are negative.  Past Medical History:  Diagnosis Date   BENIGN PROSTATIC HYPERTROPHY 02/06/2007   COLONIC POLYPS, HX OF 02/06/2007   COVID 09/18/2020   per pt   DIVERTICULOSIS, COLON 02/06/2007   ERECTILE DYSFUNCTION, NON-ORGANIC, MILD 02/23/2009   HYPERLIPIDEMIA 02/22/2008   Hypertension    OSTEOARTHRITIS 02/06/2007    Social History   Socioeconomic History   Marital status: Married    Spouse name: Not on file   Number of children: 5   Years of education: Not on file   Highest education level: GED or equivalent  Occupational History   Occupation: retired  Tobacco Use   Smoking status: Former   Smokeless tobacco: Never  Vaping Use   Vaping status: Never Used  Substance and Sexual Activity   Alcohol use: Yes    Alcohol/week: 1.0 standard drink of alcohol    Types: 1 Cans of beer per week   Drug use: No   Sexual activity: Not on file  Other Topics Concern   Not on file  Social History Narrative  Retired    Married    International aid/development worker of Corporate investment banker Strain: Low Risk  (10/24/2022)   Overall Financial Resource Strain (CARDIA)    Difficulty of Paying Living Expenses: Not hard at all  Food Insecurity: No Food Insecurity (10/24/2022)   Hunger Vital Sign    Worried About Running Out of Food in the Last Year: Never true    Ran Out of Food in the Last Year:  Never true  Transportation Needs: No Transportation Needs (06/29/2022)   PRAPARE - Administrator, Civil Service (Medical): No    Lack of Transportation (Non-Medical): No  Physical Activity: Sufficiently Active (10/24/2022)   Exercise Vital Sign    Days of Exercise per Week: 5 days    Minutes of Exercise per Session: 30 min  Stress: No Stress Concern Present (10/24/2022)   [employer] of Occupational Health - Occupational Stress Questionnaire    Feeling of Stress : Not at all  Social Connections: Unknown (06/29/2022)   Social Connection and Isolation Panel [NHANES]    Frequency of Communication with Friends and Family: Not on file    Frequency of Social Gatherings with Friends and Family: Not on file    Attends Religious Services: Not on file    Active Member of Clubs or Organizations: No    Attends Banker Meetings: Not on file    Marital Status: Married  Intimate Partner Violence: Not At Risk (10/24/2022)   Humiliation, Afraid, Rape, and Kick questionnaire    Fear of Current or Ex-Partner: No    Emotionally Abused: No    Physically Abused: No    Sexually Abused: No    Past Surgical History:  Procedure Laterality Date   CERVICAL SPINE SURGERY     ROTATOR CUFF REPAIR     TEMPOROMANDIBULAR JOINT SURGERY     TONSILLECTOMY     VASECTOMY      Family History  Problem Relation Age of Onset   COPD Mother    Heart disease Mother    Heart failure Mother    Cancer Father        lung ca    Allergies  Allergen Reactions   Nickel Rash   Sulfacetamide Sodium     Dryness of mouth     Current Outpatient Medications on File Prior to Visit  Medication Sig Dispense Refill   amLODipine (NORVASC) 10 MG tablet TAKE ONE TABLET BY MOUTH DAILY 90 tablet 3   brimonidine (ALPHAGAN) 0.2 % ophthalmic solution      cyclobenzaprine (FLEXERIL) 5 MG tablet TAKE ONE TABLET BY MOUTH THREE TIMES A DAY AS NEEDED FOR MUSCLE SPASMS 15 tablet 0   diclofenac (VOLTAREN) 75 MG  EC tablet Take 1 tablet (75 mg total) by mouth 2 (two) times daily as needed. 60 tablet 2   FLUoxetine (PROZAC) 40 MG capsule Take 1 capsule (40 mg total) by mouth daily. 90 capsule 1   fluticasone (FLONASE) 50 MCG/ACT nasal spray Place 2 sprays into both nostrils daily. 48 g 0   gabapentin (NEURONTIN) 100 MG capsule Take 1 capsule (100 mg total) by mouth 3 (three) times daily as needed. 30 capsule 2   lisinopril (ZESTRIL) 40 MG tablet Take 1 tablet (40 mg total) by mouth daily. 90 tablet 3   tadalafil (CIALIS) 5 MG tablet Take 1 tablet (5 mg total) by mouth daily. 90 tablet 3   tamsulosin (FLOMAX) 0.4 MG CAPS capsule Take 1 capsule (0.4 mg total) by mouth  daily. 90 capsule 1   traMADol (ULTRAM) 50 MG tablet TAKE 1 TO 2 TABLETS BY MOUTH DAILY AS NEEDED 20 tablet 2   zolpidem (AMBIEN) 10 MG tablet TAKE ONE TABLET BY MOUTH AT BEDTIME AS NEEDED 30 tablet 2   naproxen (EC NAPROSYN) 500 MG EC tablet Take 1 tablet (500 mg total) by mouth 2 (two) times daily with a meal. (Patient not taking: Reported on 10/29/2022) 180 tablet 0   No current facility-administered medications on file prior to visit.    BP 138/78   Pulse 82   Temp 98.2 F (36.8 C) (Oral)   Ht 5' 7.5" (1.715 m)   Wt 171 lb (77.6 kg)   SpO2 97%   BMI 26.39 kg/m       Objective:   Physical Exam Vitals and nursing note reviewed.  Constitutional:      General: He is not in acute distress.    Appearance: Normal appearance. He is not ill-appearing.  HENT:     Head: Normocephalic and atraumatic.     Right Ear: Tympanic membrane, ear canal and external ear normal. There is no impacted cerumen.     Left Ear: Tympanic membrane, ear canal and external ear normal. There is no impacted cerumen.     Nose: Nose normal. No congestion or rhinorrhea.     Mouth/Throat:     Mouth: Mucous membranes are moist.     Pharynx: Oropharynx is clear.  Eyes:     Extraocular Movements: Extraocular movements intact.     Conjunctiva/sclera:  Conjunctivae normal.     Pupils: Pupils are equal, round, and reactive to light.  Neck:     Vascular: No carotid bruit.  Cardiovascular:     Rate and Rhythm: Normal rate and regular rhythm.     Pulses: Normal pulses.     Heart sounds: No murmur heard.    No friction rub. No gallop.  Pulmonary:     Effort: Pulmonary effort is normal.     Breath sounds: Normal breath sounds.  Abdominal:     General: Abdomen is flat. Bowel sounds are normal. There is no distension.     Palpations: Abdomen is soft. There is no mass.     Tenderness: There is no abdominal tenderness. There is no guarding or rebound.     Hernia: No hernia is present.  Musculoskeletal:        General: Normal range of motion.     Cervical back: Normal range of motion and neck supple.  Lymphadenopathy:     Cervical: No cervical adenopathy.  Skin:    General: Skin is warm and dry.     Capillary Refill: Capillary refill takes less than 2 seconds.  Neurological:     General: No focal deficit present.     Mental Status: He is alert and oriented to person, place, and time.  Psychiatric:        Mood and Affect: Mood normal.        Behavior: Behavior normal.        Thought Content: Thought content normal.        Judgment: Judgment normal.       Assessment & Plan:  1. Routine general medical examination at a health care facility Today patient counseled on age appropriate routine health concerns for screening and prevention, each reviewed and up to date or declined. Immunizations reviewed and up to date or declined. Labs ordered and reviewed. Risk factors for depression reviewed and negative. Hearing function and  visual acuity are intact. ADLs screened and addressed as needed. Functional ability and level of safety reviewed and appropriate. Education, counseling and referrals performed based on assessed risks today. Patient provided with a copy of personalized plan for preventive services. - Follow up in one year or sooner if  needed  2. Essential hypertension - Well controlled.  - No change in medications  - Lipid panel; Future - TSH; Future - CBC; Future - Comprehensive metabolic panel; Future  3. Anxiety - Continue with Prozac Flowsheet Row Office Visit from 10/29/2022 in [facility] [place]  PHQ-9 Total Score 0       - Lipid panel; Future - TSH; Future - CBC; Future - Comprehensive metabolic panel; Future - FLUoxetine (PROZAC) 40 MG capsule; Take 1 capsule (40 mg total) by mouth daily.  Dispense: 90 capsule; Refill: 1  4. Primary osteoarthritis involving multiple joints - Continue with Tramadol and Flexeril as needed - Lipid panel; Future - TSH; Future - CBC; Future - Comprehensive metabolic panel; Future  5. Benign prostatic hyperplasia with nocturia - Continue with Flomax  - PSA; Future - tamsulosin (FLOMAX) 0.4 MG CAPS capsule; Take 1 capsule (0.4 mg total) by mouth daily.  Dispense: 90 capsule; Refill: 3  6. Primary insomnia - Continue with Ambien  - Lipid panel; Future - TSH; Future - CBC; Future - Comprehensive metabolic panel; Future  [name], NP

## 2022-10-29 NOTE — Patient Instructions (Signed)
It was great seeing you today   We will follow up with you regarding your lab work   Please let me know if you need anything   

## 2022-11-02 ENCOUNTER — Encounter: Payer: Self-pay | Admitting: Adult Health

## 2022-11-06 MED ORDER — FLUTICASONE PROPIONATE 50 MCG/ACT NA SUSP: 2.0000 | Freq: Every day | NASAL | 2 refills | Status: DC

## 2022-11-07 NOTE — Telephone Encounter (Signed)
This has been taking care of. PT notified of update.

## 2022-11-18 DIAGNOSIS — H2513 Age-related nuclear cataract, bilateral: Secondary | ICD-10-CM | POA: Diagnosis not present

## 2022-11-18 DIAGNOSIS — H40023 Open angle with borderline findings, high risk, bilateral: Secondary | ICD-10-CM | POA: Diagnosis not present

## 2022-11-18 DIAGNOSIS — H40053 Ocular hypertension, bilateral: Secondary | ICD-10-CM | POA: Diagnosis not present

## 2022-11-18 DIAGNOSIS — H353131 Nonexudative age-related macular degeneration, bilateral, early dry stage: Secondary | ICD-10-CM | POA: Diagnosis not present

## 2022-12-23 ENCOUNTER — Encounter: Payer: Self-pay | Admitting: Adult Health

## 2022-12-31 ENCOUNTER — Encounter: Payer: Self-pay | Admitting: Adult Health

## 2023-01-01 ENCOUNTER — Other Ambulatory Visit: Payer: Self-pay | Admitting: Adult Health

## 2023-01-01 DIAGNOSIS — F5101 Primary insomnia: Secondary | ICD-10-CM

## 2023-01-01 MED ORDER — ZOLPIDEM TARTRATE 10 MG PO TABS
10.0000 mg | ORAL_TABLET | Freq: Every evening | ORAL | 2 refills | Status: DC | PRN
Start: 2023-01-01 — End: 2023-03-25

## 2023-01-01 NOTE — Telephone Encounter (Signed)
Ok to fill 

## 2023-01-07 ENCOUNTER — Other Ambulatory Visit: Payer: Self-pay | Admitting: Adult Health

## 2023-01-07 ENCOUNTER — Ambulatory Visit (INDEPENDENT_AMBULATORY_CARE_PROVIDER_SITE_OTHER): Payer: PPO | Admitting: Adult Health

## 2023-01-07 ENCOUNTER — Encounter: Payer: Self-pay | Admitting: Adult Health

## 2023-01-07 VITALS — BP 120/80 | HR 92 | Temp 98.7°F | Ht 67.5 in | Wt 174.0 lb

## 2023-01-07 DIAGNOSIS — R051 Acute cough: Secondary | ICD-10-CM | POA: Diagnosis not present

## 2023-01-07 DIAGNOSIS — J0100 Acute maxillary sinusitis, unspecified: Secondary | ICD-10-CM | POA: Diagnosis not present

## 2023-01-07 DIAGNOSIS — I10 Essential (primary) hypertension: Secondary | ICD-10-CM

## 2023-01-07 MED ORDER — DOXYCYCLINE HYCLATE 100 MG PO CAPS
100.0000 mg | ORAL_CAPSULE | Freq: Two times a day (BID) | ORAL | 0 refills | Status: DC
Start: 2023-01-07 — End: 2023-03-21

## 2023-01-07 MED ORDER — PREDNISONE 10 MG PO TABS: ORAL_TABLET | ORAL | 0 refills | Status: DC

## 2023-01-07 MED ORDER — BENZONATATE 200 MG PO CAPS
200.0000 mg | ORAL_CAPSULE | Freq: Three times a day (TID) | ORAL | 0 refills | Status: DC | PRN
Start: 2023-01-07 — End: 2023-03-21

## 2023-01-07 NOTE — Progress Notes (Signed)
Subjective:    Patient ID: Harold Cellar., male    DOB: February 15, 1946, 77 y.o.   MRN: [id number]  HPI 77 year old male who  has a past medical history of BENIGN PROSTATIC HYPERTROPHY (02/06/2007), COLONIC POLYPS, HX OF (02/06/2007), COVID (09/18/2020), DIVERTICULOSIS, COLON (02/06/2007), ERECTILE DYSFUNCTION, NON-ORGANIC, MILD (02/23/2009), HYPERLIPIDEMIA (02/22/2008), Hypertension, and OSTEOARTHRITIS (02/06/2007).  He presents to the office today for an acute issue. He reports that his symptoms started about 10 days ago. Symptoms include that of a productive cough with discolored sputum, rhinorrhea with discolored mucus and sinus pressure. He has not had any fevers or chills, chest pain, shortness of breath or wheezing. He has not been using anything at home for his symptoms.   His wife has the same symptoms    Review of Systems See HPI   Past Medical History:  Diagnosis Date   BENIGN PROSTATIC HYPERTROPHY 02/06/2007   COLONIC POLYPS, HX OF 02/06/2007   COVID 09/18/2020   per pt   DIVERTICULOSIS, COLON 02/06/2007   ERECTILE DYSFUNCTION, NON-ORGANIC, MILD 02/23/2009   HYPERLIPIDEMIA 02/22/2008   Hypertension    OSTEOARTHRITIS 02/06/2007    Social History   Socioeconomic History   Marital status: Married    Spouse name: Not on file   Number of children: 5   Years of education: Not on file   Highest education level: GED or equivalent  Occupational History   Occupation: retired  Tobacco Use   Smoking status: Former   Smokeless tobacco: Never  Advertising account planner   Vaping status: Never Used  Substance and Sexual Activity   Alcohol use: Yes    Alcohol/week: 1.0 standard drink of alcohol    Types: 1 Cans of beer per week   Drug use: No   Sexual activity: Not on file  Other Topics Concern   Not on file  Social History Narrative   Retired    Married    Social Determinants of Health   Financial Resource Strain: Low Risk  (10/24/2022)   Overall Financial Resource Strain  (CARDIA)    Difficulty of Paying Living Expenses: Not hard at all  Food Insecurity: No Food Insecurity (10/24/2022)   Hunger Vital Sign    Worried About Running Out of Food in the Last Year: Never true    Ran Out of Food in the Last Year: Never true  Transportation Needs: No Transportation Needs (06/29/2022)   PRAPARE - Administrator, Civil Service (Medical): No    Lack of Transportation (Non-Medical): No  Physical Activity: Sufficiently Active (10/24/2022)   Exercise Vital Sign    Days of Exercise per Week: 5 days    Minutes of Exercise per Session: 30 min  Stress: No Stress Concern Present (10/24/2022)   [employer] of Occupational Health - Occupational Stress Questionnaire    Feeling of Stress : Not at all  Social Connections: Unknown (06/29/2022)   Social Connection and Isolation Panel [NHANES]    Frequency of Communication with Friends and Family: Not on file    Frequency of Social Gatherings with Friends and Family: Not on file    Attends Religious Services: Not on file    Active Member of Clubs or Organizations: No    Attends Banker Meetings: Not on file    Marital Status: Married  Intimate Partner Violence: Not At Risk (10/24/2022)   Humiliation, Afraid, Rape, and Kick questionnaire    Fear of Current or Ex-Partner: No    Emotionally Abused: No  Physically Abused: No    Sexually Abused: No    Past Surgical History:  Procedure Laterality Date   CERVICAL SPINE SURGERY     ROTATOR CUFF REPAIR     TEMPOROMANDIBULAR JOINT SURGERY     TONSILLECTOMY     VASECTOMY      Family History  Problem Relation Age of Onset   COPD Mother    Heart disease Mother    Heart failure Mother    Cancer Father        lung ca    Allergies  Allergen Reactions   Nickel Rash   Sulfacetamide Sodium     Dryness of mouth     Current Outpatient Medications on File Prior to Visit  Medication Sig Dispense Refill   amLODipine (NORVASC) 10 MG tablet TAKE ONE  TABLET BY MOUTH DAILY 90 tablet 3   brimonidine (ALPHAGAN) 0.2 % ophthalmic solution      cyclobenzaprine (FLEXERIL) 5 MG tablet TAKE ONE TABLET BY MOUTH THREE TIMES A DAY AS NEEDED FOR MUSCLE SPASMS 15 tablet 0   diclofenac (VOLTAREN) 75 MG EC tablet Take 1 tablet (75 mg total) by mouth 2 (two) times daily as needed. 60 tablet 2   FLUoxetine (PROZAC) 40 MG capsule Take 1 capsule (40 mg total) by mouth daily. 90 capsule 1   fluticasone (FLONASE) 50 MCG/ACT nasal spray Place 2 sprays into both nostrils daily. 48 g 2   gabapentin (NEURONTIN) 100 MG capsule Take 1 capsule (100 mg total) by mouth 3 (three) times daily as needed. 30 capsule 2   lisinopril (ZESTRIL) 40 MG tablet Take 1 tablet (40 mg total) by mouth daily. 90 tablet 3   tadalafil (CIALIS) 5 MG tablet Take 1 tablet (5 mg total) by mouth daily. 90 tablet 3   tamsulosin (FLOMAX) 0.4 MG CAPS capsule Take 1 capsule (0.4 mg total) by mouth daily. 90 capsule 3   traMADol (ULTRAM) 50 MG tablet TAKE 1 TO 2 TABLETS BY MOUTH DAILY AS NEEDED 20 tablet 2   zolpidem (AMBIEN) 10 MG tablet Take 1 tablet (10 mg total) by mouth at bedtime as needed. 30 tablet 2   No current facility-administered medications on file prior to visit.    BP 120/80   Pulse 92   Temp 98.7 F (37.1 C) (Oral)   Ht 5' 7.5" (1.715 m)   Wt 174 lb (78.9 kg)   SpO2 95%   BMI 26.85 kg/m       Objective:   Physical Exam Vitals and nursing note reviewed.  Constitutional:      Appearance: Normal appearance.  HENT:     Nose: Congestion and rhinorrhea present. Rhinorrhea is purulent.     Right Turbinates: Enlarged and swollen.     Left Turbinates: Enlarged and swollen.     Right Sinus: Maxillary sinus tenderness present.     Left Sinus: Maxillary sinus tenderness present.  Cardiovascular:     Rate and Rhythm: Normal rate and regular rhythm.     Pulses: Normal pulses.     Heart sounds: Normal heart sounds.  Pulmonary:     Effort: Pulmonary effort is normal.      Breath sounds: Normal breath sounds.  Skin:    General: Skin is warm and dry.  Neurological:     General: No focal deficit present.     Mental Status: He is alert and oriented to person, place, and time.  Psychiatric:        Mood and Affect: Mood normal.  Behavior: Behavior normal.        Thought Content: Thought content normal.        Judgment: Judgment normal.       Assessment & Plan:  1. Acute non-recurrent maxillary sinusitis -Will treat due to symptoms and duration.  He was advised to follow-up if not improving in the next 3 to 4 days. - doxycycline (VIBRAMYCIN) 100 MG capsule; Take 1 capsule (100 mg total) by mouth 2 (two) times daily.  Dispense: 14 capsule; Refill: 0  2. Acute cough -Lungs clear on exam.  No signs of pneumonia. - predniSONE (DELTASONE) 10 MG tablet; 40 mg x 3 days, 20 mg x 3 days, 10 mg x 3 days  Dispense: 21 tablet; Refill: 0 - benzonatate (TESSALON) 200 MG capsule; Take 1 capsule (200 mg total) by mouth 3 (three) times daily as needed.  Dispense: 30 capsule; Refill: 0

## 2023-02-09 ENCOUNTER — Encounter: Payer: Self-pay | Admitting: Adult Health

## 2023-02-11 NOTE — Telephone Encounter (Signed)
Please advise 

## 2023-03-21 ENCOUNTER — Ambulatory Visit (INDEPENDENT_AMBULATORY_CARE_PROVIDER_SITE_OTHER): Payer: PPO | Admitting: Family Medicine

## 2023-03-21 VITALS — BP 90/60 | HR 100 | Temp 98.8°F | Ht 67.5 in | Wt 179.4 lb

## 2023-03-21 DIAGNOSIS — R051 Acute cough: Secondary | ICD-10-CM

## 2023-03-21 DIAGNOSIS — J101 Influenza due to other identified influenza virus with other respiratory manifestations: Secondary | ICD-10-CM | POA: Diagnosis not present

## 2023-03-21 LAB — POCT INFLUENZA A/B
Influenza A, POC: POSITIVE — AB
Influenza B, POC: NEGATIVE

## 2023-03-21 MED ORDER — OSELTAMIVIR PHOSPHATE 75 MG PO CAPS
75.0000 mg | ORAL_CAPSULE | Freq: Two times a day (BID) | ORAL | 0 refills | Status: AC
Start: 2023-03-21 — End: 2023-03-26

## 2023-03-21 MED ORDER — DM-GUAIFENESIN ER 60-1200 MG PO TB12: 1.0000 | ORAL_TABLET | Freq: Two times a day (BID) | ORAL | 0 refills | Status: DC

## 2023-03-21 NOTE — Progress Notes (Signed)
Acute Office Visit  Subjective:     Patient ID: Harold Wharff., male    DOB: 09-06-1945, 78 y.o.   MRN: [id number]  Chief Complaint  Patient presents with   Cough    Productive with yellow sputum x2 days, states he has not tried any OTC medication, states the home Covid test was negative   Chills   Fever    Temperature of 100 degrees x1 day    Pt reports severe coughing for about 2 days. Positive fever/chills, positive for body aches. Lots  of coughing, productive of yellow mucus, no ear pain, but has severe sore throat. Pt reports he was very raspy when he was breathing. COVID test was negative.     Review of Systems  All other systems reviewed and are negative.       Objective:    BP 90/60   Pulse 100   Temp 98.8 F (37.1 C) (Oral)   Ht 5' 7.5" (1.715 m)   Wt 179 lb 6.4 oz (81.4 kg)   SpO2 96%   BMI 27.68 kg/m    Physical Exam Vitals reviewed.  Constitutional:      Appearance: Normal appearance. He is normal weight.  HENT:     Right Ear: Tympanic membrane and ear canal normal.     Left Ear: Tympanic membrane and ear canal normal.     Nose: Congestion and rhinorrhea present.     Mouth/Throat:     Mouth: Mucous membranes are moist.     Pharynx: No posterior oropharyngeal erythema.  Cardiovascular:     Rate and Rhythm: Normal rate and regular rhythm.     Heart sounds: Normal heart sounds. No murmur heard. Pulmonary:     Effort: Pulmonary effort is normal.     Breath sounds: Normal breath sounds. No wheezing, rhonchi or rales.  Musculoskeletal:     Cervical back: Neck supple.  Lymphadenopathy:     Cervical: No cervical adenopathy.  Neurological:     Mental Status: He is alert and oriented to person, place, and time. Mental status is at baseline.     Results for orders placed or performed in visit on 03/21/23  POC Influenza A/B  Result Value Ref Range   Influenza A, POC Positive (A) Negative   Influenza B, POC Negative Negative         Assessment & Plan:   Problem List Items Addressed This Visit   None Visit Diagnoses       Acute cough    -  Primary   Relevant Orders   POC Influenza A/B (Completed)     Influenza A       Relevant Medications   oseltamivir (TAMIFLU) 75 MG capsule   Dextromethorphan-Guaifenesin 60-1200 MG 12hr tablet     Testing is positive for Flu A, he is within the 48 hour window to receive tamiflu, will send rx for this and mucinex DM to the pharmacy to help with his coughing. RTC PRN  Meds ordered this encounter  Medications   oseltamivir (TAMIFLU) 75 MG capsule    Sig: Take 1 capsule (75 mg total) by mouth 2 (two) times daily for 5 days.    Dispense:  10 capsule    Refill:  0   Dextromethorphan-Guaifenesin 60-1200 MG 12hr tablet    Sig: Take 1 tablet by mouth every 12 (twelve) hours.    Dispense:  60 tablet    Refill:  0    No follow-ups on file.  Britta Mccreedy  Elmer Sow, MD

## 2023-03-22 ENCOUNTER — Encounter: Payer: Self-pay | Admitting: Adult Health

## 2023-03-22 DIAGNOSIS — F5101 Primary insomnia: Secondary | ICD-10-CM

## 2023-03-25 ENCOUNTER — Other Ambulatory Visit: Payer: Self-pay | Admitting: Adult Health

## 2023-03-25 DIAGNOSIS — F5101 Primary insomnia: Secondary | ICD-10-CM

## 2023-03-25 MED ORDER — ZOLPIDEM TARTRATE 10 MG PO TABS: 10.0000 mg | ORAL_TABLET | Freq: Every evening | ORAL | 2 refills | Status: DC | PRN

## 2023-04-28 ENCOUNTER — Encounter: Payer: Self-pay | Admitting: Family Medicine

## 2023-04-28 ENCOUNTER — Ambulatory Visit (INDEPENDENT_AMBULATORY_CARE_PROVIDER_SITE_OTHER): Payer: PPO

## 2023-04-28 ENCOUNTER — Ambulatory Visit (INDEPENDENT_AMBULATORY_CARE_PROVIDER_SITE_OTHER): Payer: PPO | Admitting: Family Medicine

## 2023-04-28 VITALS — BP 124/72 | HR 90 | Temp 98.5°F | Ht 67.5 in | Wt 175.6 lb

## 2023-04-28 DIAGNOSIS — R0982 Postnasal drip: Secondary | ICD-10-CM

## 2023-04-28 DIAGNOSIS — J101 Influenza due to other identified influenza virus with other respiratory manifestations: Secondary | ICD-10-CM | POA: Diagnosis not present

## 2023-04-28 DIAGNOSIS — R062 Wheezing: Secondary | ICD-10-CM | POA: Diagnosis not present

## 2023-04-28 DIAGNOSIS — R059 Cough, unspecified: Secondary | ICD-10-CM | POA: Diagnosis not present

## 2023-04-28 DIAGNOSIS — R1110 Vomiting, unspecified: Secondary | ICD-10-CM | POA: Diagnosis not present

## 2023-04-28 DIAGNOSIS — R052 Subacute cough: Secondary | ICD-10-CM

## 2023-04-28 DIAGNOSIS — I771 Stricture of artery: Secondary | ICD-10-CM | POA: Diagnosis not present

## 2023-04-28 MED ORDER — ALBUTEROL SULFATE HFA 108 (90 BASE) MCG/ACT IN AERS: 2.0000 | INHALATION_SPRAY | Freq: Four times a day (QID) | RESPIRATORY_TRACT | 0 refills | Status: DC | PRN

## 2023-04-28 NOTE — Progress Notes (Signed)
 Established Patient Office Visit   Subjective  Patient ID: Harold Trethewey., male    DOB: [birth year]  Age: 78 y.o. MRN: [id number]  Chief Complaint  Patient presents with   Cough    Follow-up from the flu, patient is still coughing, and has some congestion, wheezing,    Shoulder Pain    Patient is asking for a left shoulder injection, rate of pain 10 out of 10,     Patient is a 78 year old male followed by Harold Frees, NP and seen for follow-up.  Patient diagnosed with influenza a on 03/21/2023.  Given Tamiflu.  Patient endorses continued cough, drainage, wheezing, and near posttussive emesis.  Patient denies sore throat, facial pain/pressure, ear pain/pressure, fever, headaches, chills.  Tried Chloraseptic spray for symptoms.    Patient Active Problem List   Diagnosis Date Noted   Neck pain 06/20/2021   Essential hypertension 01/21/2019   Insomnia 06/02/2012   ADHD (attention deficit hyperactivity disorder) 11/11/2011   ERECTILE DYSFUNCTION, NON-ORGANIC, MILD 02/23/2009   Dyslipidemia 02/22/2008   DIVERTICULOSIS, COLON 02/06/2007   BPH (benign prostatic hyperplasia) 02/06/2007   Osteoarthritis 02/06/2007   History of colonic polyps 02/06/2007   Past Medical History:  Diagnosis Date   BENIGN PROSTATIC HYPERTROPHY 02/06/2007   COLONIC POLYPS, HX OF 02/06/2007   COVID 09/18/2020   per pt   DIVERTICULOSIS, COLON 02/06/2007   ERECTILE DYSFUNCTION, NON-ORGANIC, MILD 02/23/2009   HYPERLIPIDEMIA 02/22/2008   Hypertension    OSTEOARTHRITIS 02/06/2007   Past Surgical History:  Procedure Laterality Date   CERVICAL SPINE SURGERY     ROTATOR CUFF REPAIR     TEMPOROMANDIBULAR JOINT SURGERY     TONSILLECTOMY     VASECTOMY     Social History   Tobacco Use   Smoking status: Former   Smokeless tobacco: Never  Vaping Use   Vaping status: Never Used  Substance Use Topics   Alcohol use: Yes    Alcohol/week: 1.0 standard drink of alcohol    Types: 1 Cans of beer per week    Drug use: No   Family History  Problem Relation Age of Onset   COPD Mother    Heart disease Mother    Heart failure Mother    Cancer Father        lung ca   Allergies  Allergen Reactions   Nickel Rash   Sulfacetamide Sodium     Dryness of mouth       ROS Negative unless stated above    Objective:     BP 124/72 (BP Location: Right Arm, Patient Position: Sitting, Cuff Size: Normal)   Pulse 90   Temp 98.5 F (36.9 C) (Oral)   Ht 5' 7.5" (1.715 m)   Wt 175 lb 9.6 oz (79.7 kg)   SpO2 97%   BMI 27.10 kg/m  BP Readings from Last 3 Encounters:  04/28/23 124/72  03/21/23 90/60  01/07/23 120/80   Wt Readings from Last 3 Encounters:  04/28/23 175 lb 9.6 oz (79.7 kg)  03/21/23 179 lb 6.4 oz (81.4 kg)  01/07/23 174 lb (78.9 kg)      Physical Exam Constitutional:      General: He is not in acute distress.    Appearance: Normal appearance.  HENT:     Head: Normocephalic and atraumatic.     Ears:     Comments: B/l TMs full.    Nose: Nose normal.     Mouth/Throat:     Mouth: Mucous membranes are moist.  Cardiovascular:     Rate and Rhythm: Normal rate and regular rhythm.     Heart sounds: Normal heart sounds. No murmur heard.    No gallop.  Pulmonary:     Effort: Pulmonary effort is normal. No respiratory distress.     Breath sounds: Normal breath sounds. No wheezing, rhonchi or rales.  Skin:    General: Skin is warm and dry.  Neurological:     Mental Status: He is alert and oriented to person, place, and time.     No results found for any visits on 04/28/23.    Assessment & Plan:  Subacute cough -     DG Chest 2 View; Future -     Albuterol Sulfate HFA; Inhale 2 puffs into the lungs every 6 (six) hours as needed for wheezing or shortness of breath.  Dispense: 8 g; Refill: 0  Influenza A  Wheezing -     Albuterol Sulfate HFA; Inhale 2 puffs into the lungs every 6 (six) hours as needed for wheezing or shortness of breath.  Dispense: 8 g; Refill:  0  Post-nasal drainage  Patient seen for continued cough status post influenza a viral infection on 03/22/2023.  Discussed symptoms likely postviral cough.  Postnasal drainage may also be contributing to continued coughing.  The lungs clear on exam pt endorses wheezing.  Will obtain CXR to rule out PNA, bronchitis or other etiology.  Advised Chloraseptic spray for throat sore throat.  Consider OTC cough medication as needed, warm fluids, cough drops, antihistamine such as Allegra.  Follow-up with PCP as needed.  On way to x-ray pt asks LPN if pain medication for shoulder was being sent to pharmacy.  Patient inquired if this provider did injections but did not discuss shoulder pain with this provider during visit.  OTC Voltaren gel as needed.  Follow-up with PCP.  Return if symptoms worsen or fail to improve.   Deeann Saint, MD

## 2023-05-01 ENCOUNTER — Encounter: Payer: Self-pay | Admitting: Family Medicine

## 2023-05-08 ENCOUNTER — Telehealth: Payer: Self-pay | Admitting: *Deleted

## 2023-05-08 NOTE — Telephone Encounter (Signed)
 Copied from CRM [id number]. Topic: General - Call Back - No Documentation >> May 08, 2023  2:34 PM Kathryne Eriksson wrote: Reason for CRM: Returning Office Call >> May 08, 2023  2:35 PM Kathryne Eriksson wrote: Patient is returning a missed call from Stann Ore, LPN

## 2023-05-09 NOTE — Telephone Encounter (Addendum)
 Returned patients call, Called and spoke with patient about X-Rays, patient is aware, patient has verbalized understanding

## 2023-05-13 ENCOUNTER — Encounter: Payer: Self-pay | Admitting: Adult Health

## 2023-06-20 ENCOUNTER — Encounter: Payer: Self-pay | Admitting: Adult Health

## 2023-06-22 ENCOUNTER — Other Ambulatory Visit: Payer: Self-pay | Admitting: Adult Health

## 2023-06-22 DIAGNOSIS — F5101 Primary insomnia: Secondary | ICD-10-CM

## 2023-06-23 ENCOUNTER — Encounter: Payer: Self-pay | Admitting: Adult Health

## 2023-06-24 NOTE — Telephone Encounter (Signed)
 Okay for refill?

## 2023-07-08 ENCOUNTER — Ambulatory Visit: Payer: Self-pay

## 2023-07-08 NOTE — Telephone Encounter (Signed)
 Copied from CRM [id number]. Topic: Clinical - Red Word Triage >> Jul 08, 2023 12:58 PM Chuck Crater wrote: Red Word that prompted transfer to Nurse Triage: Patient is complaining of shoulder pain. He states that it hurts really bad. *Patient did not allow me to assist when asking about an appointment or try to get more information Patient said bye and disconnected  Please call patient.

## 2023-07-08 NOTE — Telephone Encounter (Signed)
 2nd attempt, LVM requesting return call.

## 2023-07-08 NOTE — Telephone Encounter (Signed)
 3rd attempt, no answer. LVM. Will route to office for further follow up.

## 2023-07-08 NOTE — Telephone Encounter (Signed)
 This RN attempted return call #1. No answer. LVM.

## 2023-07-09 NOTE — Telephone Encounter (Signed)
 Noted.

## 2023-07-15 DIAGNOSIS — H52223 Regular astigmatism, bilateral: Secondary | ICD-10-CM | POA: Diagnosis not present

## 2023-07-15 DIAGNOSIS — H40053 Ocular hypertension, bilateral: Secondary | ICD-10-CM | POA: Diagnosis not present

## 2023-07-15 DIAGNOSIS — H524 Presbyopia: Secondary | ICD-10-CM | POA: Diagnosis not present

## 2023-07-15 DIAGNOSIS — H40023 Open angle with borderline findings, high risk, bilateral: Secondary | ICD-10-CM | POA: Diagnosis not present

## 2023-07-15 DIAGNOSIS — H353131 Nonexudative age-related macular degeneration, bilateral, early dry stage: Secondary | ICD-10-CM | POA: Diagnosis not present

## 2023-07-15 DIAGNOSIS — H5213 Myopia, bilateral: Secondary | ICD-10-CM | POA: Diagnosis not present

## 2023-07-15 DIAGNOSIS — H2513 Age-related nuclear cataract, bilateral: Secondary | ICD-10-CM | POA: Diagnosis not present

## 2023-07-18 ENCOUNTER — Other Ambulatory Visit: Payer: Self-pay | Admitting: Adult Health

## 2023-07-18 ENCOUNTER — Ambulatory Visit: Payer: Self-pay | Admitting: Adult Health

## 2023-07-18 DIAGNOSIS — F419 Anxiety disorder, unspecified: Secondary | ICD-10-CM

## 2023-07-18 DIAGNOSIS — N401 Enlarged prostate with lower urinary tract symptoms: Secondary | ICD-10-CM

## 2023-07-18 NOTE — Telephone Encounter (Signed)
 This RN made first attempt to contact patient with no answer. A voicemail was left with call back number provided.   Copied from CRM [id number]. Topic: Clinical - Red Word Triage >> Jul 18, 2023  5:09 PM Harold Wilson wrote: Red Word that prompted transfer to Nurse Triage: Patient complaining of extreme shoulder pain, can't raise arm, and having trouble sleeping.  *Patient couldn't stay on topic before going to something else/ very scattered brain in his though process *Also requesting a MRI of the shoulder *Patient said just forget it and hung up; Please call patient

## 2023-07-18 NOTE — Telephone Encounter (Signed)
 Copied from CRM [id number]. Topic: Clinical - Medication Refill >> Jul 18, 2023  4:44 PM Chuck Crater wrote: Medication: FLUoxetine  (PROZAC ) 40 MG capsule, tadalafil  (CIALIS ) 5 MG tablet, fluticasone  (FLONASE ) 50 MCG/ACT nasal spray  Has the patient contacted their pharmacy? no (Agent: If no, request that the patient contact the pharmacy for the refill. If patient does not wish to contact the pharmacy document the reason why and proceed with request.) (Agent: If yes, when and what did the pharmacy advise?)  This is the patient's preferred pharmacy:  [facility] [id number] - [place], [place] - [address] [place] [address] Phone: [phone] Fax: [phone]  [facility] [place], [place] - [address] [place] [address] [place] [address] Phone: [phone] Fax: [phone]  Is this the correct pharmacy for this prescription? Yes If no, delete pharmacy and type the correct one.   Has the prescription been filled recently? No  Is the patient out of the medication? No  Has the patient been seen for an appointment in the last year OR does the patient have an upcoming appointment? Yes  Can we respond through MyChart? yes  Agent: Please be advised that Rx refills may take up to 3 business days. We ask that you follow-up with your pharmacy.

## 2023-07-18 NOTE — Telephone Encounter (Signed)
 Attempt X3 to call patient regarding s/s noted earlier.  No response noted, left VM  Message routed to clinic appropriately

## 2023-07-18 NOTE — Telephone Encounter (Signed)
 Copied from CRM [id number]. Topic: Clinical - Medication Refill >> Jul 18, 2023  4:44 PM Chuck Crater wrote: Medication: FLUoxetine  (PROZAC ) 40 MG capsule, tadalafil  (CIALIS ) 5 MG tablet, fluticasone  (FLONASE ) 50 MCG/ACT nasal spray  Has the patient contacted their pharmacy? no (Agent: If no, request that the patient contact the pharmacy for the refill. If patient does not wish to contact the pharmacy document the reason why and proceed with request.) (Agent: If yes, when and what did the pharmacy advise?)  This is the patient's preferred pharmacy:  [facility] [id number] - [place], [place] - [address] [place] [address] Phone: [phone] Fax: [phone]  [facility] [place], [place] - [address] [place] [address] [place] [address] Phone: [phone] Fax: [phone]  Is this the correct pharmacy for this prescription? Yes If no, delete pharmacy and type the correct one.   Has the prescription been filled recently? No  Is the patient out of the medication? No  Has the patient been seen for an appointment in the last year OR does the patient have an upcoming appointment? Yes  Can we respond through MyChart? yes  Agent: Please be advised that Rx refills may take up to 3 business days. We ask that you follow-up with your pharmacy. >> Jul 18, 2023  5:02 PM Chuck Crater wrote: Patient doesn't remember the name of the other medication he needs but he said that it was for prostate.

## 2023-07-23 ENCOUNTER — Telehealth: Payer: Self-pay

## 2023-07-23 ENCOUNTER — Telehealth: Payer: Self-pay | Admitting: *Deleted

## 2023-07-23 ENCOUNTER — Other Ambulatory Visit (HOSPITAL_COMMUNITY): Payer: Self-pay

## 2023-07-23 MED ORDER — TADALAFIL 5 MG PO TABS: 5.0000 mg | ORAL_TABLET | Freq: Every day | ORAL | 3 refills | Status: AC

## 2023-07-23 MED ORDER — TAMSULOSIN HCL 0.4 MG PO CAPS: 0.4000 mg | ORAL_CAPSULE | Freq: Every day | ORAL | 3 refills | Status: DC

## 2023-07-23 MED ORDER — FLUOXETINE HCL 40 MG PO CAPS: 40.0000 mg | ORAL_CAPSULE | Freq: Every day | ORAL | 1 refills | Status: DC

## 2023-07-23 NOTE — Telephone Encounter (Signed)
 Called pt to see if sx resolved and that people were trying to get in contact with him about the issue. Pt became rude and stated that " my arm has been hurting for a month and you gave me an appointment 2 months out and that's not going to work for me thank you BYE!!!" Pt disconnected the phone while I was trying to assist. I called back to try to get pt scheduled but pt did not answer the phone. Closing note and sending to PCP as FYI.

## 2023-07-23 NOTE — Telephone Encounter (Signed)
 Copied from CRM [id number]. Topic: General - Other >> Jul 23, 2023 12:14 PM Shardie S wrote: Reason for CRM: Att: Nils Baseman- patient returning missed phone call. Request a callback.

## 2023-07-23 NOTE — Telephone Encounter (Signed)
 Pharmacy Patient Advocate Encounter   Received notification from CoverMyMeds that prior authorization for Tadalafil  5MG  tablets is required/requested.   Insurance verification completed.   The patient is insured through [facility] ADVANTAGE/RX ADVANCE .   Per test claim: PA required; PA submitted to above mentioned insurance via CoverMyMeds Key/confirmation #/EOC [id number] Status is pending

## 2023-07-23 NOTE — Telephone Encounter (Signed)
 Pharmacy Patient Advocate Encounter  Received notification from [facility] ADVANTAGE/RX ADVANCE that Prior Authorization for TADALAFIL  5MG  TABS  has been APPROVED from 07/23/23 to 07/22/24   PA #/Case ID/Reference #: [id number]

## 2023-07-23 NOTE — Telephone Encounter (Signed)
 Lm for pt to return call. Wanted to see if sx has stopped. Look like pt canceled appt.

## 2023-07-24 ENCOUNTER — Telehealth: Payer: Self-pay | Admitting: *Deleted

## 2023-07-24 NOTE — Telephone Encounter (Signed)
 Copied from CRM [id number]. Topic: Clinical - Medication Question >> Jul 24, 2023  4:59 PM Hamp Levine R wrote: Reason for CRM: Patient is upset that he needs to come in for an appointment prior to getting a medication he requested. Says he has been a patient for 30 years and this is ridiculous. Please follow up with patient.  Patient can be reached at [phone]

## 2023-07-24 NOTE — Telephone Encounter (Signed)
 Called pt multiple times. Pt needs an appt. No medication will be called in for pt. Please schedule pt if return call. Please see other phone notes.

## 2023-07-24 NOTE — Telephone Encounter (Signed)
 Called pt back multiple times no answer. If pt return call see if he would like to be schedule for issue. If not disregard call.

## 2023-07-24 NOTE — Telephone Encounter (Signed)
 Copied from CRM [id number]. Topic: Clinical - Medication Question >> Jul 24, 2023  4:40 PM Star East wrote: Reason for CRM: Patient is asking for Flexeril  for shoulder please [phone]

## 2023-07-25 NOTE — Telephone Encounter (Signed)
 Left detailed message advising pt that he will need to come in the office. I explained the need for pt to come in so that [facility] can assess him. Advised pt to return call to schedule.

## 2023-07-29 ENCOUNTER — Encounter: Admitting: Adult Health

## 2023-08-01 ENCOUNTER — Ambulatory Visit: Admitting: Adult Health

## 2023-08-01 ENCOUNTER — Encounter: Payer: Self-pay | Admitting: Adult Health

## 2023-08-01 VITALS — BP 122/80 | HR 83 | Temp 98.5°F | Wt 179.0 lb

## 2023-08-01 DIAGNOSIS — M25512 Pain in left shoulder: Secondary | ICD-10-CM | POA: Diagnosis not present

## 2023-08-01 DIAGNOSIS — G8929 Other chronic pain: Secondary | ICD-10-CM | POA: Diagnosis not present

## 2023-08-01 MED ORDER — METHYLPREDNISOLONE ACETATE 80 MG/ML IJ SUSP
80.0000 mg | Freq: Once | INTRAMUSCULAR | Status: AC
  Administered 2023-08-01: 80 mg via INTRAMUSCULAR

## 2023-08-01 NOTE — Progress Notes (Signed)
 Subjective:    Patient ID: Harold Wilson., male    DOB: [birth year], 78 y.o.   MRN: [id number]  HPI 78 year old male who  has a past medical history of BENIGN PROSTATIC HYPERTROPHY (02/06/2007), COLONIC POLYPS, HX OF (02/06/2007), COVID (09/18/2020), DIVERTICULOSIS, COLON (02/06/2007), ERECTILE DYSFUNCTION, NON-ORGANIC, MILD (02/23/2009), HYPERLIPIDEMIA (02/22/2008), Hypertension, and OSTEOARTHRITIS (02/06/2007).   He is being evaluated today for chronic left-sided shoulder pain.  An x-ray in August 2024 of the left shoulder which showed mild glenohumeral and acromioclavicular osteoarthritis.  This time we did a steroid injection which he reports relieved most of his pain until about 3 months ago when the pain came back.  He is able to raise his arm of 90 degrees but has pain with doing so and is also able to scratch his back but has pain with doing this as well.   Review of Systems See HPI   Past Medical History:  Diagnosis Date   BENIGN PROSTATIC HYPERTROPHY 02/06/2007   COLONIC POLYPS, HX OF 02/06/2007   COVID 09/18/2020   per pt   DIVERTICULOSIS, COLON 02/06/2007   ERECTILE DYSFUNCTION, NON-ORGANIC, MILD 02/23/2009   HYPERLIPIDEMIA 02/22/2008   Hypertension    OSTEOARTHRITIS 02/06/2007    Social History   Socioeconomic History   Marital status: Married    Spouse name: Not on file   Number of children: 5   Years of education: Not on file   Highest education level: GED or equivalent  Occupational History   Occupation: retired  Tobacco Use   Smoking status: Former   Smokeless tobacco: Never  Vaping Use   Vaping status: Never Used  Substance and Sexual Activity   Alcohol use: Yes    Alcohol/week: 1.0 standard drink of alcohol    Types: 1 Cans of beer per week   Drug use: No   Sexual activity: Not on file  Other Topics Concern   Not on file  Social History Narrative   Retired    Married    Social Drivers of Corporate investment banker Strain: Medium Risk  (03/21/2023)   Overall Financial Resource Strain (CARDIA)    Difficulty of Paying Living Expenses: Somewhat hard  Food Insecurity: Food Insecurity Present (03/21/2023)   Hunger Vital Sign    Worried About Running Out of Food in the Last Year: Sometimes true    Ran Out of Food in the Last Year: Sometimes true  Transportation Needs: No Transportation Needs (06/29/2022)   PRAPARE - Administrator, Civil Service (Medical): No    Lack of Transportation (Non-Medical): No  Physical Activity: Insufficiently Active (03/21/2023)   Exercise Vital Sign    Days of Exercise per Week: 5 days    Minutes of Exercise per Session: 20 min  Stress: No Stress Concern Present (03/21/2023)   [employer] of Occupational Health - Occupational Stress Questionnaire    Feeling of Stress : Only a little  Social Connections: Moderately Isolated (03/21/2023)   Social Connection and Isolation Panel [NHANES]    Frequency of Communication with Friends and Family: Twice a week    Frequency of Social Gatherings with Friends and Family: Once a week    Attends Religious Services: Never    Database administrator or Organizations: No    Attends Banker Meetings: Not on file    Marital Status: Married  Intimate Partner Violence: Not At Risk (10/24/2022)   Humiliation, Afraid, Rape, and Kick questionnaire    Fear of  Current or Ex-Partner: No    Emotionally Abused: No    Physically Abused: No    Sexually Abused: No    Past Surgical History:  Procedure Laterality Date   CERVICAL SPINE SURGERY     ROTATOR CUFF REPAIR     TEMPOROMANDIBULAR JOINT SURGERY     TONSILLECTOMY     VASECTOMY      Family History  Problem Relation Age of Onset   COPD Mother    Heart disease Mother    Heart failure Mother    Cancer Father        lung ca    Allergies  Allergen Reactions   Nickel Rash   Sulfacetamide Sodium     Dryness of mouth     Current Outpatient Medications on File Prior to Visit   Medication Sig Dispense Refill   albuterol  (VENTOLIN  HFA) 108 (90 Base) MCG/ACT inhaler Inhale 2 puffs into the lungs every 6 (six) hours as needed for wheezing or shortness of breath. 8 g 0   amLODipine  (NORVASC ) 10 MG tablet TAKE ONE TABLET BY MOUTH DAILY 90 tablet 3   brimonidine (ALPHAGAN) 0.2 % ophthalmic solution      cyclobenzaprine  (FLEXERIL ) 5 MG tablet TAKE ONE TABLET BY MOUTH THREE TIMES A DAY AS NEEDED FOR MUSCLE SPASMS 15 tablet 0   Dextromethorphan-Guaifenesin  60-1200 MG 12hr tablet Take 1 tablet by mouth every 12 (twelve) hours. 60 tablet 0   diclofenac  (VOLTAREN ) 75 MG EC tablet Take 1 tablet (75 mg total) by mouth 2 (two) times daily as needed. 60 tablet 2   FLUoxetine  (PROZAC ) 40 MG capsule Take 1 capsule (40 mg total) by mouth daily. 90 capsule 1   fluticasone  (FLONASE ) 50 MCG/ACT nasal spray Place 2 sprays into both nostrils daily. 48 g 2   gabapentin  (NEURONTIN ) 100 MG capsule Take 1 capsule (100 mg total) by mouth 3 (three) times daily as needed. 30 capsule 2   lisinopril  (ZESTRIL ) 40 MG tablet TAKE 1 TABLET BY MOUTH DAILY 90 tablet 3   tadalafil  (CIALIS ) 5 MG tablet Take 1 tablet (5 mg total) by mouth daily. 90 tablet 3   tamsulosin  (FLOMAX ) 0.4 MG CAPS capsule Take 1 capsule (0.4 mg total) by mouth daily. 90 capsule 3   traMADol  (ULTRAM ) 50 MG tablet TAKE 1 TO 2 TABLETS BY MOUTH DAILY AS NEEDED 20 tablet 2   zolpidem  (AMBIEN ) 10 MG tablet TAKE ONE TABLET BY MOUTH AT BEDTIME AS NEEDED 30 tablet 2   No current facility-administered medications on file prior to visit.    BP 122/80   Pulse 83   Temp 98.5 F (36.9 C) (Oral)   Wt 179 lb (81.2 kg)   SpO2 95%   BMI 27.62 kg/m       Objective:   Physical Exam Vitals and nursing note reviewed.  Constitutional:      Appearance: Normal appearance.  Musculoskeletal:     Right shoulder: Normal.     Left shoulder: Bony tenderness and crepitus present. No swelling or deformity. Decreased range of motion. Normal  strength. Normal pulse.  Skin:    General: Skin is warm and dry.  Neurological:     General: No focal deficit present.     Mental Status: He is alert and oriented to person, place, and time.  Psychiatric:        Mood and Affect: Mood normal.        Behavior: Behavior normal.        Thought Content: Thought  content normal.        Judgment: Judgment normal.           Assessment & Plan:  1. Chronic left shoulder pain (Primary) - He would like a steroid injection today to help get him out of pain. We will also order an MRI due to constant nature of pain  Shoulder injection Verbal consent obtained and verified. Sterile betadine prep. Furthur cleansed with alcohol. Topical analgesic spray: Ethyl chloride. Joint: left subacromial injection Approached in typical fashion with: posterior approach Completed without difficulty Meds: 3 cc lidocaine  2% no epi, 1 cc depomedrol 80mg /cc Needle:1.5 inch 25 gauge Aftercare instructions and Red flags advised. Immediate improvement in pain noted  - methylPREDNISolone  acetate (DEPO-MEDROL ) injection 80 mg - MR SHOULDER LEFT WO CONTRAST; Future  Alto Atta, NP

## 2023-08-04 ENCOUNTER — Other Ambulatory Visit: Payer: Self-pay | Admitting: Adult Health

## 2023-08-04 DIAGNOSIS — S00259A Superficial foreign body of unspecified eyelid and periocular area, initial encounter: Secondary | ICD-10-CM

## 2023-08-05 ENCOUNTER — Other Ambulatory Visit: Payer: Self-pay | Admitting: Cardiovascular Disease

## 2023-08-06 ENCOUNTER — Telehealth: Payer: Self-pay

## 2023-08-06 NOTE — Telephone Encounter (Signed)
 Called pt back unable to understand what pt was saying. I asked pt if spouse as available but he just said "sleep". I advised pt that I will call back in a sec. Spoke with provider and he will give pt a call.

## 2023-08-06 NOTE — Telephone Encounter (Signed)
 Copied from CRM [id number]. Topic: General - Other >> Aug 06, 2023 12:40 PM Chasity T wrote: Reason for CRM: Patient is calling in requesting for Dr Annemarie Barry nurse to give him a call.

## 2023-08-06 NOTE — Telephone Encounter (Signed)
 Called pt spouse and she stated pt is okay. She asked pt what it is he was needing and pt stated "nevermind". She asked pt repeatedly and he stopped talking. I advised if he changes his mind to call us  back.

## 2023-08-07 ENCOUNTER — Ambulatory Visit
Admission: RE | Admit: 2023-08-07 | Discharge: 2023-08-07 | Disposition: A | Discharge status: Home / Self Care | Source: Ambulatory Visit | Attending: Adult Health

## 2023-08-07 DIAGNOSIS — Z03823 Encounter for observation for suspected inserted (injected) foreign body ruled out: Secondary | ICD-10-CM | POA: Diagnosis not present

## 2023-08-07 DIAGNOSIS — S00259A Superficial foreign body of unspecified eyelid and periocular area, initial encounter: Secondary | ICD-10-CM

## 2023-08-09 ENCOUNTER — Ambulatory Visit
Admission: RE | Admit: 2023-08-09 | Discharge: 2023-08-09 | Disposition: A | Discharge status: Home / Self Care | Source: Ambulatory Visit | Attending: Adult Health | Admitting: Adult Health

## 2023-08-09 DIAGNOSIS — M25512 Pain in left shoulder: Secondary | ICD-10-CM | POA: Diagnosis not present

## 2023-08-09 DIAGNOSIS — G8929 Other chronic pain: Secondary | ICD-10-CM | POA: Diagnosis not present

## 2023-08-09 DIAGNOSIS — M75122 Complete rotator cuff tear or rupture of left shoulder, not specified as traumatic: Secondary | ICD-10-CM | POA: Diagnosis not present

## 2023-08-13 IMAGING — MR MR CERVICAL SPINE W/O CM
5 series · 29 of 48 positions shown · non-contrast
Comparison: Cervical radiographs June 20, 2020.

CLINICAL DATA: NECK PAIN

EXAM:
MRI CERVICAL SPINE WITHOUT CONTRAST
TECHNIQUE: Multiplanar, multisequence MR imaging of the cervical spine was
performed. No intravenous contrast was administered.

[Series 2: T1 · sagittal · 3.0mm · 0.41mm/px · 6 of 20 slices shown]
[im 1/20]
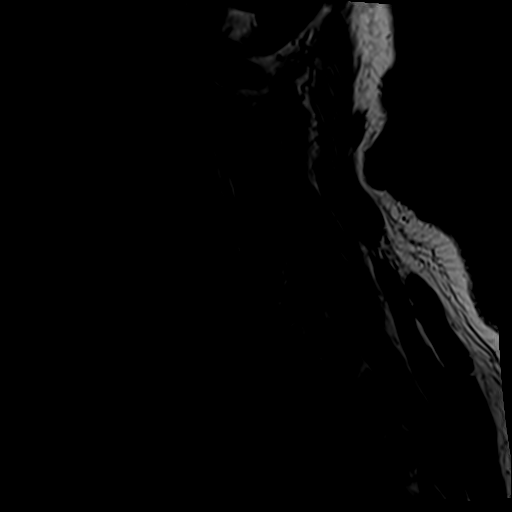
[im 4/20]
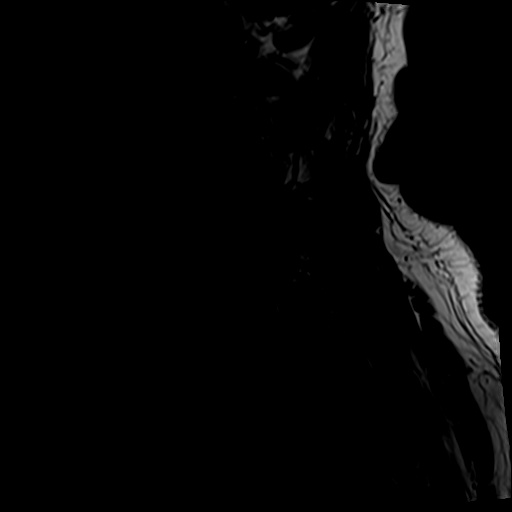
[im 8/20]
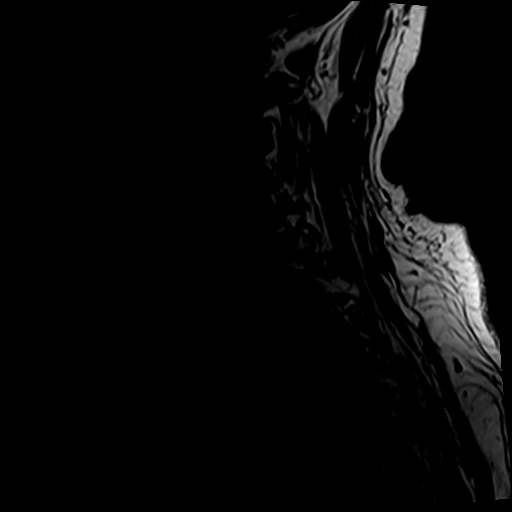
[im 12/20]
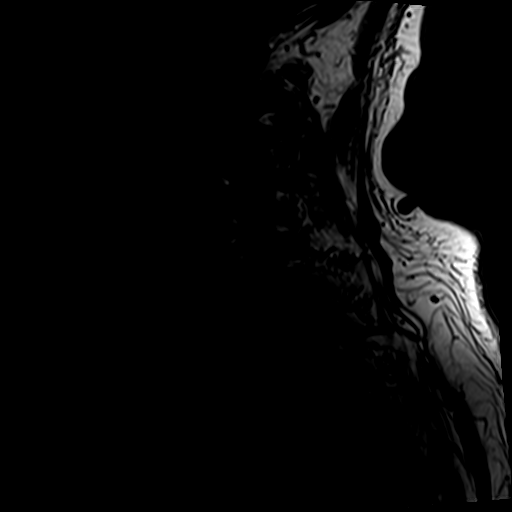
[im 16/20]
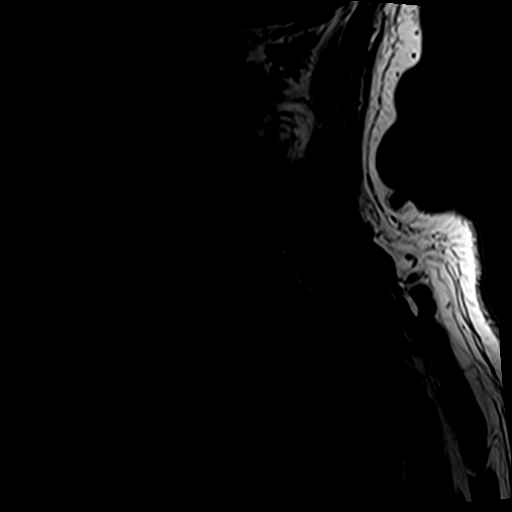
[im 20/20]
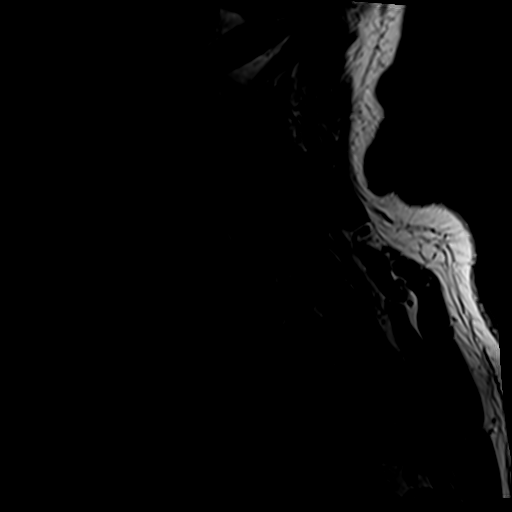

[Series 3: T2 · sagittal · 3.0mm · 0.82mm/px · 7 of 20 slices shown (1 of 2)]
[im 1/20]
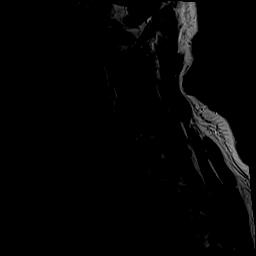
[im 4/20]
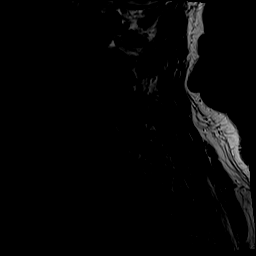
[im 7/20]
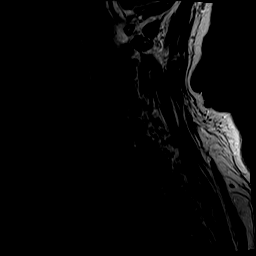
[im 10/20]
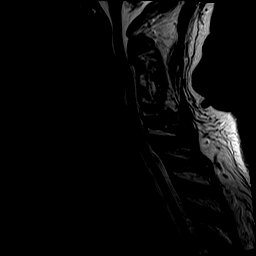
[im 13/20]
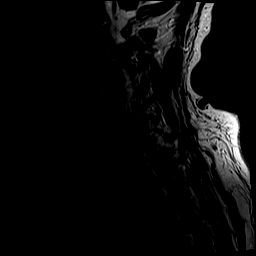
[im 16/20]
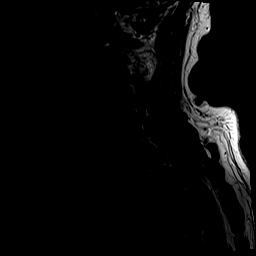
[im 20/20]
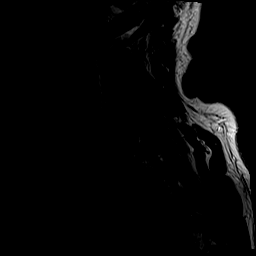

[Series 4: tir sag · sagittal · 3.0mm · 0.41mm/px · 7 of 20 slices shown]
[im 1/20]
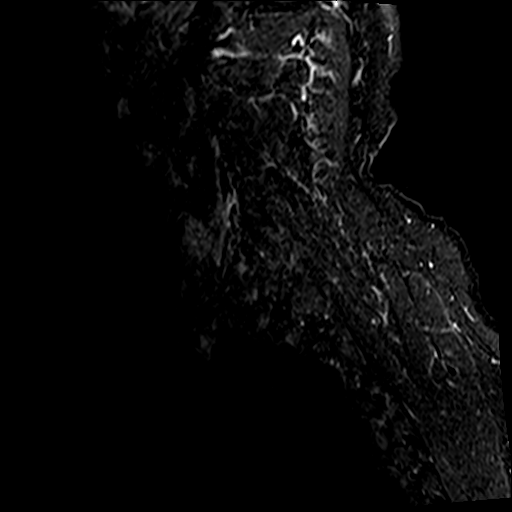
[im 4/20]
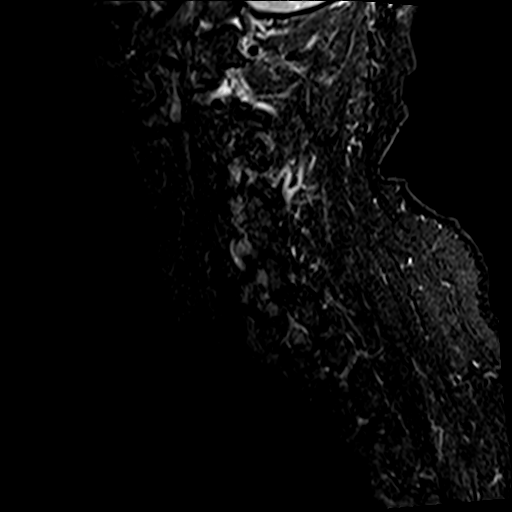
[im 7/20]
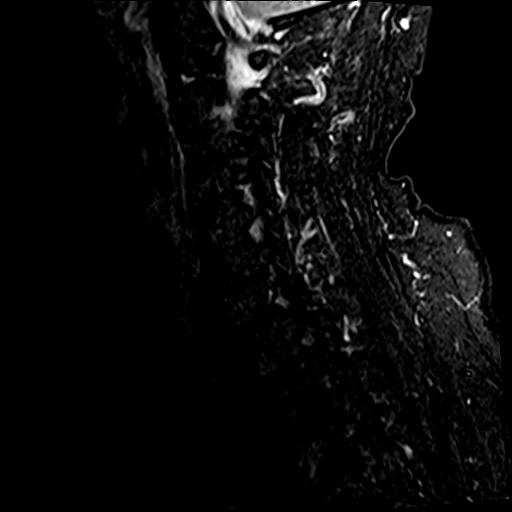
[im 10/20]
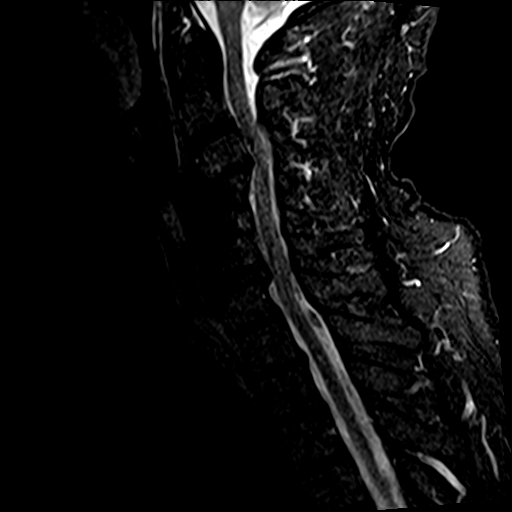
[im 13/20]
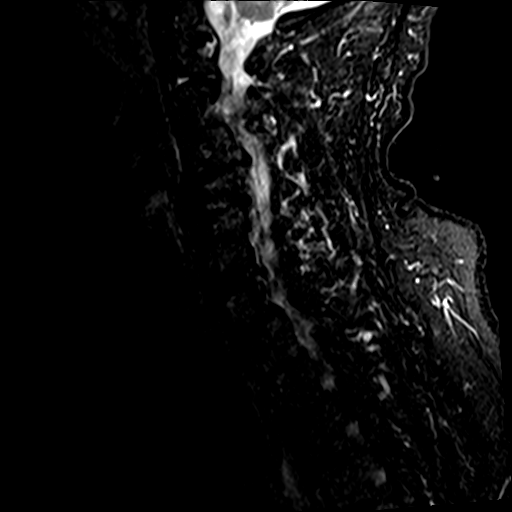
[im 16/20]
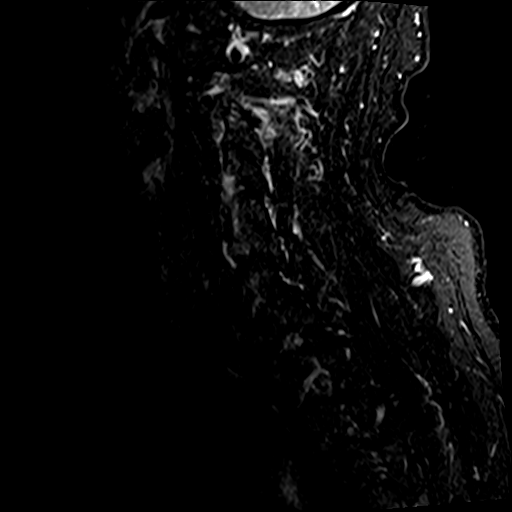
[im 20/20]
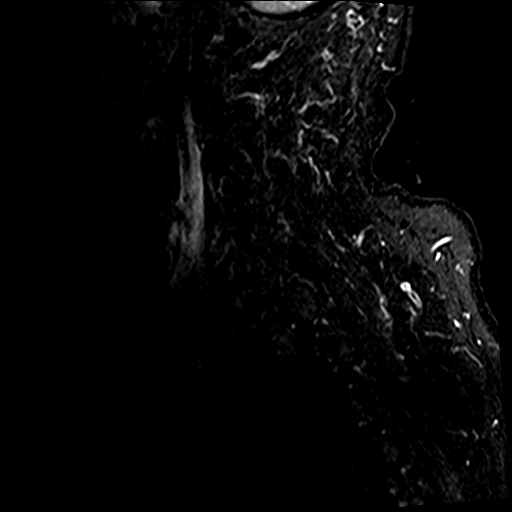

[Series 5: GRE · axial · 3.0mm · 0.39mm/px · 1 of 43 slices shown]
[im 1/43]
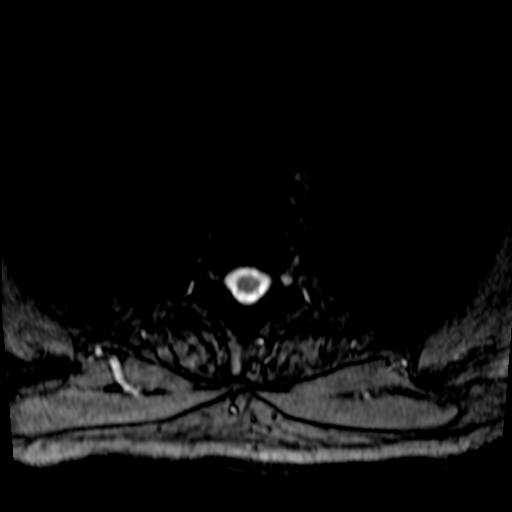

[Series 6: T2 · axial · 3.0mm · 0.78mm/px · z∈[-63,+93]mm · 8 of 43 slices shown (2 of 2)]
[im 1/43]
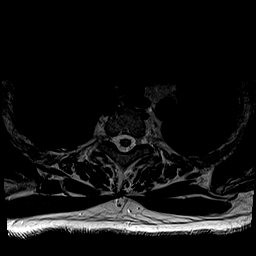
[im 7/43]
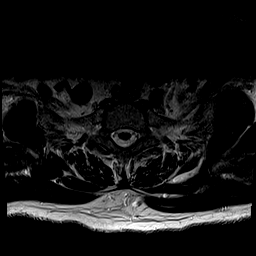
[im 13/43]
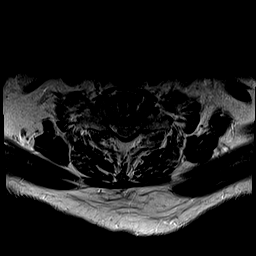
[im 20/43]
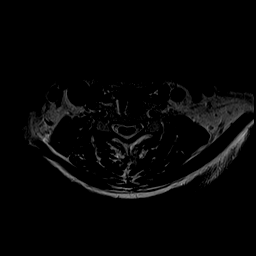
[im 23/43]
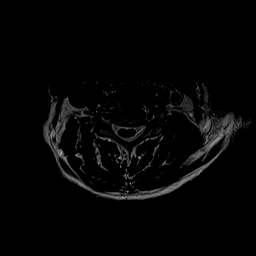
[im 30/43]
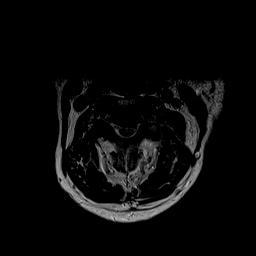
[im 36/43]
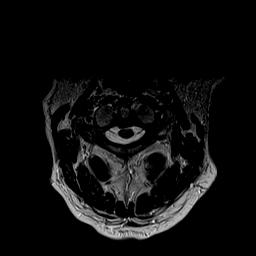
[im 43/43]
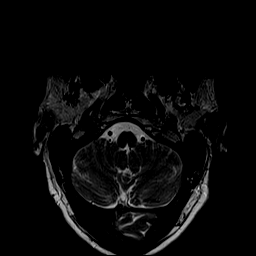

[29 of 48 positions shown; findings below may reference images not displayed]

FINDINGS: Alignment: Similar mild anterolisthesis of C2 on C3 and mild
retrolisthesis of C3 on C4.

Vertebrae: C4-C6 ACDF. No focal marrow edema to suggest acute
fracture or discitis/osteomyelitis. No suspicious bone lesions.

Cord: Normal cord signal.  Stroke

Posterior Fossa, vertebral arteries, paraspinal tissues: Visualized
vertebral artery flow voids are maintained. No evidence of acute
abnormality in the visualized inferior posterior fossa. No
paraspinal edema.

Disc levels:

C2-C3: Left facet arthropathy with mild left foraminal stenosis.
Posterior disc osteophyte complex and ligamentum flavum thickening
with mild to moderate canal stenosis.

C3-C4: Posterior disc osteophyte complex with bilateral facet and
uncovertebral hypertrophy. Resulting moderate to severe bilateral
foraminal stenosis. Mild canal stenosis.

C4-C5: ACDF. Left greater than right facet uncovertebral
hypertrophy. Resulting moderate left and mild right foraminal
stenosis. Mild canal stenosis.

C5-C6: ACDF endplate spurring with left greater than right facet and
uncovertebral hypertrophy. Resulting moderate left foraminal
stenosis. Mild canal stenosis.

C6-C7: Posterior disc osteophyte complex with bilateral facet and
uncovertebral hypertrophy. Resulting moderate to severe left and
moderate right foraminal stenosis. Mild to moderate canal stenosis.

C7-T1: Posterior disc osteophyte complex with bilateral facet and
uncovertebral hypertrophy. Resulting moderate left and mild to
moderate right foraminal stenosis. Patent canal.
IMPRESSION: 1. At C3-C4, moderate to severe right greater than left foraminal
stenosis with mild canal stenosis.
2. At C6-C7, moderate to severe left and moderate right foraminal
stenosis with mild to moderate canal stenosis.
3. At C7-T1, moderate left and mild right foraminal stenosis.
4. At C4-C5 and C5-C6, ACDF with mild canal stenosis and moderate
left foraminal stenosis.
5. At C2-C3, mild to moderate canal stenosis and mild left foraminal
stenosis.

## 2023-08-19 ENCOUNTER — Encounter: Payer: Self-pay | Admitting: Adult Health

## 2023-08-19 ENCOUNTER — Ambulatory Visit: Payer: Self-pay | Admitting: Adult Health

## 2023-08-19 NOTE — Progress Notes (Signed)
 Called pt to see if he needed anything before his appt. And pt started yelling. No further action needed.

## 2023-08-19 NOTE — Telephone Encounter (Signed)
 Called pt but no answer. Looks like pt was scheduled.

## 2023-08-19 NOTE — Telephone Encounter (Signed)
 Copied from CRM [id number]. Topic: Clinical - Medical Advice >> Aug 18, 2023  4:03 PM Albertha Alosa wrote: Reason for CRM: Patient called in regarding not feeling well, stated he has congestion and coughing up mucus , would like for NP [facility] or nurses to give him a callback on what he should take OTC

## 2023-08-20 ENCOUNTER — Encounter: Payer: Self-pay | Admitting: Adult Health

## 2023-08-20 ENCOUNTER — Ambulatory Visit (INDEPENDENT_AMBULATORY_CARE_PROVIDER_SITE_OTHER): Admitting: Adult Health

## 2023-08-20 ENCOUNTER — Ambulatory Visit (INDEPENDENT_AMBULATORY_CARE_PROVIDER_SITE_OTHER)

## 2023-08-20 VITALS — BP 120/60 | HR 101 | Temp 99.1°F | Ht 67.0 in | Wt 171.3 lb

## 2023-08-20 DIAGNOSIS — Z0389 Encounter for observation for other suspected diseases and conditions ruled out: Secondary | ICD-10-CM | POA: Diagnosis not present

## 2023-08-20 DIAGNOSIS — R052 Subacute cough: Secondary | ICD-10-CM | POA: Diagnosis not present

## 2023-08-20 DIAGNOSIS — M25512 Pain in left shoulder: Secondary | ICD-10-CM

## 2023-08-20 DIAGNOSIS — F1010 Alcohol abuse, uncomplicated: Secondary | ICD-10-CM

## 2023-08-20 DIAGNOSIS — J988 Other specified respiratory disorders: Secondary | ICD-10-CM

## 2023-08-20 DIAGNOSIS — R55 Syncope and collapse: Secondary | ICD-10-CM | POA: Diagnosis not present

## 2023-08-20 DIAGNOSIS — R062 Wheezing: Secondary | ICD-10-CM

## 2023-08-20 DIAGNOSIS — G8929 Other chronic pain: Secondary | ICD-10-CM | POA: Diagnosis not present

## 2023-08-20 MED ORDER — BENZONATATE 200 MG PO CAPS: 200.0000 mg | ORAL_CAPSULE | Freq: Three times a day (TID) | ORAL | 0 refills | Status: DC | PRN

## 2023-08-20 MED ORDER — ALBUTEROL SULFATE HFA 108 (90 BASE) MCG/ACT IN AERS: 2.0000 | INHALATION_SPRAY | Freq: Four times a day (QID) | RESPIRATORY_TRACT | 0 refills | Status: AC | PRN

## 2023-08-20 MED ORDER — PREDNISONE 10 MG PO TABS
10.0000 mg | ORAL_TABLET | Freq: Every day | ORAL | 0 refills | Status: DC
Start: 2023-08-20 — End: 2023-11-04

## 2023-08-20 MED ORDER — DOXYCYCLINE HYCLATE 100 MG PO CAPS: 100.0000 mg | ORAL_CAPSULE | Freq: Two times a day (BID) | ORAL | 0 refills | Status: DC

## 2023-08-20 NOTE — Patient Instructions (Signed)
 I am going to treat you for a respiratory infection with an antibiotic called Doxycyline. I have also sent in tessalon  pearls for the cough and some steroids.   We will do a chest xray today   Please drink plenty of water and cut out drinking alcohol

## 2023-08-20 NOTE — Progress Notes (Signed)
 Subjective:    Patient ID: Harold Hammed., male    DOB: May 24, 1945, 78 y.o.   MRN: [id number]  HPI 78 year old male who is being evaluated today for multiple issues.  He reports that about a week ago he started to feel ill, has had nasal congestion, productive cough with discolored sputum, chills, and wheezing.  He has not had any shortness of breath.  He also reports that yesterday he had some vomiting yesterday and has felt dizzy.  He was walking down his hallway at his home became dizzy and passed out.  He fell backwards and hit his head, likely has some LOC.  He denies headaches or blurred vision and does not feel as though he has any injuries from the fall.  He does report that about 2 days prior to falling he had not had any alcohol and prior to that he usually had to 2 3 mixed drinks a day.  He has not been staying hydrated with anything besides soda.  He has not had any chest pain.  He also recently had an MRI of the left shoulder for left shoulder pain which showed multiple tendon tears in the shoulder as well as the left bicep.  We will refer him to orthopedics     Review of Systems See HPI   Past Medical History:  Diagnosis Date   BENIGN PROSTATIC HYPERTROPHY 02/06/2007   COLONIC POLYPS, HX OF 02/06/2007   COVID 09/18/2020   per pt   DIVERTICULOSIS, COLON 02/06/2007   ERECTILE DYSFUNCTION, NON-ORGANIC, MILD 02/23/2009   HYPERLIPIDEMIA 02/22/2008   Hypertension    OSTEOARTHRITIS 02/06/2007    Social History   Socioeconomic History   Marital status: Married    Spouse name: Not on file   Number of children: 5   Years of education: Not on file   Highest education level: GED or equivalent  Occupational History   Occupation: retired  Tobacco Use   Smoking status: Former   Smokeless tobacco: Never  Advertising account planner   Vaping status: Never Used  Substance and Sexual Activity   Alcohol use: Yes    Alcohol/week: 1.0 standard drink of alcohol    Types: 1 Cans of beer  per week   Drug use: No   Sexual activity: Not on file  Other Topics Concern   Not on file  Social History Narrative   Retired    Married    Social Drivers of Corporate investment banker Strain: Medium Risk (08/19/2023)   Overall Financial Resource Strain (CARDIA)    Difficulty of Paying Living Expenses: Somewhat hard  Food Insecurity: No Food Insecurity (08/19/2023)   Hunger Vital Sign    Worried About Running Out of Food in the Last Year: Never true    Ran Out of Food in the Last Year: Never true  Transportation Needs: No Transportation Needs (08/19/2023)   PRAPARE - Administrator, Civil Service (Medical): No    Lack of Transportation (Non-Medical): No  Physical Activity: Sufficiently Active (08/19/2023)   Exercise Vital Sign    Days of Exercise per Week: 7 days    Minutes of Exercise per Session: 30 min  Stress: Stress Concern Present (08/19/2023)   [employer] of Occupational Health - Occupational Stress Questionnaire    Feeling of Stress: Rather much  Social Connections: Moderately Isolated (08/19/2023)   Social Connection and Isolation Panel    Frequency of Communication with Friends and Family: More than three times a week  Frequency of Social Gatherings with Friends and Family: More than three times a week    Attends Religious Services: Never    Database administrator or Organizations: No    Attends Engineer, structural: Not on file    Marital Status: Married  Catering manager Violence: Not At Risk (10/24/2022)   Humiliation, Afraid, Rape, and Kick questionnaire    Fear of Current or Ex-Partner: No    Emotionally Abused: No    Physically Abused: No    Sexually Abused: No    Past Surgical History:  Procedure Laterality Date   CERVICAL SPINE SURGERY     ROTATOR CUFF REPAIR     TEMPOROMANDIBULAR JOINT SURGERY     TONSILLECTOMY     VASECTOMY      Family History  Problem Relation Age of Onset   COPD Mother    Heart disease Mother     Heart failure Mother    Cancer Father        lung ca    Allergies  Allergen Reactions   Nickel Rash   Sulfacetamide Sodium     Dryness of mouth     Current Outpatient Medications on File Prior to Visit  Medication Sig Dispense Refill   albuterol  (VENTOLIN  HFA) 108 (90 Base) MCG/ACT inhaler Inhale 2 puffs into the lungs every 6 (six) hours as needed for wheezing or shortness of breath. 8 g 0   amLODipine  (NORVASC ) 10 MG tablet TAKE 1 TABLET BY MOUTH DAILY 30 tablet 0   brimonidine (ALPHAGAN) 0.2 % ophthalmic solution      cyclobenzaprine  (FLEXERIL ) 5 MG tablet TAKE ONE TABLET BY MOUTH THREE TIMES A DAY AS NEEDED FOR MUSCLE SPASMS 15 tablet 0   Dextromethorphan-Guaifenesin  60-1200 MG 12hr tablet Take 1 tablet by mouth every 12 (twelve) hours. 60 tablet 0   diclofenac  (VOLTAREN ) 75 MG EC tablet Take 1 tablet (75 mg total) by mouth 2 (two) times daily as needed. 60 tablet 2   FLUoxetine  (PROZAC ) 40 MG capsule Take 1 capsule (40 mg total) by mouth daily. 90 capsule 1   fluticasone  (FLONASE ) 50 MCG/ACT nasal spray Place 2 sprays into both nostrils daily. 48 g 2   gabapentin  (NEURONTIN ) 100 MG capsule Take 1 capsule (100 mg total) by mouth 3 (three) times daily as needed. 30 capsule 2   lisinopril  (ZESTRIL ) 40 MG tablet TAKE 1 TABLET BY MOUTH DAILY 90 tablet 3   tadalafil  (CIALIS ) 5 MG tablet Take 1 tablet (5 mg total) by mouth daily. 90 tablet 3   tamsulosin  (FLOMAX ) 0.4 MG CAPS capsule Take 1 capsule (0.4 mg total) by mouth daily. 90 capsule 3   traMADol  (ULTRAM ) 50 MG tablet TAKE 1 TO 2 TABLETS BY MOUTH DAILY AS NEEDED 20 tablet 2   zolpidem  (AMBIEN ) 10 MG tablet TAKE ONE TABLET BY MOUTH AT BEDTIME AS NEEDED 30 tablet 2   No current facility-administered medications on file prior to visit.    BP 120/60 (BP Location: Left Arm, Patient Position: Sitting)   Pulse (!) 101   Temp 99.1 F (37.3 C) (Oral)   Ht 5' 7 (1.702 m)   Wt 171 lb 4.8 oz (77.7 kg)   BMI 26.83 kg/m        Objective:   Physical Exam Vitals and nursing note reviewed.  Constitutional:      Appearance: Normal appearance. He is ill-appearing.  HENT:     Nose: Congestion and rhinorrhea present.     Mouth/Throat:  Mouth: Mucous membranes are moist.     Pharynx: Oropharynx is clear.   Cardiovascular:     Rate and Rhythm: Normal rate and regular rhythm.     Pulses: Normal pulses.     Heart sounds: Normal heart sounds.  Pulmonary:     Breath sounds: Decreased air movement present. Examination of the right-middle field reveals wheezing. Examination of the right-lower field reveals wheezing and rhonchi. Examination of the left-lower field reveals rhonchi. Wheezing and rhonchi present.   Musculoskeletal:        General: Tenderness present.     Left shoulder: Bony tenderness present. Decreased range of motion. Decreased strength.   Neurological:     General: No focal deficit present.     Mental Status: He is alert and oriented to person, place, and time.     Cranial Nerves: Cranial nerves 2-12 are intact.     Sensory: Sensation is intact.     Motor: Motor function is intact.     Coordination: Coordination is intact.     Gait: Gait is intact.   Psychiatric:        Mood and Affect: Mood normal.        Behavior: Behavior normal.        Thought Content: Thought content normal.        Judgment: Judgment normal.       Assessment & Plan:  1. Respiratory infection (Primary) - There is concern for pneumonia, will cover with doxycycline  for pneumonia and sinusitis.  Will get x-ray today and prescribed albuterol  and prednisone . - doxycycline  (VIBRAMYCIN ) 100 MG capsule; Take 1 capsule (100 mg total) by mouth 2 (two) times daily.  Dispense: 20 capsule; Refill: 0 - DG Chest 2 View; Future - predniSONE  (DELTASONE ) 10 MG tablet; Take 1 tablet (10 mg total) by mouth daily with breakfast.  Dispense: 5 tablet; Refill: 0 - benzonatate  (TESSALON ) 200 MG capsule; Take 1 capsule (200 mg total) by mouth  3 (three) times daily as needed.  Dispense: 30 capsule; Refill: 0  2. Syncope, unspecified syncope type  - EKG 12-Lead- NSR, Rate 88 - consistent with previous EKGS  3. Subacute cough  - albuterol  (VENTOLIN  HFA) 108 (90 Base) MCG/ACT inhaler; Inhale 2 puffs into the lungs every 6 (six) hours as needed for wheezing or shortness of breath.  Dispense: 8 g; Refill: 0  4. Wheezing  - albuterol  (VENTOLIN  HFA) 108 (90 Base) MCG/ACT inhaler; Inhale 2 puffs into the lungs every 6 (six) hours as needed for wheezing or shortness of breath.  Dispense: 8 g; Refill: 0  5. Chronic left shoulder pain  - AMB referral to orthopedics  6. Alcohol abuse - Does not appear to be going through DT's.  - Encouraged alcohol cessation  - Increase water for hydration   Alto Atta, NP

## 2023-08-21 ENCOUNTER — Other Ambulatory Visit

## 2023-08-21 ENCOUNTER — Ambulatory Visit: Payer: Self-pay | Admitting: Adult Health

## 2023-08-25 ENCOUNTER — Encounter: Payer: Self-pay | Admitting: Adult Health

## 2023-08-26 ENCOUNTER — Other Ambulatory Visit: Payer: Self-pay | Admitting: Adult Health

## 2023-08-26 MED ORDER — HYDROCODONE BIT-HOMATROP MBR 5-1.5 MG/5ML PO SOLN
5.0000 mL | Freq: Three times a day (TID) | ORAL | 0 refills | Status: DC | PRN
Start: 2023-08-26 — End: 2023-11-04

## 2023-08-26 NOTE — Telephone Encounter (Signed)
**Note De-identified  [place] Obfuscation** Please advise 

## 2023-08-28 ENCOUNTER — Telehealth: Payer: Self-pay | Admitting: *Deleted

## 2023-08-28 NOTE — Telephone Encounter (Signed)
 Reason for CRM: Patient called, says he's returning Kendra's phone call and doesn't know what she wants

## 2023-09-06 ENCOUNTER — Other Ambulatory Visit: Payer: Self-pay | Admitting: Cardiovascular Disease

## 2023-09-07 ENCOUNTER — Other Ambulatory Visit: Payer: Self-pay | Admitting: Adult Health

## 2023-09-07 DIAGNOSIS — F5101 Primary insomnia: Secondary | ICD-10-CM

## 2023-09-08 ENCOUNTER — Other Ambulatory Visit: Payer: Self-pay | Admitting: Adult Health

## 2023-09-08 DIAGNOSIS — F5101 Primary insomnia: Secondary | ICD-10-CM

## 2023-09-08 NOTE — Telephone Encounter (Unsigned)
 Copied from CRM [id number]. Topic: Clinical - Medication Refill >> Sep 08, 2023 11:52 AM Tiffini S wrote: Medication: zolpidem  (AMBIEN ) 10 MG tablet   Has the patient contacted their pharmacy? Yes, send the request in [facility] for a medication refill, MyCHART states patient has two refills  (Agent: If no, request that the patient contact the pharmacy for the refill. If patient does not wish to contact the pharmacy document the reason why and proceed with request.) (Agent: If yes, when and what did the pharmacy advise?)  This is the patient's preferred pharmacy:  [facility] [id number] - [place], [place] - [address] [place] [address] Phone: [phone] Fax: [phone]  Is this the correct pharmacy for this prescription? Yes If no, delete pharmacy and type the correct one.   Has the prescription been filled recently? Yes  Is the patient out of the medication? Yes  Has the patient been seen for an appointment in the last year OR does the patient have an upcoming appointment? Yes  Can we respond through MyChart? Yes  Agent: Please be advised that Rx refills may take up to 3 business days. We ask that you follow-up with your pharmacy.

## 2023-09-08 NOTE — Telephone Encounter (Signed)
 Copied from CRM [id number]. Topic: Clinical - Medication Refill >> Sep 08, 2023 11:52 AM Tiffini S wrote: Medication: zolpidem  (AMBIEN ) 10 MG tablet   Has the patient contacted their pharmacy? Yes, send the request in [facility] for a medication refill, MyCHART states patient has two refills  (Agent: If no, request that the patient contact the pharmacy for the refill. If patient does not wish to contact the pharmacy document the reason why and proceed with request.) (Agent: If yes, when and what did the pharmacy advise?)  This is the patient's preferred pharmacy:  [facility] [id number] - [place], [place] - [address] [place] [address] Phone: [phone] Fax: [phone]  Is this the correct pharmacy for this prescription? Yes If no, delete pharmacy and type the correct one.   Has the prescription been filled recently? Yes  Is the patient out of the medication? Yes  Has the patient been seen for an appointment in the last year OR does the patient have an upcoming appointment? Yes  Can we respond through MyChart? Yes  Agent: Please be advised that Rx refills may take up to 3 business days. We ask that you follow-up with your pharmacy. >> Sep 08, 2023 12:06 PM Tiffini S wrote: Patient spouse states that the last refill was 30 tablets on 04/25/23.

## 2023-09-09 ENCOUNTER — Other Ambulatory Visit: Payer: Self-pay | Admitting: Adult Health

## 2023-09-09 NOTE — Telephone Encounter (Signed)
 Okay for refill?

## 2023-09-16 ENCOUNTER — Other Ambulatory Visit: Payer: Self-pay | Admitting: Adult Health

## 2023-09-16 DIAGNOSIS — F5101 Primary insomnia: Secondary | ICD-10-CM

## 2023-09-16 MED ORDER — ZOLPIDEM TARTRATE 10 MG PO TABS: 10.0000 mg | ORAL_TABLET | Freq: Every evening | ORAL | 2 refills | Status: DC | PRN
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
## 2023-09-23 ENCOUNTER — Ambulatory Visit: Admitting: Physician Assistant

## 2023-09-23 ENCOUNTER — Ambulatory Visit (INDEPENDENT_AMBULATORY_CARE_PROVIDER_SITE_OTHER): Admitting: Sports Medicine

## 2023-09-23 ENCOUNTER — Other Ambulatory Visit: Payer: Self-pay

## 2023-09-23 ENCOUNTER — Encounter: Payer: Self-pay | Admitting: Physician Assistant

## 2023-09-23 ENCOUNTER — Encounter: Payer: Self-pay | Admitting: Sports Medicine

## 2023-09-23 DIAGNOSIS — M75122 Complete rotator cuff tear or rupture of left shoulder, not specified as traumatic: Secondary | ICD-10-CM | POA: Diagnosis not present

## 2023-09-23 DIAGNOSIS — G8929 Other chronic pain: Secondary | ICD-10-CM | POA: Diagnosis not present

## 2023-09-23 DIAGNOSIS — M25512 Pain in left shoulder: Secondary | ICD-10-CM | POA: Diagnosis not present

## 2023-09-23 MED ORDER — BUPIVACAINE HCL 0.25 % IJ SOLN
2.0000 mL | INTRAMUSCULAR | Status: AC | PRN
Start: 2023-09-23 — End: 2023-09-23
  Administered 2023-09-23: 2 mL via INTRA_ARTICULAR

## 2023-09-23 MED ORDER — LIDOCAINE HCL 1 % IJ SOLN
2.0000 mL | INTRAMUSCULAR | Status: AC | PRN
  Administered 2023-09-23: 2 mL

## 2023-09-23 MED ORDER — METHYLPREDNISOLONE ACETATE 40 MG/ML IJ SUSP
80.0000 mg | INTRAMUSCULAR | Status: AC | PRN
Start: 2023-09-23 — End: 2023-09-23
  Administered 2023-09-23: 80 mg via INTRA_ARTICULAR

## 2023-09-23 NOTE — Progress Notes (Signed)
 Office Visit Note   Patient: Harold Wilson.           Date of Birth: [birth year]           MRN: [id number] Visit Date: 09/23/2023              Requested by: Merna Huxley, NP [address] [place],  [place] [address] PCP: Merna Huxley, NP   Assessment & Plan: Visit Diagnoses:  1. Complete tear of left rotator cuff, unspecified whether traumatic     Plan: Impression is 78 year old gentleman approximately 1 year status post left shoulder injury with recent MRI findings showing full-thickness retracted rotator cuff tears of the supraspinatus, infraspinatus and superior subscapularis tendons with evidence moderate atrophy in addition to mild to moderate glenohumeral OA and proximal biceps tendon rupture.  Based on the patient's age and MRI findings, I have discussed referral to Dr. Burnetta for ultrasound-guided glenohumeral cortisone injection as well as referral to outpatient physical therapy.  If his symptoms do not improve, I provided him with Dr. Addie and Dr. Danetta names for discussion of reverse shoulder replacement.  Follow-Up Instructions: Return if symptoms worsen or fail to improve.   Orders:  No orders of the defined types were placed in this encounter.  No orders of the defined types were placed in this encounter.     Procedures: No procedures performed   Clinical Data: No additional findings.   Subjective: Chief Complaint  Patient presents with   Left Shoulder - Pain    HPI patient is a pleasant 78 year old gentleman who comes in today with left shoulder pain.  Symptoms began about a year ago after sustaining a fall.  The majority of his pain is to the deltoid as well as into the biceps.  Symptoms are constant without any specific aggravators.  He does note associated weakness.  He does not take anything for the pain.  He notes occasional paresthesias to his neck and left upper extremity but does have a history of cervical spine radiculopathy and  has undergone surgery by Dr. Colon.  He did undergo cortisone injection by his PCP with temporary relief.  MRI of the left shoulder was performed on 08/09/2023 which shows a retracted full-thickness tear of the supraspinatus infraspinatus and superior subscapularis tendons with moderate fatty atrophy.  Also noted was biceps tendon tear and mild to moderate glenohumeral osteoarthritis.  Review of Systems as detailed in HPI.  All others reviewed and are negative.   Objective: Vital Signs: There were no vitals taken for this visit.  Physical Exam well-developed well-nourished gentleman in no acute distress.  Alert and oriented x 3.  Ortho Exam left shoulder exam: Full forward flexion.  Negative drop arm.  Abduction to about 95 degrees.  External rotation to 45 degrees.  Internal rotation to T12.  He has fair amount of weakness with resisted external rotation.  Pain with empty can testing.  He is neurovascularly intact distally.  Specialty Comments:  MRI CERVICAL SPINE WITHOUT CONTRAST   TECHNIQUE: Multiplanar, multisequence MR imaging of the cervical spine was performed. No intravenous contrast was administered.   COMPARISON:  Cervical radiographs June 20, 2020.   FINDINGS: Alignment: Similar mild anterolisthesis of C2 on C3 and mild retrolisthesis of C3 on C4.   Vertebrae: C4-C6 ACDF. No focal marrow edema to suggest acute fracture or discitis/osteomyelitis. No suspicious bone lesions.   Cord: Normal cord signal.  Stroke   Posterior Fossa, vertebral arteries, paraspinal tissues: Visualized vertebral artery flow  voids are maintained. No evidence of acute abnormality in the visualized inferior posterior fossa. No paraspinal edema.   Disc levels:   C2-C3: Left facet arthropathy with mild left foraminal stenosis. Posterior disc osteophyte complex and ligamentum flavum thickening with mild to moderate canal stenosis.   C3-C4: Posterior disc osteophyte complex with bilateral facet  and uncovertebral hypertrophy. Resulting moderate to severe bilateral foraminal stenosis. Mild canal stenosis.   C4-C5: ACDF. Left greater than right facet uncovertebral hypertrophy. Resulting moderate left and mild right foraminal stenosis. Mild canal stenosis.   C5-C6: ACDF endplate spurring with left greater than right facet and uncovertebral hypertrophy. Resulting moderate left foraminal stenosis. Mild canal stenosis.   C6-C7: Posterior disc osteophyte complex with bilateral facet and uncovertebral hypertrophy. Resulting moderate to severe left and moderate right foraminal stenosis. Mild to moderate canal stenosis.   C7-T1: Posterior disc osteophyte complex with bilateral facet and uncovertebral hypertrophy. Resulting moderate left and mild to moderate right foraminal stenosis. Patent canal.   IMPRESSION: 1. At C3-C4, moderate to severe right greater than left foraminal stenosis with mild canal stenosis. 2. At C6-C7, moderate to severe left and moderate right foraminal stenosis with mild to moderate canal stenosis. 3. At C7-T1, moderate left and mild right foraminal stenosis. 4. At C4-C5 and C5-C6, ACDF with mild canal stenosis and moderate left foraminal stenosis. 5. At C2-C3, mild to moderate canal stenosis and mild left foraminal stenosis.     Electronically Signed   By: Gilmore GORMAN Molt M.D.   On: 05/31/2021 11:48  Imaging: No results found.   [name] History: Patient Active Problem List   Diagnosis Date Noted   Neck pain 06/20/2021   Essential hypertension 01/21/2019   Insomnia 06/02/2012   ADHD (attention deficit hyperactivity disorder) 11/11/2011   ERECTILE DYSFUNCTION, NON-ORGANIC, MILD 02/23/2009   Dyslipidemia 02/22/2008   DIVERTICULOSIS, COLON 02/06/2007   BPH (benign prostatic hyperplasia) 02/06/2007   Osteoarthritis 02/06/2007   History of colonic polyps 02/06/2007   Past Medical History:  Diagnosis Date   BENIGN PROSTATIC HYPERTROPHY 02/06/2007    COLONIC POLYPS, HX OF 02/06/2007   COVID 09/18/2020   per pt   DIVERTICULOSIS, COLON 02/06/2007   ERECTILE DYSFUNCTION, NON-ORGANIC, MILD 02/23/2009   HYPERLIPIDEMIA 02/22/2008   Hypertension    OSTEOARTHRITIS 02/06/2007    Family History  Problem Relation Age of Onset   COPD Mother    Heart disease Mother    Heart failure Mother    Cancer Father        lung ca    Past Surgical History:  Procedure Laterality Date   CERVICAL SPINE SURGERY     ROTATOR CUFF REPAIR     TEMPOROMANDIBULAR JOINT SURGERY     TONSILLECTOMY     VASECTOMY     Social History   Occupational History   Occupation: retired  Tobacco Use   Smoking status: Former   Smokeless tobacco: Never  Advertising account planner   Vaping status: Never Used  Substance and Sexual Activity   Alcohol use: Yes    Alcohol/week: 1.0 standard drink of alcohol    Types: 1 Cans of beer per week   Drug use: No   Sexual activity: Not on file

## 2023-09-23 NOTE — Progress Notes (Signed)
   Procedure Note  Patient: Harold Wilson.             Date of Birth: 02/22/1946           MRN: [id number]             Visit Date: 09/23/2023  Procedures: Visit Diagnoses:  1. Chronic left shoulder pain    Large Joint Inj: L glenohumeral on 09/23/2023 3:46 PM Indications: pain Details: 22 G 3.5 in needle, ultrasound-guided posterior approach Medications: 2 mL lidocaine  1 %; 2 mL bupivacaine  0.25 %; 80 mg methylPREDNISolone  acetate 40 MG/ML Outcome: tolerated well, no immediate complications  US -guided glenohumeral joint injection, left shoulder After discussion on risks/benefits/indications, informed verbal consent was obtained. A timeout was then performed. The patient was positioned lying lateral recumbent on examination table. The patient's shoulder was prepped with betadine and multiple alcohol swabs and utilizing ultrasound guidance, the patient's glenohumeral joint was identified on ultrasound. Using ultrasound guidance a 22-gauge, 3.5 inch needle with a mixture of 2:2:2 cc's lidocaine :bupivicaine:depomedrol was directed from a lateral to medial direction via in-plane technique into the glenohumeral joint with visualization of appropriate spread of injectate into the joint. Patient tolerated the procedure well without immediate complications.      Procedure, treatment alternatives, risks and benefits explained, specific risks discussed. Consent was given by the patient. Immediately prior to procedure a time out was called to verify the correct patient, procedure, equipment, support staff and site/side marked as required. Patient was prepped and draped in the usual sterile fashion.     - patient tolerated procedure well, discussed post-injection protocol - follow-up with Morna Bimler as indicated - she did also mention f/u with Dr. Addie if symptoms do not fully improve; I am happy to see them as needed  Lonell Sprang, DO Primary Care Sports Medicine Physician  [facility] - Orthopedics  This note was dictated using Dragon naturally speaking software and may contain errors in syntax, spelling, or content which have not been identified prior to signing this note.

## 2023-10-06 ENCOUNTER — Other Ambulatory Visit: Payer: Self-pay | Admitting: Adult Health

## 2023-10-06 ENCOUNTER — Other Ambulatory Visit: Payer: Self-pay | Admitting: Cardiovascular Disease

## 2023-10-06 DIAGNOSIS — J988 Other specified respiratory disorders: Secondary | ICD-10-CM

## 2023-11-04 ENCOUNTER — Encounter: Admitting: Family Medicine

## 2023-11-04 ENCOUNTER — Ambulatory Visit: Admitting: Adult Health

## 2023-11-04 ENCOUNTER — Encounter: Payer: Self-pay | Admitting: Adult Health

## 2023-11-04 VITALS — BP 140/88 | HR 84 | Temp 98.4°F | Ht 68.0 in | Wt 174.0 lb

## 2023-11-04 DIAGNOSIS — F5101 Primary insomnia: Secondary | ICD-10-CM | POA: Diagnosis not present

## 2023-11-04 DIAGNOSIS — M150 Primary generalized (osteo)arthritis: Secondary | ICD-10-CM | POA: Diagnosis not present

## 2023-11-04 DIAGNOSIS — Z0000 Encounter for general adult medical examination without abnormal findings: Secondary | ICD-10-CM

## 2023-11-04 DIAGNOSIS — Z23 Encounter for immunization: Secondary | ICD-10-CM

## 2023-11-04 DIAGNOSIS — I10 Essential (primary) hypertension: Secondary | ICD-10-CM

## 2023-11-04 DIAGNOSIS — F1010 Alcohol abuse, uncomplicated: Secondary | ICD-10-CM | POA: Diagnosis not present

## 2023-11-04 DIAGNOSIS — R351 Nocturia: Secondary | ICD-10-CM

## 2023-11-04 DIAGNOSIS — N401 Enlarged prostate with lower urinary tract symptoms: Secondary | ICD-10-CM | POA: Diagnosis not present

## 2023-11-04 DIAGNOSIS — E538 Deficiency of other specified B group vitamins: Secondary | ICD-10-CM | POA: Diagnosis not present

## 2023-11-04 DIAGNOSIS — F419 Anxiety disorder, unspecified: Secondary | ICD-10-CM

## 2023-11-04 LAB — CBC
HCT: 41.9 % (ref 39.0–52.0)
Hemoglobin: 14 g/dL (ref 13.0–17.0)
MCHC: 33.5 g/dL (ref 30.0–36.0)
MCV: 90.3 fl (ref 78.0–100.0)
Platelets: 172 K/uL (ref 150.0–400.0)
RBC: 4.64 Mil/uL (ref 4.22–5.81)
RDW: 13.6 % (ref 11.5–15.5)
WBC: 3.7 K/uL — ABNORMAL LOW (ref 4.0–10.5)

## 2023-11-04 LAB — COMPREHENSIVE METABOLIC PANEL WITH GFR
ALT: 12 U/L (ref 0–53)
AST: 15 U/L (ref 0–37)
Albumin: 4.2 g/dL (ref 3.5–5.2)
Alkaline Phosphatase: 45 U/L (ref 39–117)
BUN: 25 mg/dL — ABNORMAL HIGH (ref 6–23)
CO2: 28 meq/L (ref 19–32)
Calcium: 8.5 mg/dL (ref 8.4–10.5)
Chloride: 105 meq/L (ref 96–112)
Creatinine, Ser: 1.56 mg/dL — ABNORMAL HIGH (ref 0.40–1.50)
GFR: 43.12 mL/min — ABNORMAL LOW (ref >60.00)
Glucose, Bld: 84 mg/dL (ref 70–99)
Potassium: 4.4 meq/L (ref 3.5–5.1)
Sodium: 142 meq/L (ref 135–145)
Total Bilirubin: 0.9 mg/dL (ref 0.2–1.2)
Total Protein: 6.6 g/dL (ref 6.0–8.3)

## 2023-11-04 LAB — TSH: TSH: 2.77 u[IU]/mL (ref 0.35–5.50)

## 2023-11-04 LAB — LIPID PANEL
Cholesterol: 145 mg/dL (ref 0–200)
HDL: 56.2 mg/dL (ref >39.00)
LDL Cholesterol: 78 mg/dL (ref 0–99)
NonHDL: 88.9
Total CHOL/HDL Ratio: 3
Triglycerides: 53 mg/dL (ref 0.0–149.0)
VLDL: 10.6 mg/dL (ref 0.0–40.0)

## 2023-11-04 LAB — VITAMIN B12: Vitamin B-12: 157 pg/mL — ABNORMAL LOW (ref 211–911)

## 2023-11-04 LAB — PSA: PSA: 2.52 ng/mL (ref 0.10–4.00)

## 2023-11-04 NOTE — Addendum Note (Signed)
 Addended by: MERNA DARLEENE CROME on: 11/04/2023 11:35 AM   Modules accepted: Level of Service

## 2023-11-04 NOTE — Progress Notes (Signed)
 Subjective:    Patient ID: Harold KATHEE Taft Mickey., male    DOB: 08-May-1945, 78 y.o.   MRN: [id number]  HPI Patient presents for yearly preventative medicine examination. He is a pleasant 78 year old male who  has a past medical history of BENIGN PROSTATIC HYPERTROPHY (02/06/2007), COLONIC POLYPS, HX OF (02/06/2007), COVID (09/18/2020), DIVERTICULOSIS, COLON (02/06/2007), ERECTILE DYSFUNCTION, NON-ORGANIC, MILD (02/23/2009), HYPERLIPIDEMIA (02/22/2008), Hypertension, and OSTEOARTHRITIS (02/06/2007).  Hypertension-managed with Norvasc  10 mg daily and lisinopril  40 mg daily.  He does check his blood pressure at home periodically and reports readings in the 120s to 140s over 70s to 80s.  He denies chest pain or shortness of breath. Intermittently he will have a moment of lightheadedness when standing up after sitting for a long period of time.  BP Readings from Last 3 Encounters:  11/04/23 (!) 140/88  08/20/23 120/60  08/01/23 122/80   Anxiety-managed with Prozac  20 mg daily.  He does feel well controlled on this medication.  Osteoarthritis of multiple joints-currently treated with Tramadol  50 to 100 mg twice daily, and Flexeril  5 mg 3 times daily as needed. He was told that he need to have a left shoulder replacement but has not decided if he is going to do this yet.   BPH-uses Flomax  0.4 mg daily and Cialis  5 mg daily.  Insomnia-due to Ambien  10 mg nightly.  He does feel as though he gets a restful night sleep without side effects such as grogginess, dizziness, or lightheadedness when waking up in the morning  Alcohol Abuse - he reports that he is drinking less,  I drink a couple of airplane bottles a day.   History of B12 deficiency - in the past - he does not take any supplements. Denies symptoms of B12 deficiency   All immunizations and health maintenance protocols were reviewed with the patient and needed orders were placed.  Appropriate screening laboratory values were ordered for the  patient including screening of hyperlipidemia, renal function and hepatic function. If indicated by BPH, a PSA was ordered.  Medication reconciliation,  past medical history, social history, problem list and allergies were reviewed in detail with the patient  Goals were established with regard to weight loss, exercise, and  diet in compliance with medications  He is due for colon cancer screening - he is going to call and schedule   Review of Systems  Constitutional: Negative.   HENT: Negative.    Eyes: Negative.   Respiratory: Negative.    Cardiovascular: Negative.   Gastrointestinal: Negative.   Endocrine: Negative.   Genitourinary: Negative.   Musculoskeletal: Negative.   Skin: Negative.   Allergic/Immunologic: Negative.   Neurological: Negative.   Hematological: Negative.   Psychiatric/Behavioral: Negative.    All other systems reviewed and are negative.  Past Medical History:  Diagnosis Date   BENIGN PROSTATIC HYPERTROPHY 02/06/2007   COLONIC POLYPS, HX OF 02/06/2007   COVID 09/18/2020   per pt   DIVERTICULOSIS, COLON 02/06/2007   ERECTILE DYSFUNCTION, NON-ORGANIC, MILD 02/23/2009   HYPERLIPIDEMIA 02/22/2008   Hypertension    OSTEOARTHRITIS 02/06/2007    Social History   Socioeconomic History   Marital status: Married    Spouse name: Not on file   Number of children: 5   Years of education: Not on file   Highest education level: GED or equivalent  Occupational History   Occupation: retired  Tobacco Use   Smoking status: Former   Smokeless tobacco: Never  Advertising account planner   Vaping status: Never  Used  Substance and Sexual Activity   Alcohol use: Yes    Alcohol/week: 1.0 standard drink of alcohol    Types: 1 Cans of beer per week   Drug use: No   Sexual activity: Not on file  Other Topics Concern   Not on file  Social History Narrative   Retired    Married    Social Drivers of Corporate investment banker Strain: Medium Risk (08/19/2023)   Overall  Financial Resource Strain (CARDIA)    Difficulty of Paying Living Expenses: Somewhat hard  Food Insecurity: No Food Insecurity (08/19/2023)   Hunger Vital Sign    Worried About Running Out of Food in the Last Year: Never true    Ran Out of Food in the Last Year: Never true  Transportation Needs: No Transportation Needs (08/19/2023)   PRAPARE - Administrator, Civil Service (Medical): No    Lack of Transportation (Non-Medical): No  Physical Activity: Sufficiently Active (08/19/2023)   Exercise Vital Sign    Days of Exercise per Week: 7 days    Minutes of Exercise per Session: 30 min  Stress: Stress Concern Present (08/19/2023)   [facility] [employer] - Occupational Stress Questionnaire    Feeling of Stress: Rather much  Social Connections: Moderately Isolated (08/19/2023)   Social Connection and Isolation Panel    Frequency of Communication with Friends and Family: More than three times a week    Frequency of Social Gatherings with Friends and Family: More than three times a week    Attends Religious Services: Never    Database administrator or Organizations: No    Attends Engineer, structural: Not on file    Marital Status: Married  Catering manager Violence: Not At Risk (10/24/2022)   Humiliation, Afraid, Rape, and Kick questionnaire    Fear of Current or Ex-Partner: No    Emotionally Abused: No    Physically Abused: No    Sexually Abused: No    Past Surgical History:  Procedure Laterality Date   CERVICAL SPINE SURGERY     ROTATOR CUFF REPAIR     TEMPOROMANDIBULAR JOINT SURGERY     TONSILLECTOMY     VASECTOMY      Family History  Problem Relation Age of Onset   COPD Mother    Heart disease Mother    Heart failure Mother    Cancer Father        lung ca    Allergies  Allergen Reactions   Nickel Rash   Sulfacetamide Sodium     Dryness of mouth     Current Outpatient Medications on File Prior to Visit  Medication Sig Dispense  Refill   albuterol  (VENTOLIN  HFA) 108 (90 Base) MCG/ACT inhaler Inhale 2 puffs into the lungs every 6 (six) hours as needed for wheezing or shortness of breath. 8 g 0   amLODipine  (NORVASC ) 10 MG tablet TAKE 1 TABLET BY MOUTH DAILY 30 tablet 5   brimonidine (ALPHAGAN) 0.2 % ophthalmic solution      cyclobenzaprine  (FLEXERIL ) 5 MG tablet TAKE ONE TABLET BY MOUTH THREE TIMES A DAY AS NEEDED FOR MUSCLE SPASMS 15 tablet 0   diclofenac  (VOLTAREN ) 75 MG EC tablet Take 1 tablet (75 mg total) by mouth 2 (two) times daily as needed. 60 tablet 2   FLUoxetine  (PROZAC ) 40 MG capsule Take 1 capsule (40 mg total) by mouth daily. 90 capsule 1   fluticasone  (FLONASE ) 50 MCG/ACT nasal spray Place 2  sprays into both nostrils daily. 48 g 2   gabapentin  (NEURONTIN ) 100 MG capsule Take 1 capsule (100 mg total) by mouth 3 (three) times daily as needed. 30 capsule 2   lisinopril  (ZESTRIL ) 40 MG tablet TAKE 1 TABLET BY MOUTH DAILY 90 tablet 3   tadalafil  (CIALIS ) 5 MG tablet Take 1 tablet (5 mg total) by mouth daily. 90 tablet 3   tamsulosin  (FLOMAX ) 0.4 MG CAPS capsule Take 1 capsule (0.4 mg total) by mouth daily. 90 capsule 3   traMADol  (ULTRAM ) 50 MG tablet TAKE 1 TO 2 TABLETS BY MOUTH DAILY AS NEEDED 20 tablet 2   zolpidem  (AMBIEN ) 10 MG tablet Take 1 tablet (10 mg total) by mouth at bedtime as needed. 30 tablet 2   No current facility-administered medications on file prior to visit.    BP (!) 140/88   Pulse 84   Temp 98.4 F (36.9 C) (Oral)   Ht 5' 8 (1.727 m)   Wt 174 lb (78.9 kg)   SpO2 96%   BMI 26.46 kg/m       Objective:   Physical Exam Vitals and nursing note reviewed.  Constitutional:      General: He is not in acute distress.    Appearance: Normal appearance. He is not ill-appearing.  HENT:     Head: Normocephalic and atraumatic.     Right Ear: Tympanic membrane, ear canal and external ear normal. There is no impacted cerumen.     Left Ear: Tympanic membrane, ear canal and external ear  normal. There is no impacted cerumen.     Nose: Nose normal. No congestion or rhinorrhea.     Mouth/Throat:     Mouth: Mucous membranes are moist.     Pharynx: Oropharynx is clear.  Eyes:     Extraocular Movements: Extraocular movements intact.     Conjunctiva/sclera: Conjunctivae normal.     Pupils: Pupils are equal, round, and reactive to light.  Neck:     Vascular: No carotid bruit.  Cardiovascular:     Rate and Rhythm: Normal rate and regular rhythm.     Pulses: Normal pulses.     Heart sounds: No murmur heard.    No friction rub. No gallop.  Pulmonary:     Effort: Pulmonary effort is normal.     Breath sounds: Normal breath sounds.  Abdominal:     General: Abdomen is flat. Bowel sounds are normal. There is no distension.     Palpations: Abdomen is soft. There is no mass.     Tenderness: There is no abdominal tenderness. There is no guarding or rebound.     Hernia: No hernia is present.  Musculoskeletal:        General: Normal range of motion.     Cervical back: Normal range of motion and neck supple.  Lymphadenopathy:     Cervical: No cervical adenopathy.  Skin:    General: Skin is warm and dry.     Capillary Refill: Capillary refill takes less than 2 seconds.  Neurological:     General: No focal deficit present.     Mental Status: He is alert and oriented to person, place, and time.  Psychiatric:        Mood and Affect: Mood normal.        Behavior: Behavior normal.        Thought Content: Thought content normal.        Judgment: Judgment normal.        Assessment &  Plan:  1. Routine general medical examination at a health care facility (Primary) Today patient counseled on age appropriate routine health concerns for screening and prevention, each reviewed and up to date or declined. Immunizations reviewed and up to date or declined. Labs ordered and reviewed. Risk factors for depression reviewed and negative. Hearing function and visual acuity are intact. ADLs  screened and addressed as needed. Functional ability and level of safety reviewed and appropriate. Education, counseling and referrals performed based on assessed risks today. Patient provided with a copy of personalized plan for preventive services. - Get Tdap at pharmacy  - Call and schedule colonoscopy   2. Essential hypertension - Elevated today. He did not take his medication prior to arrival  - Lipid panel; Future - TSH; Future - CBC; Future - Comprehensive metabolic panel with GFR; Future  3. Anxiety - Continue with SSRI  - Lipid panel; Future - TSH; Future - CBC; Future - Comprehensive metabolic panel with GFR; Future  4. Primary osteoarthritis involving multiple joints - Continue with Tramadol  PRN  - Lipid panel; Future - TSH; Future - CBC; Future - Comprehensive metabolic panel with GFR; Future  5. Benign prostatic hyperplasia with nocturia  - PSA; Future  6. Primary insomnia - Continue with Ambien   - Lipid panel; Future - TSH; Future - CBC; Future - Comprehensive metabolic panel with GFR; Future  7. Alcohol abuse - Encouraged to refrain from alcohol  - Lipid panel; Future - TSH; Future - CBC; Future - Comprehensive metabolic panel with GFR; Future - Vitamin B12; Future - Vitamin B1; Future  8. B12 deficiency  - Vitamin B12; Future - Vitamin B1; Future  9. Need for influenza vaccination  - Flu vaccine HIGH DOSE PF(Fluzone Trivalent)  Hazell Siwik, NP

## 2023-11-04 NOTE — Patient Instructions (Signed)
 It was great seeing you today   We will follow up with you regarding your lab work   Please let me know if you need anything   Call and schedule your colonoscopy   Please refrain from alcohol

## 2023-11-05 ENCOUNTER — Ambulatory Visit: Payer: Self-pay | Admitting: Adult Health

## 2023-11-06 ENCOUNTER — Other Ambulatory Visit: Payer: Self-pay | Admitting: Adult Health

## 2023-11-06 ENCOUNTER — Telehealth: Payer: Self-pay | Admitting: *Deleted

## 2023-11-06 DIAGNOSIS — F419 Anxiety disorder, unspecified: Secondary | ICD-10-CM

## 2023-11-06 DIAGNOSIS — N401 Enlarged prostate with lower urinary tract symptoms: Secondary | ICD-10-CM

## 2023-11-06 DIAGNOSIS — I10 Essential (primary) hypertension: Secondary | ICD-10-CM

## 2023-11-06 MED ORDER — FLUOXETINE HCL 40 MG PO CAPS: 40.0000 mg | ORAL_CAPSULE | Freq: Every day | ORAL | 1 refills | Status: AC

## 2023-11-06 MED ORDER — FLUTICASONE PROPIONATE 50 MCG/ACT NA SUSP: 2.0000 | Freq: Every day | NASAL | 2 refills | Status: AC

## 2023-11-06 MED ORDER — TAMSULOSIN HCL 0.4 MG PO CAPS: 0.4000 mg | ORAL_CAPSULE | Freq: Every day | ORAL | 3 refills | Status: AC

## 2023-11-06 MED ORDER — LISINOPRIL 40 MG PO TABS: 40.0000 mg | ORAL_TABLET | Freq: Every day | ORAL | 3 refills | Status: AC

## 2023-11-06 NOTE — Telephone Encounter (Signed)
 Copied from CRM [id number]. Topic: General - Other >> Nov 06, 2023 10:04 AM Larissa RAMAN wrote: Reason for CRM: Patient requesting a prescription for Covid vaccine  [id number] - [place], [place] - [address] [place] [address] Phone: [phone] Fax: [phone] Hours: Not open 24 hours

## 2023-11-06 NOTE — Telephone Encounter (Signed)
 Copied from CRM [id number]. Topic: Clinical - Medication Refill >> Nov 06, 2023 10:06 AM Larissa S wrote: Medication: FLUoxetine  (PROZAC ) 40 MG capsule tamsulosin  (FLOMAX ) 0.4 MG CAPS capsule fluticasone  (FLONASE ) 50 MCG/ACT nasal spray lisinopril  (ZESTRIL ) 40 MG tablet  Has the patient contacted their pharmacy? Yes (Agent: If no, request that the patient contact the pharmacy for the refill. If patient does not wish to contact the pharmacy document the reason why and proceed with request.) (Agent: If yes, when and what did the pharmacy advise?)  This is the patient's preferred pharmacy:  [id number] - [place], [place] - [address] [place] [address] Phone: [phone] Fax: [phone]    Is this the correct pharmacy for this prescription? Yes If no, delete pharmacy and type the correct one.   Has the prescription been filled recently? No  Is the patient out of the medication? No  Has the patient been seen for an appointment in the last year OR does the patient have an upcoming appointment? Yes  Can we respond through MyChart? No  Agent: Please be advised that Rx refills may take up to 3 business days. We ask that you follow-up with your pharmacy.

## 2023-11-07 MED ORDER — COVID-19 MRNA VACC (MODERNA) 50 MCG/0.5ML IM SUSP: 0.5000 mL | Freq: Once | INTRAMUSCULAR | 0 refills | Status: AC

## 2023-11-07 NOTE — Addendum Note (Signed)
 Addended by: VICCI LEADER R on: 11/07/2023 04:56 PM   Modules accepted: Orders

## 2023-11-07 NOTE — Telephone Encounter (Signed)
 Prescription sent to pharmacy.

## 2023-11-08 LAB — VITAMIN B1: Vitamin B1 (Thiamine): 15 nmol/L (ref 8–30)

## 2023-11-25 ENCOUNTER — Encounter: Payer: Self-pay | Admitting: Family Medicine

## 2023-11-25 ENCOUNTER — Ambulatory Visit (INDEPENDENT_AMBULATORY_CARE_PROVIDER_SITE_OTHER): Admitting: Family Medicine

## 2023-11-25 DIAGNOSIS — Z0000 Encounter for general adult medical examination without abnormal findings: Secondary | ICD-10-CM

## 2023-11-25 DIAGNOSIS — Z1211 Encounter for screening for malignant neoplasm of colon: Secondary | ICD-10-CM

## 2023-11-25 NOTE — Progress Notes (Signed)
 Patient was unable to self-report due to a lack of equipment at home via telehealth

## 2023-11-25 NOTE — Progress Notes (Signed)
 PATIENT CHECK-IN and HEALTH RISK ASSESSMENT QUESTIONNAIRE:  -completed by phone for upcoming Medicare Preventive Visit  Pre-Visit Check-in: 1)Vitals (height, wt, BP, etc) - record in vitals section for visit on day of visit Request home vitals (wt, BP, etc.) and enter into vitals, THEN update Vital Signs SmartPhrase below at the top of the HPI. See below.  2)Review and Update Medications, Allergies PMH, Surgeries, Social history in Epic 3)Hospitalizations in the last year with date/reason? No   4)Review and Update Care Team (patient's specialists) in Epic 5) Complete PHQ9 in Epic  6) Complete Fall Screening in Epic 7)Review all Health Maintenance Due and order if not done.  Medicare Wellness Patient Questionnaire:  Answer theses question about your habits: How often do you have a drink containing alcohol? 2-3 days a week How many drinks containing alcohol do you have on a typical day when you are drinking?1-2 drinks  How often do you have six or more drinks on one occasion? No  Have you ever smoked?Yes  Quit date if applicable? 50 years ago   How many packs a day do/did you smoke? Less than a pack  Do you use smokeless tobacco?No  Do you use an illicit drugs?No  On average, how many days per week do you engage in moderate to strenuous exercise (like a brisk walk)?No - but his grandson keeps him busy. He watches his grand-kids.  On average, how many minutes do you engage in exercise at this level?Na  Are you sexually active? Yes  Number of partners? One  Typical breakfast: Cereal, Banana, Biscuit  Typical lunch:Hello fresh meals  Typical dinner:Pizza, Hello fresh meals  Typical snacks: Chips, popcorn, nuts,   Beverages: Diet coke   Answer theses question about your everyday activities: Can you perform most household chores?Yes  Are you deaf or have significant trouble hearing? Yes, Bilateral hearing aids, patient is not using them at this time  Do you feel that you have a problem  with memory?No  Do you feel safe at home?Yes  Last dentist visit? Month ago  8. Do you have any difficulty performing your everyday activities?No  Are you having any difficulty walking, taking medications on your own, and or difficulty managing daily home needs?No  Do you have difficulty walking or climbing stairs?No  Do you have difficulty dressing or bathing?No  Do you have difficulty doing errands alone such as visiting a doctor's office or shopping?No  Do you currently have any difficulty preparing food and eating?No  Do you currently have any difficulty using the toilet?No  Do you have any difficulty managing your finances?No  Do you have any difficulties with housekeeping of managing your housekeeping?NO    Do you have Advanced Directives in place (Living Will, Healthcare Power or Attorney)? No    Last eye Exam and location? 1 month, [facility] in [place]    Do you currently use prescribed or non-prescribed narcotic or opioid pain medications?Yes   Do you have a history or close family history of breast, ovarian, tubal or peritoneal cancer or a family member with BRCA (breast cancer susceptibility 1 and 2) gene mutations?NO    Nurse/Assistant Credentials/time stamp: Leah A.Wright CMA 10:42am   ----------------------------------------------------------------------------------------------------------------------------------------------------------------------------------------------------------------------  Because this visit was a virtual/telehealth visit, some criteria may be missing or patient reported. Any vitals not documented were not able to be obtained and vitals that have been documented are patient reported.    MEDICARE ANNUAL PREVENTIVE CARE VISIT WITH PROVIDER (Welcome to Medicare,  initial annual wellness or annual wellness exam)  Virtual Visit via Phone Note  I connected with Lynwood KATHEE Taft Mickey. on 11/25/23  by phone and verified that I am speaking  with the correct person using two identifiers. Video was not connecting - and he opted to do phone.   Location patient: home Location provider:work or home office Persons participating in the virtual visit: patient, provider  Concerns and/or follow up today: none, doing well.    See HM section in Epic for other details of completed HM.    ROS: negative for report of fevers, unintentional weight loss, vision changes, vision loss, hearing loss or change, chest pain, sob, hemoptysis, melena, hematochezia, hematuria, falls, bleeding or bruising, thoughts of suicide or self harm, memory loss  Patient-completed extensive health risk assessment - reviewed and discussed with the patient: See Health Risk Assessment completed with patient prior to the visit either above or in recent phone note. This was reviewed in detailed with the patient today and appropriate recommendations, orders and referrals were placed as needed per Summary below and patient instructions.   Review of Medical History: -PMH, PSH, Family History and current specialty and care providers reviewed and updated and listed below   Patient Care Team: Merna Huxley, NP as PCP - General (Family Medicine) O'Neal, Darryle Ned, MD as PCP - Cardiology (Cardiology) Liane Sharyne MATSU, [facility] (Inactive) as Pharmacist (Pharmacist)   Past Medical History:  Diagnosis Date   BENIGN PROSTATIC HYPERTROPHY 02/06/2007   COLONIC POLYPS, HX OF 02/06/2007   COVID 09/18/2020   per pt   DIVERTICULOSIS, COLON 02/06/2007   ERECTILE DYSFUNCTION, NON-ORGANIC, MILD 02/23/2009   HYPERLIPIDEMIA 02/22/2008   Hypertension    OSTEOARTHRITIS 02/06/2007    Past Surgical History:  Procedure Laterality Date   CERVICAL SPINE SURGERY     ROTATOR CUFF REPAIR     TEMPOROMANDIBULAR JOINT SURGERY     TONSILLECTOMY     VASECTOMY      Social History   Socioeconomic History   Marital status: Married    Spouse name: Not on file   Number of children: 5    Years of education: Not on file   Highest education level: GED or equivalent  Occupational History   Occupation: retired  Tobacco Use   Smoking status: Former   Smokeless tobacco: Never  Vaping Use   Vaping status: Never Used  Substance and Sexual Activity   Alcohol use: Yes    Alcohol/week: 1.0 standard drink of alcohol    Types: 1 Cans of beer per week   Drug use: No   Sexual activity: Not on file  Other Topics Concern   Not on file  Social History Narrative   Retired    Married    Social Drivers of Corporate investment banker Strain: Medium Risk (08/19/2023)   Overall Financial Resource Strain (CARDIA)    Difficulty of Paying Living Expenses: Somewhat hard  Food Insecurity: No Food Insecurity (08/19/2023)   Hunger Vital Sign    Worried About Running Out of Food in the Last Year: Never true    Ran Out of Food in the Last Year: Never true  Transportation Needs: No Transportation Needs (08/19/2023)   PRAPARE - Administrator, Civil Service (Medical): No    Lack of Transportation (Non-Medical): No  Physical Activity: Sufficiently Active (08/19/2023)   Exercise Vital Sign    Days of Exercise per Week: 7 days    Minutes of Exercise per Session: 30 min  Stress: Stress Concern Present (08/19/2023)   [employer] of Occupational Health - Occupational Stress Questionnaire    Feeling of Stress: Rather much  Social Connections: Moderately Isolated (08/19/2023)   Social Connection and Isolation Panel    Frequency of Communication with Friends and Family: More than three times a week    Frequency of Social Gatherings with Friends and Family: More than three times a week    Attends Religious Services: Never    Database administrator or Organizations: No    Attends Engineer, structural: Not on file    Marital Status: Married  Catering manager Violence: Not At Risk (10/24/2022)   Humiliation, Afraid, Rape, and Kick questionnaire    Fear of Current or Ex-Partner:  No    Emotionally Abused: No    Physically Abused: No    Sexually Abused: No    Family History  Problem Relation Age of Onset   COPD Mother    Heart disease Mother    Heart failure Mother    Cancer Father        lung ca    Current Outpatient Medications on File Prior to Visit  Medication Sig Dispense Refill   albuterol  (VENTOLIN  HFA) 108 (90 Base) MCG/ACT inhaler Inhale 2 puffs into the lungs every 6 (six) hours as needed for wheezing or shortness of breath. 8 g 0   amLODipine  (NORVASC ) 10 MG tablet TAKE 1 TABLET BY MOUTH DAILY 30 tablet 5   brimonidine (ALPHAGAN) 0.2 % ophthalmic solution      cyclobenzaprine  (FLEXERIL ) 5 MG tablet TAKE ONE TABLET BY MOUTH THREE TIMES A DAY AS NEEDED FOR MUSCLE SPASMS 15 tablet 0   diclofenac  (VOLTAREN ) 75 MG EC tablet Take 1 tablet (75 mg total) by mouth 2 (two) times daily as needed. 60 tablet 2   FLUoxetine  (PROZAC ) 40 MG capsule Take 1 capsule (40 mg total) by mouth daily. 90 capsule 1   fluticasone  (FLONASE ) 50 MCG/ACT nasal spray Place 2 sprays into both nostrils daily. 48 g 2   gabapentin  (NEURONTIN ) 100 MG capsule Take 1 capsule (100 mg total) by mouth 3 (three) times daily as needed. 30 capsule 2   lisinopril  (ZESTRIL ) 40 MG tablet Take 1 tablet (40 mg total) by mouth daily. 90 tablet 3   tadalafil  (CIALIS ) 5 MG tablet Take 1 tablet (5 mg total) by mouth daily. 90 tablet 3   tamsulosin  (FLOMAX ) 0.4 MG CAPS capsule Take 1 capsule (0.4 mg total) by mouth daily. 90 capsule 3   traMADol  (ULTRAM ) 50 MG tablet TAKE 1 TO 2 TABLETS BY MOUTH DAILY AS NEEDED 20 tablet 2   zolpidem  (AMBIEN ) 10 MG tablet Take 1 tablet (10 mg total) by mouth at bedtime as needed. 30 tablet 2   No current facility-administered medications on file prior to visit.    Allergies  Allergen Reactions   Nickel Rash   Sulfacetamide Sodium     Dryness of mouth        Physical Exam Vitals requested from patient and listed below if patient had equipment and was able to  obtain at home for this virtual visit: There were no vitals filed for this visit. Estimated body mass index is 26.46 kg/m as calculated from the following:   Height as of 11/04/23: 5' 8 (1.727 m).   Weight as of 11/04/23: 174 lb (78.9 kg).  EKG (optional): deferred due to virtual visit  GENERAL: alert, oriented, no acute distress detected; full vision exam deferred due to  pandemic and/or virtual encounter  PSYCH/NEURO: pleasant and cooperative, no obvious depression or anxiety, speech and thought processing grossly intact, Cognitive function grossly intact  Flowsheet Row Clinical Support from 11/25/2023 in [facility]  PHQ-9 Total Score 4        11/25/2023   10:27 AM 04/28/2023   12:56 PM 10/29/2022    8:54 AM 10/24/2022   11:41 AM 10/22/2022   10:12 AM  Depression screen PHQ 2/9  Decreased Interest 0 0 0 0 2  Down, Depressed, Hopeless 0 0 0 1 2  PHQ - 2 Score 0 0 0 1 4  Altered sleeping 3 0 0 0 2  Tired, decreased energy 1 0 0 1 2  Change in appetite 0 0 0 0 0  Feeling bad or failure about yourself  0 0 0 1 1  Trouble concentrating 0 0 0 1 2  Moving slowly or fidgety/restless 0 0 0 0 0  Suicidal thoughts 0 0 0 0 0  PHQ-9 Score 4 0 0 4 11  Difficult doing work/chores   Not difficult at all Not difficult at all Somewhat difficult       07/26/2022    1:24 PM 10/24/2022   11:42 AM 10/29/2022    8:54 AM 04/28/2023   12:56 PM 11/25/2023   10:27 AM  Fall Risk  Falls in the past year? 1 1 1  0 0  Was there an injury with Fall? 1 1 1  0 0  Fall Risk Category Calculator 2 3 3  0 0  Patient at Risk for Falls Due to No Fall Risks No Fall Risks History of fall(s)  No Fall Risks  Fall risk Follow up Falls evaluation completed Falls evaluation completed   Falls evaluation completed     SUMMARY AND PLAN:  Medicare annual wellness visit, subsequent  Screen for colon cancer - Plan: Ambulatory referral to Gastroenterology   Discussed applicable health  maintenance/preventive health measures and advised and referred or ordered per patient preferences: -reports he is due for his colonoscopy, sent referral -reports he will get the Tdap at the pharmacy -he had his covid vaccine last week, asked him to obtain record and we will update his record. Health Maintenance  Topic Date Due   DTaP/Tdap/Td (2 - Td or Tdap) 09/06/2021   COVID-19 Vaccine (10 - 2025-26 season) 12/11/2023 (Originally 11/03/2023)   Colonoscopy  11/24/2024 (Originally 08/12/2023)   Medicare Annual Wellness (AWV)  11/24/2024   Pneumococcal Vaccine: 50+ Years  Completed   Influenza Vaccine  Completed   Hepatitis C Screening  Completed   Zoster Vaccines- Shingrix  Completed   HPV VACCINES  Aged Out   Meningococcal B Vaccine  Aged Out     Education and counseling on the following was provided based on the above review of health and a plan/checklist for the patient, along with additional information discussed, was provided for the patient in the patient instructions :  -Advised and counseled on a healthy lifestyle - including the importance of a healthy diet, regular physical activity -has good social connections, taking care of grandson several days per week, goes to his school and to the park, reports stays busy -Reviewed patient's current diet. Advised and counseled on a whole foods based healthy diet. A summary of a healthy diet was provided in the Patient Instructions. Advised to stay hydrated and advised water rather than soda. Encouraged to reduce foods with added sugar and ultraprocessed foods. Discussed recs form his PCP for adding  B complex and increasing water intake and reinforced.  -reviewed patient's current physical activity level and discussed exercise guidelines for adults. Discussed community resources and ideas for safe exercise at home to assist in meeting exercise guideline recommendations in a safe and healthy way.  -provided info on advanced directives - see  patient instructions -Advise yearly dental visits at minimum and regular eye exams   Follow up: see patient instructions   Patient Instructions  I really enjoyed getting to talk with you today! I am available on Tuesdays and Thursdays for virtual visits if you have any questions or concerns, or if I can be of any further assistance.   CHECKLIST FROM ANNUAL WELLNESS VISIT:  -Follow up (please call to schedule if not scheduled after visit):   -yearly for annual wellness visit with primary care office  Here is a list of your preventive care/health maintenance measures and the plan for each if any are due:  PLAN For any measures below that may be due:    1. Referral placed to the Gastroenterologist for colon cancer screening. If you are not contacted in the next 1-2 weeks please call their office: [phone]   2. Can get the tetanus booster at the pharmacy. Please let us  know when you do so that we can update your record. Please bring proof of receipt for any vaccines done at the pharmacy so that we can update your records accurately. Thanks!  Health Maintenance  Topic Date Due   DTaP/Tdap/Td (2 - Td or Tdap) 09/06/2021   COVID-19 Vaccine (10 - 2025-26 season) 12/11/2023 (Originally 11/03/2023)   Colonoscopy  11/24/2024 (Originally 08/12/2023)   Medicare Annual Wellness (AWV)  11/24/2024   Pneumococcal Vaccine: 50+ Years  Completed   Influenza Vaccine  Completed   Hepatitis C Screening  Completed   Zoster Vaccines- Shingrix  Completed   HPV VACCINES  Aged Out   Meningococcal B Vaccine  Aged Out    -See a dentist at least yearly  -Get your eyes checked and then per your eye specialist's recommendations  -Other issues addressed today:   -I have included below further information regarding a healthy whole foods based diet, physical activity guidelines for adults, stress management and opportunities for social connections. I hope you find this information useful.    -----------------------------------------------------------------------------------------------------------------------------------------------------------------------------------------------------------------------------------------------------------    NUTRITION: -eat real food: lots of colorful vegetables (half the plate) and fruits -5-7 servings of vegetables and fruits per day (fresh or steamed is best), exp. 2 servings of vegetables with lunch and dinner and 2 servings of fruit per day. Berries and greens such as kale and collards are great choices.  -consume on a regular basis:  fresh fruits, fresh veggies, fish, nuts, seeds, healthy oils (such as olive oil, avocado oil), whole grains (make sure for bread/pasta/crackers/etc., that the first ingredient on label contains the word whole), legumes. -can eat small amounts of dairy and lean meat (no larger than the palm of your hand), but avoid processed meats such as ham, bacon, lunch meat, etc. -drink water -try to avoid fast food and pre-packaged foods, processed meat, ultra processed foods/beverages (donuts, candy, etc.) -most experts advise limiting sodium to < 2300mg  per day, should limit further is any chronic conditions such as high blood pressure, heart disease, diabetes, etc. The American Heart Association advised that < 1500mg  is is ideal -try to avoid foods/beverages that contain any ingredients with names you do not recognize  -try to avoid foods/beverages  with added sugar or sweeteners/sweets  -try  to avoid sweet drinks (including diet drinks): soda, juice, Gatorade, sweet tea, power drinks, diet drinks -try to avoid white rice, white bread, pasta (unless whole grain)  EXERCISE GUIDELINES FOR ADULTS: -if you wish to increase your physical activity, do so gradually and with the approval of your doctor -STOP and seek medical care immediately if you have any chest pain, chest discomfort or trouble breathing when starting or  increasing exercise  -move and stretch your body, legs, feet and arms when sitting for long periods -Physical activity guidelines for optimal health in adults: -get at least 150 minutes per week of moderate exercise (can talk, but not sing); this is about 20-30 minutes of sustained activity 5-7 days per week or two 10-15 minute episodes of sustained activity 5-7 days per week -do some muscle building/resistance training/strength training at least 2 days per week  -balance exercises 3+ days per week:   Stand somewhere where you have something sturdy to hold onto if you lose balance    1) lift up on toes, then back down, start with 5x per day and work up to 20x   2) stand and lift one leg straight out to the side so that foot is a few inches of the floor, start with 5x each side and work up to 20x each side   3) stand on one foot, start with 5 seconds each side and work up to 20 seconds on each side  If you need ideas or help with getting more active:  -Silver sneakers https://tools.silversneakers.com  -Walk with a Doc: http://www.duncan-williams.com/  -try to include resistance (weight lifting/strength building) and balance exercises twice per week: or the following link for ideas: http://castillo-powell.com/  BuyDucts.dk  STRESS MANAGEMENT: -can try meditating, or just sitting quietly with deep breathing while intentionally relaxing all parts of your body for 5 minutes daily -if you need further help with stress, anxiety or depression please follow up with your primary doctor or contact the wonderful folks at [facility]: [phone]  SOCIAL CONNECTIONS: -options in [place] if you wish to engage in more social and exercise related activities:  -Silver sneakers https://tools.silversneakers.com  -Walk with a Doc: http://www.duncan-williams.com/  -Check out the [facility] Active Adults 50+  section on the [place] of [employer] (hiking clubs, book clubs, cards and games, chess, exercise classes, aquatic classes and much more) - see the website for details: https://www.[place].gov/departments/parks-recreation/active-adults50  -YouTube has lots of exercise videos for different ages and abilities as well  -[facility] (a variety of indoor and outdoor inperson activities for adults). [phone]. [address]  -Virtual Online Classes (a variety of topics): see seniorplanet.org or call 6467630375  -consider volunteering at a school, hospice center, church, senior center or elsewhere   ADVANCED HEALTHCARE DIRECTIVES:  [place] Advanced Directives assistance:   ExpressWeek.com.cy  Everyone should have advanced health care directives in place. This is so that you get the care you want, should you ever be in a situation where you are unable to make your own medical decisions.   From the [place] Advanced Directive Website: Advance Health Care Directives are legal documents in which you give written instructions about your health care if, in the future, you cannot speak for yourself.   A health care power of attorney allows you to name a person you trust to make your health care decisions if you cannot make them yourself. A declaration of a desire for a natural death (or living will) is document, which states that you desire  not to have your life prolonged by extraordinary measures if you have a terminal or incurable illness or if you are in a vegetative state. An advance instruction for mental health treatment makes a declaration of instructions, information and preferences regarding your mental health treatment. It also states that you are aware that the advance instruction authorizes a mental health treatment provider to act according to your wishes. It may also outline your consent or refusal of mental  health treatment. A declaration of an anatomical gift allows anyone over the age of 37 to make a gift by will, organ donor card or other document.   Please see the following website or an elder law attorney for forms, FAQs and for completion of advanced directives: [place]  Print production planner Health Care Directives Advance Health Care Directives (http://guzman.com/)  Or copy and paste the following to your web browser: PoshChat.[email or web address]           Chiquita JONELLE Cramp, DO

## 2023-11-25 NOTE — Patient Instructions (Signed)
 I really enjoyed getting to talk with you today! I am available on Tuesdays and Thursdays for virtual visits if you have any questions or concerns, or if I can be of any further assistance.   CHECKLIST FROM ANNUAL WELLNESS VISIT:  -Follow up (please call to schedule if not scheduled after visit):   -yearly for annual wellness visit with primary care office  Here is a list of your preventive care/health maintenance measures and the plan for each if any are due:  PLAN For any measures below that may be due:    1. Referral placed to the Gastroenterologist for colon cancer screening. If you are not contacted in the next 1-2 weeks please call their office: [phone]   2. Can get the tetanus booster at the pharmacy. Please let us  know when you do so that we can update your record. Please bring proof of receipt for any vaccines done at the pharmacy so that we can update your records accurately. Thanks!  Health Maintenance  Topic Date Due   DTaP/Tdap/Td (2 - Td or Tdap) 09/06/2021   COVID-19 Vaccine (10 - 2025-26 season) 12/11/2023 (Originally 11/03/2023)   Colonoscopy  11/24/2024 (Originally 08/12/2023)   Medicare Annual Wellness (AWV)  11/24/2024   Pneumococcal Vaccine: 50+ Years  Completed   Influenza Vaccine  Completed   Hepatitis C Screening  Completed   Zoster Vaccines- Shingrix  Completed   HPV VACCINES  Aged Out   Meningococcal B Vaccine  Aged Out    -See a dentist at least yearly  -Get your eyes checked and then per your eye specialist's recommendations  -Other issues addressed today:   -I have included below further information regarding a healthy whole foods based diet, physical activity guidelines for adults, stress management and opportunities for social connections. I hope you find this information useful.    -----------------------------------------------------------------------------------------------------------------------------------------------------------------------------------------------------------------------------------------------------------    NUTRITION: -eat real food: lots of colorful vegetables (half the plate) and fruits -5-7 servings of vegetables and fruits per day (fresh or steamed is best), exp. 2 servings of vegetables with lunch and dinner and 2 servings of fruit per day. Berries and greens such as kale and collards are great choices.  -consume on a regular basis:  fresh fruits, fresh veggies, fish, nuts, seeds, healthy oils (such as olive oil, avocado oil), whole grains (make sure for bread/pasta/crackers/etc., that the first ingredient on label contains the word whole), legumes. -can eat small amounts of dairy and lean meat (no larger than the palm of your hand), but avoid processed meats such as ham, bacon, lunch meat, etc. -drink water -try to avoid fast food and pre-packaged foods, processed meat, ultra processed foods/beverages (donuts, candy, etc.) -most experts advise limiting sodium to < 2300mg  per day, should limit further is any chronic conditions such as high blood pressure, heart disease, diabetes, etc. The American Heart Association advised that < 1500mg  is is ideal -try to avoid foods/beverages that contain any ingredients with names you do not recognize  -try to avoid foods/beverages  with added sugar or sweeteners/sweets  -try to avoid sweet drinks (including diet drinks): soda, juice, Gatorade, sweet tea, power drinks, diet drinks -try to avoid white rice, white bread, pasta (unless whole grain)  EXERCISE GUIDELINES FOR ADULTS: -if you wish to increase your physical activity, do so gradually and with the approval of your doctor -STOP and seek medical care immediately if you have any chest pain, chest discomfort or trouble breathing when starting or  increasing exercise  -move and stretch  your body, legs, feet and arms when sitting for long periods -Physical activity guidelines for optimal health in adults: -get at least 150 minutes per week of moderate exercise (can talk, but not sing); this is about 20-30 minutes of sustained activity 5-7 days per week or two 10-15 minute episodes of sustained activity 5-7 days per week -do some muscle building/resistance training/strength training at least 2 days per week  -balance exercises 3+ days per week:   Stand somewhere where you have something sturdy to hold onto if you lose balance    1) lift up on toes, then back down, start with 5x per day and work up to 20x   2) stand and lift one leg straight out to the side so that foot is a few inches of the floor, start with 5x each side and work up to 20x each side   3) stand on one foot, start with 5 seconds each side and work up to 20 seconds on each side  If you need ideas or help with getting more active:  -Silver sneakers https://tools.silversneakers.com  -Walk with a Doc: http://www.duncan-williams.com/  -try to include resistance (weight lifting/strength building) and balance exercises twice per week: or the following link for ideas: http://castillo-powell.com/  BuyDucts.dk  STRESS MANAGEMENT: -can try meditating, or just sitting quietly with deep breathing while intentionally relaxing all parts of your body for 5 minutes daily -if you need further help with stress, anxiety or depression please follow up with your primary doctor or contact the wonderful folks at [facility]: [phone]  SOCIAL CONNECTIONS: -options in [place] if you wish to engage in more social and exercise related activities:  -Silver sneakers https://tools.silversneakers.com  -Walk with a Doc: http://www.duncan-williams.com/  -Check out the [facility] Active Adults 50+  section on the [place] of [employer] (hiking clubs, book clubs, cards and games, chess, exercise classes, aquatic classes and much more) - see the website for details: https://www.[place].gov/departments/parks-recreation/active-adults50  -YouTube has lots of exercise videos for different ages and abilities as well  -[facility] (a variety of indoor and outdoor inperson activities for adults). [phone]. [address]  -Virtual Online Classes (a variety of topics): see seniorplanet.org or call [phone]  -consider volunteering at a school, hospice center, church, senior center or elsewhere   ADVANCED HEALTHCARE DIRECTIVES:  [place] Advanced Directives assistance:   ExpressWeek.com.cy  Everyone should have advanced health care directives in place. This is so that you get the care you want, should you ever be in a situation where you are unable to make your own medical decisions.   From the [place] Advanced Directive Website: Advance Health Care Directives are legal documents in which you give written instructions about your health care if, in the future, you cannot speak for yourself.   A health care power of attorney allows you to name a person you trust to make your health care decisions if you cannot make them yourself. A declaration of a desire for a natural death (or living will) is document, which states that you desire not to have your life prolonged by extraordinary measures if you have a terminal or incurable illness or if you are in a vegetative state. An advance instruction for mental health treatment makes a declaration of instructions, information and preferences regarding your mental health treatment. It also states that you are aware that the advance instruction authorizes a mental health treatment provider to act according to your wishes. It may also outline your consent or refusal of  mental  health treatment. A declaration of an anatomical gift allows anyone over the age of 13 to make a gift by will, organ donor card or other document.   Please see the following website or an elder law attorney for forms, FAQs and for completion of advanced directives: [place]  Print production planner Health Care Directives Advance Health Care Directives (http://guzman.[email or web address]/)  Or copy and paste the following to your web browser: PoshChat.fi

## 2023-11-27 ENCOUNTER — Encounter: Payer: Self-pay | Admitting: Adult Health

## 2023-12-14 ENCOUNTER — Other Ambulatory Visit: Payer: Self-pay | Admitting: Adult Health

## 2023-12-14 DIAGNOSIS — F5101 Primary insomnia: Secondary | ICD-10-CM

## 2023-12-15 ENCOUNTER — Encounter: Payer: Self-pay | Admitting: Adult Health

## 2023-12-16 ENCOUNTER — Other Ambulatory Visit: Payer: Self-pay

## 2023-12-16 DIAGNOSIS — F5101 Primary insomnia: Secondary | ICD-10-CM

## 2023-12-16 MED ORDER — ZOLPIDEM TARTRATE 10 MG PO TABS: 10.0000 mg | ORAL_TABLET | Freq: Every evening | ORAL | 2 refills | Status: DC | PRN

## 2023-12-16 NOTE — Telephone Encounter (Signed)
 Medication: Ambien  Directions: Take 1 tablet (10 mg total) by mouth at bedtime as needed.  Last given: 09/16/2023 Number refills: 2 Last o/v:  Follow up:  Labs:    Please review refill request. Refill pended to this encounter.

## 2024-01-05 ENCOUNTER — Encounter: Payer: Self-pay | Admitting: Radiology

## 2024-01-07 DIAGNOSIS — Z1211 Encounter for screening for malignant neoplasm of colon: Secondary | ICD-10-CM | POA: Diagnosis not present

## 2024-01-07 DIAGNOSIS — K5900 Constipation, unspecified: Secondary | ICD-10-CM | POA: Diagnosis not present

## 2024-01-22 DIAGNOSIS — D124 Benign neoplasm of descending colon: Secondary | ICD-10-CM | POA: Diagnosis not present

## 2024-01-22 DIAGNOSIS — D123 Benign neoplasm of transverse colon: Secondary | ICD-10-CM | POA: Diagnosis not present

## 2024-01-22 DIAGNOSIS — D125 Benign neoplasm of sigmoid colon: Secondary | ICD-10-CM | POA: Diagnosis not present

## 2024-01-22 DIAGNOSIS — K640 First degree hemorrhoids: Secondary | ICD-10-CM | POA: Diagnosis not present

## 2024-01-22 DIAGNOSIS — K5730 Diverticulosis of large intestine without perforation or abscess without bleeding: Secondary | ICD-10-CM | POA: Diagnosis not present

## 2024-01-22 DIAGNOSIS — Z1211 Encounter for screening for malignant neoplasm of colon: Secondary | ICD-10-CM | POA: Diagnosis not present

## 2024-01-22 DIAGNOSIS — D122 Benign neoplasm of ascending colon: Secondary | ICD-10-CM | POA: Diagnosis not present

## 2024-01-22 LAB — HM COLONOSCOPY

## 2024-01-26 DIAGNOSIS — D125 Benign neoplasm of sigmoid colon: Secondary | ICD-10-CM | POA: Diagnosis not present

## 2024-01-26 DIAGNOSIS — D124 Benign neoplasm of descending colon: Secondary | ICD-10-CM | POA: Diagnosis not present

## 2024-01-26 DIAGNOSIS — D123 Benign neoplasm of transverse colon: Secondary | ICD-10-CM | POA: Diagnosis not present

## 2024-01-26 DIAGNOSIS — D122 Benign neoplasm of ascending colon: Secondary | ICD-10-CM | POA: Diagnosis not present

## 2024-02-02 DIAGNOSIS — H2513 Age-related nuclear cataract, bilateral: Secondary | ICD-10-CM | POA: Diagnosis not present

## 2024-02-02 DIAGNOSIS — H353131 Nonexudative age-related macular degeneration, bilateral, early dry stage: Secondary | ICD-10-CM | POA: Diagnosis not present

## 2024-02-02 DIAGNOSIS — H40023 Open angle with borderline findings, high risk, bilateral: Secondary | ICD-10-CM | POA: Diagnosis not present

## 2024-02-02 DIAGNOSIS — H40053 Ocular hypertension, bilateral: Secondary | ICD-10-CM | POA: Diagnosis not present

## 2024-02-09 ENCOUNTER — Telehealth: Payer: Self-pay | Admitting: Adult Health

## 2024-02-10 ENCOUNTER — Ambulatory Visit: Admitting: Family Medicine

## 2024-02-10 ENCOUNTER — Encounter: Payer: Self-pay | Admitting: Family Medicine

## 2024-02-10 VITALS — BP 120/70 | HR 80 | Temp 97.9°F | Resp 16 | Ht 68.0 in | Wt 182.0 lb

## 2024-02-10 DIAGNOSIS — R051 Acute cough: Secondary | ICD-10-CM | POA: Diagnosis not present

## 2024-02-10 DIAGNOSIS — J310 Chronic rhinitis: Secondary | ICD-10-CM

## 2024-02-10 MED ORDER — BENZONATATE 100 MG PO CAPS: 100.0000 mg | ORAL_CAPSULE | Freq: Two times a day (BID) | ORAL | 0 refills | Status: AC | PRN

## 2024-02-10 NOTE — Progress Notes (Signed)
 ACUTE VISIT Chief Complaint  Patient presents with   Cough    Uncontrollable cough for 6 days    Discussed the use of AI scribe software for clinical note transcription with the patient, who gave verbal consent to proceed. History of Present Illness Harold Wilson. is a 78 year old male with past medical history significant for hypertension, OA, BPH, ADHD, and hyperlipidemia, who is here today complaining of cough.  He has been experiencing a persistent cough for the past six days, described as severe and leading to episode of vomiting x 1 due to coughing spell. The patient denies associated fever, chills, hemoptysis, dyspnea, wheezing, heartburn, or acid reflux. He also has a frontal headache during coughing spells.   He experiences post-nasal drainage, rhinorrhea, and nasal congestion, accompanied by a slight sore throat.  He has a history of wheezing and has used albuterol  inhaler a couple of times for this symptom in the past but not recently.   He has been using cough drops and sore throat spray, but finds them ineffective.  No recent travel or exposure to sick individuals and tested negative for COVID-19 recently.  He does not smoke.  Review of Systems  Constitutional:  Negative for activity change and appetite change.  HENT:  Negative for facial swelling and sinus pain.   Eyes:  Negative for discharge and redness.  Cardiovascular:  Negative for chest pain, palpitations and leg swelling.  Gastrointestinal:  Negative for abdominal pain and nausea.  Skin:  Negative for rash.  Neurological:  Negative for syncope, facial asymmetry and weakness.  See other pertinent positives and negatives in HPI.  Current Outpatient Medications on File Prior to Visit  Medication Sig Dispense Refill   albuterol  (VENTOLIN  HFA) 108 (90 Base) MCG/ACT inhaler Inhale 2 puffs into the lungs every 6 (six) hours as needed for wheezing or shortness of breath. 8 g 0   amLODipine  (NORVASC ) 10 MG  tablet TAKE 1 TABLET BY MOUTH DAILY 30 tablet 5   brimonidine (ALPHAGAN) 0.2 % ophthalmic solution      cyclobenzaprine  (FLEXERIL ) 5 MG tablet TAKE ONE TABLET BY MOUTH THREE TIMES A DAY AS NEEDED FOR MUSCLE SPASMS 15 tablet 0   diclofenac  (VOLTAREN ) 75 MG EC tablet Take 1 tablet (75 mg total) by mouth 2 (two) times daily as needed. 60 tablet 2   FLUoxetine  (PROZAC ) 40 MG capsule Take 1 capsule (40 mg total) by mouth daily. 90 capsule 1   fluticasone  (FLONASE ) 50 MCG/ACT nasal spray Place 2 sprays into both nostrils daily. 48 g 2   gabapentin  (NEURONTIN ) 100 MG capsule Take 1 capsule (100 mg total) by mouth 3 (three) times daily as needed. 30 capsule 2   lisinopril  (ZESTRIL ) 40 MG tablet Take 1 tablet (40 mg total) by mouth daily. 90 tablet 3   tadalafil  (CIALIS ) 5 MG tablet Take 1 tablet (5 mg total) by mouth daily. 90 tablet 3   tamsulosin  (FLOMAX ) 0.4 MG CAPS capsule Take 1 capsule (0.4 mg total) by mouth daily. 90 capsule 3   traMADol  (ULTRAM ) 50 MG tablet TAKE 1 TO 2 TABLETS BY MOUTH DAILY AS NEEDED 20 tablet 2   zolpidem  (AMBIEN ) 10 MG tablet Take 1 tablet (10 mg total) by mouth at bedtime as needed. 30 tablet 2   No current facility-administered medications on file prior to visit.    Past Medical History:  Diagnosis Date   BENIGN PROSTATIC HYPERTROPHY 02/06/2007   COLONIC POLYPS, HX OF 02/06/2007   COVID  09/18/2020   per pt   DIVERTICULOSIS, COLON 02/06/2007   ERECTILE DYSFUNCTION, NON-ORGANIC, MILD 02/23/2009   HYPERLIPIDEMIA 02/22/2008   Hypertension    OSTEOARTHRITIS 02/06/2007   Allergies  Allergen Reactions   Nickel Rash   Sulfacetamide Sodium     Dryness of mouth     Social History   Socioeconomic History   Marital status: Married    Spouse name: Not on file   Number of children: 5   Years of education: Not on file   Highest education level: GED or equivalent  Occupational History   Occupation: retired  Tobacco Use   Smoking status: Former   Smokeless  tobacco: Never  Vaping Use   Vaping status: Never Used  Substance and Sexual Activity   Alcohol use: Yes    Alcohol/week: 1.0 standard drink of alcohol    Types: 1 Cans of beer per week   Drug use: No   Sexual activity: Not on file  Other Topics Concern   Not on file  Social History Narrative   Retired    Married    Social Drivers of Corporate Investment Banker Strain: Medium Risk (08/19/2023)   Overall Financial Resource Strain (CARDIA)    Difficulty of Paying Living Expenses: Somewhat hard  Food Insecurity: No Food Insecurity (08/19/2023)   Hunger Vital Sign    Worried About Running Out of Food in the Last Year: Never true    Ran Out of Food in the Last Year: Never true  Transportation Needs: No Transportation Needs (08/19/2023)   PRAPARE - Administrator, Civil Service (Medical): No    Lack of Transportation (Non-Medical): No  Physical Activity: Sufficiently Active (08/19/2023)   Exercise Vital Sign    Days of Exercise per Week: 7 days    Minutes of Exercise per Session: 30 min  Stress: Stress Concern Present (08/19/2023)   [employer] of Occupational Health - Occupational Stress Questionnaire    Feeling of Stress: Rather much  Social Connections: Moderately Isolated (08/19/2023)   Social Connection and Isolation Panel    Frequency of Communication with Friends and Family: More than three times a week    Frequency of Social Gatherings with Friends and Family: More than three times a week    Attends Religious Services: Never    Database Administrator or Organizations: No    Attends Banker Meetings: Not on file    Marital Status: Married   Vitals:   02/10/24 1335  BP: 120/70  Pulse: 80  Resp: 16  Temp: 97.9 F (36.6 C)  SpO2: 98%   Body mass index is 27.67 kg/m.  Physical Exam Vitals and nursing note reviewed.  Constitutional:      General: He is not in acute distress.    Appearance: He is well-developed. He is not ill-appearing.   HENT:     Head: Normocephalic and atraumatic.     Nose: Rhinorrhea present. No congestion.     Right Turbinates: Enlarged.     Left Turbinates: Enlarged.     Right Sinus: No maxillary sinus tenderness or frontal sinus tenderness.     Left Sinus: No maxillary sinus tenderness or frontal sinus tenderness.     Mouth/Throat:     Mouth: Mucous membranes are moist.  Eyes:     Conjunctiva/sclera: Conjunctivae normal.  Cardiovascular:     Rate and Rhythm: Normal rate and regular rhythm.     Heart sounds: No murmur heard. Pulmonary:  Effort: Pulmonary effort is normal. No respiratory distress.     Breath sounds: Normal breath sounds. No stridor.     Comments: Nonproductive cough a couple times during the visit. Lymphadenopathy:     Cervical: No cervical adenopathy.  Skin:    General: Skin is warm.     Findings: No erythema or rash.  Neurological:     General: No focal deficit present.     Mental Status: He is alert and oriented to person, place, and time.     Gait: Gait normal.  Psychiatric:        Mood and Affect: Mood and affect normal.   ASSESSMENT AND PLAN:  Mr. Harold Wilson was seen today for cough.  Diagnoses and all orders for this visit:  Acute cough Productive cough that is started about 6 days ago with no associated wheezing or dyspnea. We discussed possible etiologies,?  Allergic versus viral. Lung auscultation today is negative and symptoms do not suggest a serious process, so we decided to hold on chest x-ray. Monitor for new symptoms. Plain Mucinex  may help. Benzonatate  recommended to help with symptoms. Instructed about warning signs.  -     benzonatate  (TESSALON ) 100 MG capsule; Take 1 capsule (100 mg total) by mouth 2 (two) times daily as needed for up to 10 days.  Rhinitis, unspecified type ?  Allergy. He has Flonase  nasal spray at home, recommend using it at night for 10 to 14 days then as needed. Nasal saline irrigations as needed throughout the  day. Follow-up with PCP as needed.  Return if symptoms worsen or fail to improve.  Candas Deemer G. Isidor Bromell, MD  [facility]. [place] office.

## 2024-02-10 NOTE — Patient Instructions (Addendum)
 A few things to remember from today's visit:  Acute cough - Plan: benzonatate  (TESSALON ) 100 MG capsule  Rhinitis, unspecified type  Plain Mucinex  may help with mucus. Nasal saline irrigations. Flonase  nasal spray daily at bedtime for 10-14 days then as needed. Monitor for new symptoms.  If you need refills for medications you take chronically, please call your pharmacy. Do not use My Chart to request refills or for acute issues that need immediate attention. If you send a my chart message, it may take a few days to be addressed, specially if I am not in the office.  Please be sure medication list is accurate. If a new problem present, please set up appointment sooner than planned today.

## 2024-03-11 ENCOUNTER — Encounter: Payer: Self-pay | Admitting: Adult Health

## 2024-03-16 ENCOUNTER — Other Ambulatory Visit: Payer: Self-pay | Admitting: Adult Health

## 2024-03-16 DIAGNOSIS — F5101 Primary insomnia: Secondary | ICD-10-CM

## 2024-03-16 NOTE — Telephone Encounter (Signed)
 Okay for refill?

## 2024-04-04 ENCOUNTER — Other Ambulatory Visit: Payer: Self-pay | Admitting: Cardiovascular Disease
